# Patient Record
Sex: Female | Born: 1945 | Race: Black or African American | Hispanic: No | Marital: Single | Smoking: Former smoker
Health system: Southern US, Community
[De-identification: ages and names within clinical notes are randomized; demographics above are authoritative.]

## PROBLEM LIST (undated history)

## (undated) DIAGNOSIS — M109 Gout, unspecified: Secondary | ICD-10-CM

## (undated) DIAGNOSIS — R569 Unspecified convulsions: Secondary | ICD-10-CM

## (undated) DIAGNOSIS — H409 Unspecified glaucoma: Secondary | ICD-10-CM

## (undated) DIAGNOSIS — E119 Type 2 diabetes mellitus without complications: Secondary | ICD-10-CM

## (undated) DIAGNOSIS — G709 Myoneural disorder, unspecified: Secondary | ICD-10-CM

## (undated) DIAGNOSIS — Z8619 Personal history of other infectious and parasitic diseases: Secondary | ICD-10-CM

## (undated) DIAGNOSIS — C801 Malignant (primary) neoplasm, unspecified: Secondary | ICD-10-CM

## (undated) DIAGNOSIS — G808 Other cerebral palsy: Secondary | ICD-10-CM

## (undated) DIAGNOSIS — I10 Essential (primary) hypertension: Secondary | ICD-10-CM

## (undated) DIAGNOSIS — Z9289 Personal history of other medical treatment: Secondary | ICD-10-CM

## (undated) DIAGNOSIS — M1990 Unspecified osteoarthritis, unspecified site: Secondary | ICD-10-CM

## (undated) HISTORY — PX: TUBAL LIGATION: SHX77

## (undated) HISTORY — PX: EYE SURGERY: SHX253

## (undated) HISTORY — PX: BREAST SURGERY: SHX581

## (undated) HISTORY — PX: CHOLECYSTECTOMY: SHX55

## (undated) HISTORY — PX: OTHER SURGICAL HISTORY: SHX169

## (undated) HISTORY — DX: Unspecified convulsions: R56.9

---

## 2002-09-08 ENCOUNTER — Emergency Department (HOSPITAL_COMMUNITY): Admission: EM | Admit: 2002-09-08 | Discharge: 2002-09-08 | Payer: Self-pay | Admitting: Emergency Medicine

## 2004-09-26 ENCOUNTER — Emergency Department (HOSPITAL_COMMUNITY): Admission: EM | Admit: 2004-09-26 | Discharge: 2004-09-26 | Payer: Self-pay | Admitting: Family Medicine

## 2005-03-20 ENCOUNTER — Ambulatory Visit (HOSPITAL_COMMUNITY): Admission: RE | Admit: 2005-03-20 | Discharge: 2005-03-20 | Payer: Self-pay | Admitting: Family Medicine

## 2005-03-20 ENCOUNTER — Emergency Department (HOSPITAL_COMMUNITY): Admission: EM | Admit: 2005-03-20 | Discharge: 2005-03-20 | Payer: Self-pay | Admitting: Family Medicine

## 2005-04-26 ENCOUNTER — Emergency Department (HOSPITAL_COMMUNITY): Admission: EM | Admit: 2005-04-26 | Discharge: 2005-04-26 | Payer: Self-pay | Admitting: Family Medicine

## 2006-12-01 ENCOUNTER — Emergency Department (HOSPITAL_COMMUNITY): Admission: EM | Admit: 2006-12-01 | Discharge: 2006-12-01 | Payer: Self-pay | Admitting: Emergency Medicine

## 2007-01-03 ENCOUNTER — Other Ambulatory Visit: Payer: Self-pay | Admitting: Emergency Medicine

## 2007-01-03 ENCOUNTER — Encounter (INDEPENDENT_AMBULATORY_CARE_PROVIDER_SITE_OTHER): Payer: Self-pay | Admitting: Otolaryngology

## 2007-01-03 ENCOUNTER — Emergency Department (HOSPITAL_COMMUNITY): Admission: EM | Admit: 2007-01-03 | Discharge: 2007-01-03 | Payer: Self-pay | Admitting: Emergency Medicine

## 2010-06-19 ENCOUNTER — Other Ambulatory Visit: Payer: Self-pay | Admitting: Internal Medicine

## 2010-06-19 DIAGNOSIS — I890 Lymphedema, not elsewhere classified: Secondary | ICD-10-CM

## 2010-06-21 ENCOUNTER — Ambulatory Visit
Admission: RE | Admit: 2010-06-21 | Discharge: 2010-06-21 | Disposition: A | Discharge status: Home / Self Care | Payer: Medicare Other | Source: Ambulatory Visit | Attending: Internal Medicine | Admitting: Internal Medicine

## 2010-06-21 DIAGNOSIS — I890 Lymphedema, not elsewhere classified: Secondary | ICD-10-CM

## 2010-08-07 NOTE — Consult Note (Signed)
NAME:  Donna Becker, Donna Becker NO.:  [id number]   MEDICAL RECORD NO.:  [id number]          PATIENT TYPE:  OUT   LOCATION:  CATS                         FACILITY:  [facility]   PHYSICIAN:  Zola Button T. Lazarus Salines, M.D. DATE OF BIRTH:  09-Jun-1945   DATE OF CONSULTATION:  01/03/2007  DATE OF DISCHARGE:                                 CONSULTATION   CHIEF COMPLAINT:  Palatal mass.   HISTORY:  This 65 year old black female noticed a small lump come up on  the roof of her mouth may be greater than 1 week ago.  It has gotten  gradually larger.  It is quite tender.  It feels soft.  Yesterday or  today, she has some sense that it might have popped to some degree.  It  is quite painful, but no fever.  No nasal drainage.  She has noticed  some pain and looseness in her left maxillary teeth.  Prior to this, she  has had multiple dental reconstructions but nothing currently actively  diseased.  No numbness on her face.  No vision or hearing problems.  No  fever.  She is diabetic but has been under good control in the mid 100  range.  No trauma to the area.   PHYSICAL EXAMINATION:  GENERAL:  This is a pleasant middle-aged black  female who looks somewhat uncomfortable.  She has a strong anaerobic  smell to her breath.  NEUROLOGIC:  Mental status seems appropriate.  She hears well in  conversational speech.  Voice is clear and respirations unlabored  through the nose.  The head is atraumatic and neck supple.  Cranial  nerves grossly intact.  HEENT:  Ear canals are clear with normal aerated drums.  The internal  nose is mildly dry, but no crusting drainage or masses.  The oral cavity  reveals multiple teeth with reconstructions.  There is a slightly  lobulated 2 x 1.5 cm hemispherical fluctuant fullness in the midline  hard palate, more towards the left than the right.  There is no active  drainage, although there is some surface excoriation probably from the  prior attempt at needle aspiration.   Interestingly, the entire left  maxillary alveolus up to the level of the canine teeth is mobile.  Oropharynx is clear.  I did not examine nasopharynx or hypopharynx.  NECK:  Unremarkable.   IMPRESSION:  1. Midline palatal fluctuant mass with anaerobic odor and mobile left      maxillary alveolus suggesting some sort of infection but a concern      for possible maxillary tumor.  2. Diabetes.  3. Hypertension.   PLAN:  We will check a CBC with differential, BMET 7.  We will check and  axial plus coronal CT of the paranasal sinuses now to better assess the  limits of this process.  It will probably require incision and drainage,  possibly more than that.      Gloris Manchester. Lazarus Salines, M.D.  Electronically Signed     KTW/MEDQ  D:  01/03/2007  T:  01/04/2007  Job:  [id number]

## 2010-08-07 NOTE — Op Note (Signed)
NAME:  Donna Becker, Donna Becker NO.:  [id number]   MEDICAL RECORD NO.:  [id number]          PATIENT TYPE:  OUT   LOCATION:  CATS                         FACILITY:  [facility]   PHYSICIAN:  Zola Button T. Lazarus Salines, M.D. DATE OF BIRTH:  Nov 24, 1945   DATE OF PROCEDURE:  01/03/2007  DATE OF DISCHARGE:  01/03/2007                               OPERATIVE REPORT   PREOPERATIVE DIAGNOSIS:  1. Scarred  palate.  2. Dental abscess.   POSTOPERATIVE DIAGNOSES:  1. Scarred  palate.  2. Dental abscess.   PROCEDURE PERFORMED:  Incision and drainage/incisional biopsy, hard  palate cystic structure   SURGEON:  Karol T. Lazarus Salines, M.D.   ANESTHESIA:  Local.   BLOOD LOSS:  Minimal.   COMPLICATIONS:  None.   FINDINGS:  A roughly 1.5 x 2 cm fluctuant, soft swelling in the anterior  hard palate submucosally, predominately to the left of midline.  On  incision, a sheet cavity with some flecks of pus expressed in the region  of the alveolus but no frank drainage of pus.  Cultures taken for  routine culture.  The wedge incisional biopsy for histopathology.   DESCRIPTION OF PROCEDURE:  With the patient in the comfortable semi-  sitting position, topical anesthesia was achieved with Cetacaine spray  x2.  Following this, 1% Xylocaine with 1:100,000 epinephrine, 4 mL total  was infiltrated submucosally.  Several minutes were allowed for this to  take effect.  The cavity was incised with the 15 blade. A 1 cm incision  and some blood but no free pus identified.  With expression near the  alveolus, small flecks of pus were evacuated.  Culture was placed in the  cavity.  Small wedge biopsy of the edge of the in incision was taken for  routine histopathology.  Hemostasis was spontaneous.  The patient  tolerated the procedure nicely.   She will receive 900 mg of IV clindamycin in the ER and I will send her  home with a prescription for clindamycin 300 mg t.i.d. and Vicodin for  pain relief.  She needs to  see a dentist in the immediate future.  Donna Becker, D.D.S.  is on call for the dental service per the emergency room.  I will see  her back if needed in my office.  She understands and agrees.   Cc:  Dr. Alesia Banda DDS      Gloris Manchester. Lazarus Salines, M.D.  Electronically Signed     KTW/MEDQ  D:  01/03/2007  T:  01/05/2007  Job:  [id number]   cc:   Donna Hacking. Becker, D.D.S.

## 2010-12-27 ENCOUNTER — Emergency Department (HOSPITAL_COMMUNITY): Payer: Medicare Other

## 2010-12-27 ENCOUNTER — Inpatient Hospital Stay (HOSPITAL_COMMUNITY)
Admission: EM | Admit: 2010-12-27 | Discharge: 2011-01-04 | DRG: 558 | Disposition: A | Discharge status: Rehabilitation Facility | Payer: Medicare Other | Source: Ambulatory Visit | Attending: Internal Medicine | Admitting: Internal Medicine

## 2010-12-27 DIAGNOSIS — Z9010 Acquired absence of unspecified breast and nipple: Secondary | ICD-10-CM

## 2010-12-27 DIAGNOSIS — R4789 Other speech disturbances: Secondary | ICD-10-CM | POA: Diagnosis present

## 2010-12-27 DIAGNOSIS — Z886 Allergy status to analgesic agent status: Secondary | ICD-10-CM

## 2010-12-27 DIAGNOSIS — Z7902 Long term (current) use of antithrombotics/antiplatelets: Secondary | ICD-10-CM

## 2010-12-27 DIAGNOSIS — Z9181 History of falling: Secondary | ICD-10-CM

## 2010-12-27 DIAGNOSIS — Z833 Family history of diabetes mellitus: Secondary | ICD-10-CM

## 2010-12-27 DIAGNOSIS — I959 Hypotension, unspecified: Secondary | ICD-10-CM | POA: Diagnosis not present

## 2010-12-27 DIAGNOSIS — M109 Gout, unspecified: Secondary | ICD-10-CM | POA: Diagnosis present

## 2010-12-27 DIAGNOSIS — E785 Hyperlipidemia, unspecified: Secondary | ICD-10-CM | POA: Diagnosis present

## 2010-12-27 DIAGNOSIS — G40909 Epilepsy, unspecified, not intractable, without status epilepticus: Secondary | ICD-10-CM | POA: Diagnosis present

## 2010-12-27 DIAGNOSIS — R1310 Dysphagia, unspecified: Secondary | ICD-10-CM | POA: Diagnosis present

## 2010-12-27 DIAGNOSIS — G809 Cerebral palsy, unspecified: Secondary | ICD-10-CM | POA: Diagnosis present

## 2010-12-27 DIAGNOSIS — I10 Essential (primary) hypertension: Secondary | ICD-10-CM | POA: Diagnosis present

## 2010-12-27 DIAGNOSIS — Z947 Corneal transplant status: Secondary | ICD-10-CM

## 2010-12-27 DIAGNOSIS — Z8249 Family history of ischemic heart disease and other diseases of the circulatory system: Secondary | ICD-10-CM

## 2010-12-27 DIAGNOSIS — M75120 Complete rotator cuff tear or rupture of unspecified shoulder, not specified as traumatic: Principal | ICD-10-CM | POA: Diagnosis present

## 2010-12-27 DIAGNOSIS — M25519 Pain in unspecified shoulder: Secondary | ICD-10-CM | POA: Diagnosis present

## 2010-12-27 DIAGNOSIS — Z79899 Other long term (current) drug therapy: Secondary | ICD-10-CM

## 2010-12-27 DIAGNOSIS — E119 Type 2 diabetes mellitus without complications: Secondary | ICD-10-CM | POA: Diagnosis present

## 2010-12-27 DIAGNOSIS — Z48298 Encounter for aftercare following other organ transplant: Secondary | ICD-10-CM

## 2010-12-27 DIAGNOSIS — Z7982 Long term (current) use of aspirin: Secondary | ICD-10-CM

## 2010-12-27 DIAGNOSIS — Z853 Personal history of malignant neoplasm of breast: Secondary | ICD-10-CM

## 2010-12-27 DIAGNOSIS — G8190 Hemiplegia, unspecified affecting unspecified side: Secondary | ICD-10-CM | POA: Diagnosis present

## 2010-12-27 LAB — CBC
HCT: 33.9 % — ABNORMAL LOW (ref 36.0–46.0)
Hemoglobin: 11.4 g/dL — ABNORMAL LOW (ref 12.0–15.0)
MCH: 31.1 pg (ref 26.0–34.0)
MCHC: 33.6 g/dL (ref 30.0–36.0)
MCV: 92.6 fL (ref 78.0–100.0)
Platelets: 180 10*3/uL (ref 150–400)
RBC: 3.66 MIL/uL — ABNORMAL LOW (ref 3.87–5.11)
RDW: 13.5 % (ref 11.5–15.5)
WBC: 6 10*3/uL (ref 4.0–10.5)

## 2010-12-27 LAB — COMPREHENSIVE METABOLIC PANEL
ALT: 26 U/L (ref 0–35)
AST: 24 U/L (ref 0–37)
Albumin: 3.8 g/dL (ref 3.5–5.2)
Alkaline Phosphatase: 113 U/L (ref 39–117)
BUN: 32 mg/dL — ABNORMAL HIGH (ref 6–23)
CO2: 23 mEq/L (ref 19–32)
Calcium: 9.2 mg/dL (ref 8.4–10.5)
Chloride: 105 mEq/L (ref 96–112)
Creatinine, Ser: 0.81 mg/dL (ref 0.50–1.10)
GFR calc Af Amer: 86 mL/min — ABNORMAL LOW (ref >90)
GFR calc non Af Amer: 74 mL/min — ABNORMAL LOW (ref >90)
Glucose, Bld: 93 mg/dL (ref 70–99)
Potassium: 4.3 mEq/L (ref 3.5–5.1)
Sodium: 139 mEq/L (ref 135–145)
Total Bilirubin: 0.2 mg/dL — ABNORMAL LOW (ref 0.3–1.2)
Total Protein: 7.9 g/dL (ref 6.0–8.3)

## 2010-12-27 LAB — CK TOTAL AND CKMB (NOT AT ARMC)
CK, MB: 3 ng/mL (ref 0.3–4.0)
Relative Index: 2.6 — ABNORMAL HIGH (ref 0.0–2.5)
Total CK: 116 U/L (ref 7–177)

## 2010-12-27 LAB — PROTIME-INR
INR: 1.06 (ref 0.00–1.49)
Prothrombin Time: 14 seconds (ref 11.6–15.2)

## 2010-12-27 LAB — TROPONIN I: Troponin I: <0.3 ng/mL (ref <0.30)

## 2010-12-27 LAB — DIFFERENTIAL
Basophils Absolute: 0 10*3/uL (ref 0.0–0.1)
Basophils Relative: 0 % (ref 0–1)
Eosinophils Absolute: 0.2 10*3/uL (ref 0.0–0.7)
Eosinophils Relative: 3 % (ref 0–5)
Lymphocytes Relative: 33 % (ref 12–46)
Lymphs Abs: 2 10*3/uL (ref 0.7–4.0)
Monocytes Absolute: 0.5 10*3/uL (ref 0.1–1.0)
Monocytes Relative: 8 % (ref 3–12)
Neutro Abs: 3.3 10*3/uL (ref 1.7–7.7)
Neutrophils Relative %: 56 % (ref 43–77)

## 2010-12-27 LAB — POCT I-STAT, CHEM 8
BUN: 35 mg/dL — ABNORMAL HIGH (ref 6–23)
Calcium, Ion: 1.14 mmol/L (ref 1.12–1.32)
Chloride: 104 mEq/L (ref 96–112)
Creatinine, Ser: 0.9 mg/dL (ref 0.50–1.10)
Glucose, Bld: 96 mg/dL (ref 70–99)
HCT: 38 % (ref 36.0–46.0)
Hemoglobin: 12.9 g/dL (ref 12.0–15.0)
Potassium: 4 mEq/L (ref 3.5–5.1)
Sodium: 143 mEq/L (ref 135–145)
TCO2: 26 mmol/L (ref 0–100)

## 2010-12-27 LAB — CARBAMAZEPINE LEVEL, TOTAL: Carbamazepine Lvl: 6 ug/mL (ref 4.0–12.0)

## 2010-12-27 LAB — APTT: aPTT: 27 seconds (ref 24–37)

## 2010-12-27 LAB — GLUCOSE, CAPILLARY: Glucose-Capillary: 92 mg/dL (ref 70–99)

## 2010-12-28 ENCOUNTER — Inpatient Hospital Stay (HOSPITAL_COMMUNITY): Payer: Medicare Other

## 2010-12-28 DIAGNOSIS — I369 Nonrheumatic tricuspid valve disorder, unspecified: Secondary | ICD-10-CM

## 2010-12-28 LAB — CARDIAC PANEL(CRET KIN+CKTOT+MB+TROPI)
CK, MB: 2.5 ng/mL (ref 0.3–4.0)
Relative Index: INVALID (ref 0.0–2.5)
Total CK: 90 U/L (ref 7–177)
Troponin I: <0.3 ng/mL (ref <0.30)

## 2010-12-28 LAB — GLUCOSE, CAPILLARY
Glucose-Capillary: 131 mg/dL — ABNORMAL HIGH (ref 70–99)
Glucose-Capillary: 116 mg/dL — ABNORMAL HIGH (ref 70–99)
Glucose-Capillary: 127 mg/dL — ABNORMAL HIGH (ref 70–99)
Glucose-Capillary: 123 mg/dL — ABNORMAL HIGH (ref 70–99)

## 2010-12-28 LAB — CBC
HCT: 30.5 % — ABNORMAL LOW (ref 36.0–46.0)
Hemoglobin: 10.1 g/dL — ABNORMAL LOW (ref 12.0–15.0)
MCH: 30.8 pg (ref 26.0–34.0)
MCHC: 33.1 g/dL (ref 30.0–36.0)
MCV: 93 fL (ref 78.0–100.0)
Platelets: 193 10*3/uL (ref 150–400)
RBC: 3.28 MIL/uL — ABNORMAL LOW (ref 3.87–5.11)
RDW: 13.5 % (ref 11.5–15.5)
WBC: 4.2 10*3/uL (ref 4.0–10.5)

## 2010-12-28 LAB — HEMOGLOBIN A1C
Hgb A1c MFr Bld: 6.5 % — ABNORMAL HIGH (ref <5.7)
Mean Plasma Glucose: 140 mg/dL — ABNORMAL HIGH (ref <117)

## 2010-12-28 LAB — COMPREHENSIVE METABOLIC PANEL
ALT: 24 U/L (ref 0–35)
AST: 19 U/L (ref 0–37)
Albumin: 3.5 g/dL (ref 3.5–5.2)
Alkaline Phosphatase: 114 U/L (ref 39–117)
BUN: 27 mg/dL — ABNORMAL HIGH (ref 6–23)
CO2: 28 mEq/L (ref 19–32)
Calcium: 9.6 mg/dL (ref 8.4–10.5)
Chloride: 103 mEq/L (ref 96–112)
Creatinine, Ser: 0.74 mg/dL (ref 0.50–1.10)
GFR calc Af Amer: >90 mL/min (ref >90)
GFR calc non Af Amer: 87 mL/min — ABNORMAL LOW (ref >90)
Glucose, Bld: 127 mg/dL — ABNORMAL HIGH (ref 70–99)
Potassium: 3.5 mEq/L (ref 3.5–5.1)
Sodium: 142 mEq/L (ref 135–145)
Total Bilirubin: 0.3 mg/dL (ref 0.3–1.2)
Total Protein: 7.7 g/dL (ref 6.0–8.3)

## 2010-12-28 LAB — LIPID PANEL
Cholesterol: 150 mg/dL (ref 0–200)
HDL: 47 mg/dL (ref >39)
LDL Cholesterol: 84 mg/dL (ref 0–99)
Total CHOL/HDL Ratio: 3.2 RATIO
Triglycerides: 97 mg/dL (ref <150)
VLDL: 19 mg/dL (ref 0–40)

## 2010-12-28 LAB — PROTIME-INR
INR: 1.09 (ref 0.00–1.49)
Prothrombin Time: 14.3 seconds (ref 11.6–15.2)

## 2010-12-28 LAB — APTT: aPTT: 26 seconds (ref 24–37)

## 2010-12-28 MED ORDER — GADOBENATE DIMEGLUMINE 529 MG/ML IV SOLN
20.0000 mL | Freq: Once | INTRAVENOUS | Status: AC
  Administered 2010-12-28: 20 mL via INTRAVENOUS

## 2010-12-29 ENCOUNTER — Inpatient Hospital Stay (HOSPITAL_COMMUNITY): Payer: Medicare Other

## 2010-12-29 LAB — GLUCOSE, CAPILLARY
Glucose-Capillary: 84 mg/dL (ref 70–99)
Glucose-Capillary: 104 mg/dL — ABNORMAL HIGH (ref 70–99)
Glucose-Capillary: 83 mg/dL (ref 70–99)

## 2010-12-30 ENCOUNTER — Inpatient Hospital Stay (HOSPITAL_COMMUNITY): Payer: Medicare Other

## 2010-12-30 DIAGNOSIS — M7989 Other specified soft tissue disorders: Secondary | ICD-10-CM

## 2010-12-30 LAB — GLUCOSE, CAPILLARY
Glucose-Capillary: 127 mg/dL — ABNORMAL HIGH (ref 70–99)
Glucose-Capillary: 89 mg/dL (ref 70–99)
Glucose-Capillary: 107 mg/dL — ABNORMAL HIGH (ref 70–99)
Glucose-Capillary: 127 mg/dL — ABNORMAL HIGH (ref 70–99)

## 2010-12-30 MED ORDER — IOHEXOL 300 MG/ML  SOLN
75.0000 mL | Freq: Once | INTRAMUSCULAR | Status: AC | PRN
  Administered 2010-12-30: 75 mL via INTRAVENOUS

## 2010-12-31 ENCOUNTER — Inpatient Hospital Stay (HOSPITAL_COMMUNITY): Payer: Medicare Other

## 2010-12-31 LAB — GLUCOSE, CAPILLARY
Glucose-Capillary: 76 mg/dL (ref 70–99)
Glucose-Capillary: 153 mg/dL — ABNORMAL HIGH (ref 70–99)
Glucose-Capillary: 155 mg/dL — ABNORMAL HIGH (ref 70–99)

## 2011-01-01 LAB — FOLATE: Folate: 6.5 ng/mL

## 2011-01-01 LAB — VITAMIN B12: Vitamin B-12: 557 pg/mL (ref 211–911)

## 2011-01-01 LAB — RPR: RPR Ser Ql: NONREACTIVE

## 2011-01-01 LAB — GLUCOSE, CAPILLARY
Glucose-Capillary: 106 mg/dL — ABNORMAL HIGH (ref 70–99)
Glucose-Capillary: 135 mg/dL — ABNORMAL HIGH (ref 70–99)
Glucose-Capillary: 94 mg/dL (ref 70–99)
Glucose-Capillary: 157 mg/dL — ABNORMAL HIGH (ref 70–99)

## 2011-01-01 LAB — TSH: TSH: 3.566 u[IU]/mL (ref 0.350–4.500)

## 2011-01-01 NOTE — Consult Note (Signed)
NAME:  LANEY, LOUDERBACK NO.:  [id number]  MEDICAL RECORD NO.:  [id number]  LOCATION:  3018                         FACILITY:  [facility]  PHYSICIAN:  Marlan Palau, M.D.  DATE OF BIRTH:  [birth year]  DATE OF CONSULTATION:  12/28/2010 DATE OF DISCHARGE:                                CONSULTATION   HISTORY OF PRESENT ILLNESS:  Donna Becker is a 65 year old left- handed black female born 09-04-1945, with a history of cerebral palsy with right hemiparesis.  This patient has diabetes and hypertension as well.  The patient has noted onset of left neck pain and left-sided headache that began 4 days prior to admission.  The patient has had persistent problems with the pain and has noted some difficulty with some slight left-sided weakness.  The patient awakened on the morning of admission noting that the left arm was particularly weak and she had some difficulty walking, but was able to walk to the door to let her sister in.  The patient notes that when she turns her head to the left, she has pain down the left arm to the hand.  The patient has significant pain into the left shoulder and neck area.  The patient believes that the left leg has gotten weaker.  The patient denies any visual field disturbances, although she had some blurring of vision earlier in the week.  The patient was brought in for suspected stroke, but am MRI study of the brain is unremarkable showing only the encephalomalacia in the left posterior parietal and temporal regions associated with her cerebral palsy deficit and MRA evaluation of the head and neck are relatively unremarkable.  Carotid Doppler study is normal.  Neurology was asked to see the patient for further evaluation.  PAST MEDICAL HISTORY:  Significant for: 1. History of cerebral palsy. 2. History of new onset left-sided neck and shoulder pain, some     weakness or pain involving the left arm, possible weakness in the     left  leg.  MRI evaluation negative. 3. Diabetes. 4. Obesity. 5. Hypertension. 6. Multiple procedures of the right arm and right leg for tendon     transfers secondary to cerebral palsy, rule ankle surgery. 7. Breast cancer, status post left mastectomy. 8. Bilateral cataract surgery. 9. Gallbladder resection. 10.Glaucoma. 11.Obesity. 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 13.The patient also has a history of seizures.  The patient has allergy to CODEINE which causes rashes and the patient does not smoke or drink.  CURRENT MEDICATIONS: 1. Allopurinol for gout 100 mg daily. 2. Aspirin 325 mg daily. 3. Tegretol 200 mg 3 times daily. 4. Plavix 75 mg daily. 5. Colchicine 0.6 mg daily. 6. Cyclobenzaprine 10 mg 3 times daily. 7. Gabapentin 300 mg 3 times daily. 8. Glucotrol 10 mg at night. 9. Keppra 500 mg 3 daily. 10.Lisinopril 40 mg daily. 11.Mobic 15 mg daily. 12.Glucophage 1000 mg at night. 13.Metoprolol 100 mg twice daily. 14.Morphine if needed. 15.Simvastatin 20 mg daily.  SOCIAL HISTORY:  This patient is living at home with her mother.  She is not married.  She takes care of her mother who is 58 years old.  The patient lives in  the [place] area.  She is not otherwise employed.  FAMILY MEDICAL HISTORY:  Notable that mother is still living, has a history of degenerative arthritis, mild memory problems.  Father died with dementia.  The patient has one brother and two sisters. K One sister has hypotension and degenerative arthritis and other sister has hypertension.  REVIEW OF SYSTEMS:  Notable for no recent fevers, does have some chills. The patient notes some left-sided headache, neck pain, decreased appetite dizziness, some shortness of breath.  Denies chest pains.  Has had some urinary incontinence problems.  The patient denies any falls or blackout episodes.  PHYSICAL EXAMINATION:  VITAL SIGNS:  Blood pressure is 111/69, heart rate 80, respiratory rate  19, and temperature afebrile. GENERAL:  This patient is a moderately black female who is alert and cooperative at the time of examination. HEENT:  Head is atraumatic.  Eyes, pupils are equal, round, and reactive to light. NECK:  Supple.  No carotid bruits noted. RESPIRATORY:  Clear. CARDIOVASCULAR:  Regular rate and rhythm.  No obvious murmurs or rubs noted. EXTREMITIES:  Without significant edema at the ankles.  The patient has edema of the left arm. NEUROLOGIC:  Cranial nerves as above.  Facial symmetry is present.  The patient has decreased pinprick sensation on the left face as compared to the right.  Does not split midline with vibratory sensation.  Again, extraocular movements are full, visual fields are full.  Speech is normal.  No aphasia or dysarthria noted.  Motor test reveals poor effort with the left arm in part secondary to severe pain with passive and active movement and elevation of the arm.  The patient does have some grip strength, ability to extend, flex at the elbow, has significant pain with elevation of the arm.  The patient has inability to keep the left leg up off the bed, drifts down to the bed, has good abductor strength and abductor strength bilaterally.  Decreased dorsiflexion on left foot as per the right, mild drift noted with the right leg.  The patient was not ambulated.  Deep tendon reflexes are depressed, but symmetric.  Toes are neutral bilaterally.  The patient has fair heel-to- shin bilaterally.  Has difficulty performing finger-nose-finger secondary to pain with the left arm, is able to form on the right.  Some ataxia was noted with the right arm.  Pinprick sensation is decreased on the left arm and legs as compared to the right.  Vibratory sensation is symmetric in the arms, decreased on the right leg as compared to the left in the legs.  LABORATORY VALUES:  Notable for a white count of 6.0, hemoglobin of 11.4, hematocrit 33.9, MCV of 92.6, and  platelets of 180.  INR of 1.06. Sodium of 139, potassium 4.3, chloride of 105, CO2 of 23, glucose of 93, BUN of 32, creatinine 0.81, total bili 0.2, alk phosphatase of 113, SGOT of 24, SGPT of 26, total protein 7.9, albumin of 3.8, calcium 9.2.  CK of 116, MB fraction 3, and troponin I less than 0.3.  Carbamazepine level 6.  MRI of the brain is as above.  IMPRESSION: 1. Onset of left-sided shoulder pain, neck pain, headache, left arm     discomfort, and weakness. 2. History of cerebral palsy with right-sided weakness. 3. History of seizures.  This patient has presentation with headache and pain, notes pain down the left arm with turning her head to the left.  MRI evaluation of the head with MRA of the head is  unremarkable.  No evidence of stroke is noted.  Given the pain syndrome, the patient will be scheduled for a MRI of the cervical spine looking for cervical disk.  The possibility of a left rotator cuff tear also needs to be considered, but it is less likely.  We will follow the patient's clinical course while in-house. Physical and Occupational Therapy will follow the patient.     Marlan Palau, M.D.     CKW/MEDQ  D:  12/28/2010  T:  12/28/2010  Job:  [id number]  cc:   Deirdre Peer. Polite, M.D. [facility] Electronically Signed by Thana Farr M.D. on 01/01/2011 04:54:09 PM

## 2011-01-02 ENCOUNTER — Inpatient Hospital Stay (HOSPITAL_COMMUNITY): Payer: Medicare Other

## 2011-01-02 DIAGNOSIS — G809 Cerebral palsy, unspecified: Secondary | ICD-10-CM

## 2011-01-02 DIAGNOSIS — M7530 Calcific tendinitis of unspecified shoulder: Secondary | ICD-10-CM

## 2011-01-02 DIAGNOSIS — M7989 Other specified soft tissue disorders: Secondary | ICD-10-CM

## 2011-01-02 LAB — GLUCOSE, CAPILLARY
Glucose-Capillary: 81 mg/dL (ref 70–99)
Glucose-Capillary: 92 mg/dL (ref 70–99)
Glucose-Capillary: 138 mg/dL — ABNORMAL HIGH (ref 70–99)
Glucose-Capillary: 258 mg/dL — ABNORMAL HIGH (ref 70–99)

## 2011-01-03 ENCOUNTER — Inpatient Hospital Stay (HOSPITAL_COMMUNITY): Payer: Medicare Other

## 2011-01-03 DIAGNOSIS — R5381 Other malaise: Secondary | ICD-10-CM

## 2011-01-03 DIAGNOSIS — M48061 Spinal stenosis, lumbar region without neurogenic claudication: Secondary | ICD-10-CM

## 2011-01-03 DIAGNOSIS — Z5189 Encounter for other specified aftercare: Secondary | ICD-10-CM

## 2011-01-03 LAB — CBC
HCT: 34.4 — ABNORMAL LOW
Hemoglobin: 11.7 — ABNORMAL LOW
MCHC: 33.9
MCV: 90.4
Platelets: 229
RBC: 3.81 — ABNORMAL LOW
RDW: 13.6
WBC: 10.8 — ABNORMAL HIGH

## 2011-01-03 LAB — CULTURE, ROUTINE-ABSCESS: Culture: NORMAL

## 2011-01-03 LAB — BASIC METABOLIC PANEL
BUN: 13
CO2: 25
Calcium: 8.6
Chloride: 102
Creatinine, Ser: 0.66
GFR calc Af Amer: >60
GFR calc non Af Amer: >60
Glucose, Bld: 96
Potassium: 3.8
Sodium: 138

## 2011-01-03 LAB — GLUCOSE, CAPILLARY
Glucose-Capillary: 54 mg/dL — ABNORMAL LOW (ref 70–99)
Glucose-Capillary: 65 mg/dL — ABNORMAL LOW (ref 70–99)
Glucose-Capillary: 90 mg/dL (ref 70–99)
Glucose-Capillary: 119 mg/dL — ABNORMAL HIGH (ref 70–99)
Glucose-Capillary: 268 mg/dL — ABNORMAL HIGH (ref 70–99)
Glucose-Capillary: 208 mg/dL — ABNORMAL HIGH (ref 70–99)

## 2011-01-03 LAB — DIFFERENTIAL
Basophils Absolute: 0
Basophils Relative: 0
Eosinophils Absolute: 0
Eosinophils Relative: 0
Lymphocytes Relative: 11 — ABNORMAL LOW
Lymphs Abs: 1.2
Monocytes Absolute: 0.7
Monocytes Relative: 6
Neutro Abs: 8.8 — ABNORMAL HIGH
Neutrophils Relative %: 82 — ABNORMAL HIGH

## 2011-01-04 ENCOUNTER — Inpatient Hospital Stay (HOSPITAL_COMMUNITY)
Admission: RE | Admit: 2011-01-04 | Discharge: 2011-01-15 | DRG: 945 | Disposition: A | Discharge status: Home Health | Payer: Medicare Other | Source: Other Acute Inpatient Hospital | Attending: Physical Medicine & Rehabilitation | Admitting: Physical Medicine & Rehabilitation

## 2011-01-04 DIAGNOSIS — I10 Essential (primary) hypertension: Secondary | ICD-10-CM

## 2011-01-04 DIAGNOSIS — E1142 Type 2 diabetes mellitus with diabetic polyneuropathy: Secondary | ICD-10-CM

## 2011-01-04 DIAGNOSIS — Z853 Personal history of malignant neoplasm of breast: Secondary | ICD-10-CM

## 2011-01-04 DIAGNOSIS — I890 Lymphedema, not elsewhere classified: Secondary | ICD-10-CM

## 2011-01-04 DIAGNOSIS — M109 Gout, unspecified: Secondary | ICD-10-CM

## 2011-01-04 DIAGNOSIS — D1779 Benign lipomatous neoplasm of other sites: Secondary | ICD-10-CM

## 2011-01-04 DIAGNOSIS — M67919 Unspecified disorder of synovium and tendon, unspecified shoulder: Secondary | ICD-10-CM

## 2011-01-04 DIAGNOSIS — Z5189 Encounter for other specified aftercare: Principal | ICD-10-CM

## 2011-01-04 DIAGNOSIS — M48061 Spinal stenosis, lumbar region without neurogenic claudication: Secondary | ICD-10-CM

## 2011-01-04 DIAGNOSIS — G40909 Epilepsy, unspecified, not intractable, without status epilepticus: Secondary | ICD-10-CM

## 2011-01-04 DIAGNOSIS — R5381 Other malaise: Secondary | ICD-10-CM

## 2011-01-04 DIAGNOSIS — N390 Urinary tract infection, site not specified: Secondary | ICD-10-CM

## 2011-01-04 DIAGNOSIS — G808 Other cerebral palsy: Secondary | ICD-10-CM

## 2011-01-04 DIAGNOSIS — S8000XA Contusion of unspecified knee, initial encounter: Secondary | ICD-10-CM

## 2011-01-04 DIAGNOSIS — M719 Bursopathy, unspecified: Secondary | ICD-10-CM

## 2011-01-04 DIAGNOSIS — E669 Obesity, unspecified: Secondary | ICD-10-CM

## 2011-01-04 DIAGNOSIS — Z79899 Other long term (current) drug therapy: Secondary | ICD-10-CM

## 2011-01-04 DIAGNOSIS — Z8673 Personal history of transient ischemic attack (TIA), and cerebral infarction without residual deficits: Secondary | ICD-10-CM

## 2011-01-04 DIAGNOSIS — M47812 Spondylosis without myelopathy or radiculopathy, cervical region: Secondary | ICD-10-CM

## 2011-01-04 DIAGNOSIS — Z8261 Family history of arthritis: Secondary | ICD-10-CM

## 2011-01-04 DIAGNOSIS — Z8249 Family history of ischemic heart disease and other diseases of the circulatory system: Secondary | ICD-10-CM

## 2011-01-04 DIAGNOSIS — E1149 Type 2 diabetes mellitus with other diabetic neurological complication: Secondary | ICD-10-CM

## 2011-01-04 DIAGNOSIS — W19XXXA Unspecified fall, initial encounter: Secondary | ICD-10-CM

## 2011-01-04 DIAGNOSIS — Y92009 Unspecified place in unspecified non-institutional (private) residence as the place of occurrence of the external cause: Secondary | ICD-10-CM

## 2011-01-04 DIAGNOSIS — A498 Other bacterial infections of unspecified site: Secondary | ICD-10-CM

## 2011-01-04 DIAGNOSIS — S43429A Sprain of unspecified rotator cuff capsule, initial encounter: Secondary | ICD-10-CM

## 2011-01-04 DIAGNOSIS — Z7982 Long term (current) use of aspirin: Secondary | ICD-10-CM

## 2011-01-04 DIAGNOSIS — Z9010 Acquired absence of unspecified breast and nipple: Secondary | ICD-10-CM

## 2011-01-04 DIAGNOSIS — E1169 Type 2 diabetes mellitus with other specified complication: Secondary | ICD-10-CM

## 2011-01-04 DIAGNOSIS — E785 Hyperlipidemia, unspecified: Secondary | ICD-10-CM

## 2011-01-04 LAB — URINALYSIS, MICROSCOPIC ONLY
Bilirubin Urine: NEGATIVE
Glucose, UA: NEGATIVE mg/dL
Hgb urine dipstick: NEGATIVE
Ketones, ur: NEGATIVE mg/dL
Nitrite: NEGATIVE
Protein, ur: NEGATIVE mg/dL
Specific Gravity, Urine: 1.026 (ref 1.005–1.030)
Urobilinogen, UA: 0.2 mg/dL (ref 0.0–1.0)
pH: 5 (ref 5.0–8.0)

## 2011-01-04 LAB — GLUCOSE, CAPILLARY
Glucose-Capillary: 82 mg/dL (ref 70–99)
Glucose-Capillary: 124 mg/dL — ABNORMAL HIGH (ref 70–99)
Glucose-Capillary: 183 mg/dL — ABNORMAL HIGH (ref 70–99)
Glucose-Capillary: 135 mg/dL — ABNORMAL HIGH (ref 70–99)

## 2011-01-04 NOTE — Consult Note (Signed)
NAME:  Donna, Becker NO.:  [id number]  MEDICAL RECORD NO.:  [id number]  LOCATION:  3018                         FACILITY:  [facility]  PHYSICIAN:  Ollen Gross, M.D.    DATE OF BIRTH:  December 26, 1945  DATE OF CONSULTATION: DATE OF DISCHARGE:                                CONSULTATION   REASON FOR CONSULTATION:  Questionable left rotator cuff tear.  PHYSICIAN REQUESTING CONSULTATION:  Deirdre Peer. Polite, MD  HISTORY OF PRESENT ILLNESS:  Donna Becker is a 65 year old female with a history of cerebral palsy who presented to [facility] on December 27, 2010, with onset of left-sided weakness.  Initially, it was felt that she may have had a cerebrovascular incident.  Her workup including Neurology consultation showed that she did not have a stroke.  She had a fall prior to this, landing on her left side and had shoulder pain since then.  She is complaining of pain in the entire arm, worse in the shoulder, but going down to her hand.  She feels as though the pain is still persistent.  She has had a harder time using her arm.  She says that is "her good arm."  She had cerebral palsy and so the right side is weak.  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  She states that the swelling has not gotten any worse.  She does have some numbness in her hand since this episode began.  MEDICAL HISTORY:  Significant for cerebral palsy, type 2 diabetes, hypertension, seizure disorder, history of cellulitis, history of breast cancer, history of lymphedema left upper extremity, and multiple surgeries in the past for her cerebral palsy.  She also has gout and hyperlipidemia.  ALLERGIES:  She is allergic to CODEINE.  CURRENT MEDICATIONS:  Zyrtec, baby aspirin, multivitamins, colchicine, Flexiril, Travatan ophthalmic drops, fish oil, vitamin D, Neurontin, Mobic, lisinopril, glipizide, Keppra, Pravachol, Tegretol,  Lopressor, metformin, and allopurinol.  PHYSICAL EXAMINATION:  GENERAL:  Very pleasant, well-developed female, alert and oriented, in no apparent distress. EXTREMITIES:  Evaluation of the left upper extremity shows that passively, she is near normal range of motion in the shoulder. Actively, she will elevate about 100, abduct about 100, external rotation about 40.  She has edema throughout her left upper extremity. She has normal motion of her elbow and her wrist.  Pulses are intact. Strength is about 4/5 with a rotator cuff testing.  The right upper extremity is afflicted with the cerebral palsy and the exam reflects that.  PLAIN RADIOGRAPHS:  AP and 3 views of the left shoulder showed no fracture.  She had a type 2 acromion.  She had an MRI scan performed, but is listed left shoulder but all the views suggested this MRI scan was performed on her right shoulder.  The images done on the left inverted, all appeared to be the right shoulder.  There is not much soft tissue swelling noted on that MRI scan and no evidence of any vascular clips which is more evidence that it appears that was taken on the right side and not the left.  That side does show a  small supraspinatus tear with minimal retraction.  There is absolutely no edema around and this was an acute injury, such as what we feel possibly had on the left side, we would definitely see edema which is not present on this MRI.  ASSESSMENT:  Left shoulder pain.  We really need to see if that MRI is of the right shoulder and if so, she will need to have one done of the left shoulder to see if this is an acute injury.  She would not be a good candidate at all for surgery given the chronic lymphedema.  She did have a difficult time healing and be high risk for infection.  If there is an acute tear, however, she may end up getting the surgical treatment.  If indeed the MRI was on the correct side, then this tear that I saw is a very  small one, and she might be able to rehab it instead of having a surgery.  I really do not think that MRI is the left shoulder, so would need to get that clarified with Radiology in the morning.  Once again if there if this MRI was accurate, then the plan will be for physical therapy, possibly doing an injection to help with the acute pain.  If it is not accurate, then she will need to have an MRI done of her left shoulder.     Ollen Gross, M.D.     FA/MEDQ  D:  01/02/2011  T:  01/03/2011  Job:  [id number]  cc:   Deirdre Peer. Polite, M.D.  Electronically Signed by Ollen Gross M.D. on 01/04/2011 04:26:57 PM

## 2011-01-05 DIAGNOSIS — R5381 Other malaise: Secondary | ICD-10-CM

## 2011-01-05 DIAGNOSIS — M48061 Spinal stenosis, lumbar region without neurogenic claudication: Secondary | ICD-10-CM

## 2011-01-05 LAB — GLUCOSE, CAPILLARY
Glucose-Capillary: 98 mg/dL (ref 70–99)
Glucose-Capillary: 135 mg/dL — ABNORMAL HIGH (ref 70–99)
Glucose-Capillary: 132 mg/dL — ABNORMAL HIGH (ref 70–99)

## 2011-01-05 LAB — URINE CULTURE
Colony Count: 25000
Culture  Setup Time: 201210121959

## 2011-01-05 NOTE — H&P (Signed)
NAME:  Donna Becker, MACKOWIAK NO.:  [id number]  MEDICAL RECORD NO.:  [id number]  LOCATION:  3018                         FACILITY:  [facility]  PHYSICIAN:  Lonia Blood, M.D.      DATE OF BIRTH:  February 20, 1946  DATE OF ADMISSION:  12/27/2010 DATE OF DISCHARGE:                             HISTORY & PHYSICAL   PRIMARY CARE PHYSICIAN:  Deirdre Peer. Polite, MD  PRESENTING COMPLAINT:  Headache and left-sided weakness.  HISTORY OF PRESENT ILLNESS:  The patient is a 65 year old female with known history of cerebral palsy among other things that presented with sudden onset of left-sided weakness.  She woke up earlier this morning around 10 a.m. with trouble speaking.  She had slurred speech and headache, which was on the left side that was followed by left-sided weakness.  She also felt worsening pain in her left arm and left leg. She was unable to get out of bed and unable to walk.  Her sister came to the door and knocked for a while.  The patient was able to dress herself after prolonged period of time to open the door but during the day, things got worse, so she came to the emergency room.  She was outside the window for t-PA when seen in the emergency room.  The patient is able to give history, but she is slow.  She also reported profound paresthesia involving her left lower extremity.  She denied prior strokes.  Denied any falls or any trauma.  PAST MEDICAL HISTORY:  Significant for: 1. Cerebral palsy. 2. Diabetes type 2. 3. Hypertension. 4. Seizure disorder. 5. History of cellulitis. 6. History of breast cancer, status post chemotherapy. 7. History of mass on her palate. 8. History of multiple surgeries and ligamentous surgeries in relation     to her cerebral palsy. 9. Gout. 10.Hyperlipidemia.  ALLERGIES:  CODEINE.  CURRENT MEDICATIONS: 1. Zyrtec over the counter 1 tablet daily. 2. Aspirin 81 mg daily. 3. Multivitamins over-the-counter 1 tablet daily. 4. Colchicine  0.6 mg 1 tablet daily. 5. Cyclobenzaprine 10 mg t.i.d. 6. Travatan ophthalmic drops 0.004% each eye every evening. 7. Fish oil daily. 8. Vitamin B complex over-the-counter 1 tablet daily. 9. Neurontin 300 mg t.i.d. 10.Meloxicam 15 mg every morning. 11.Lisinopril 40 mg daily. 12.Glipizide 10 mg daily. 13.Keppra 500 mg twice a day. 14.Pravachol 40 mg daily at bedtime. 15.Tegretol 200 mg t.i.d. 16.Metoprolol 100 mg b.i.d. 17.Metformin 500 mg 2 tablets daily at bedtime. 18.Allopurinol 100 mg daily.  SOCIAL HISTORY:  The patient lives at home here in [place] with family.  She denied tobacco, alcohol, or IV drug use.  She normally is able to walk, but has not been able to walk today.  FAMILY HISTORY:  Significant for diabetes and hypertension.  REVIEW OF SYSTEMS:  Mainly, paresthesia in the left lower extremity. Otherwise, all systems reviewed are negative except per HPI.  PHYSICAL EXAMINATION:  VITAL SIGNS:  Temperature is 97.6, blood pressure 139/69 with a pulse of 70, respiratory rate 17, sats 100% on room air. GENERAL:  She is awake, alert, and oriented.  She is able to give history.  She is in no acute distress. HEENT:  PERRL.  EOMI.  No significant pallor.  No jaundice.  No rhinorrhea. NECK:  Supple.  No JVD.  No lymphadenopathy. RESPIRATORY:  Good air entry bilaterally.  No wheezes.  No rales.  No crackles. CARDIOVASCULAR SYSTEM:  S1 and S2.  No murmur. ABDOMEN:  Soft, full, and nontender with positive bowel sounds. EXTREMITIES:  No edema, cyanosis, or clubbing.  She has extreme paresthesias involving the whole lower left lower extremity, pain to both light and deep touch. NEURO:  Cranial nerves II through XII seem to be intact.  There is mild right-sided facial droop.  She has complete left-sided weakness.  Power in the left upper extremity is 3/5, in the left lower extremity is 2/5. She has some hyperreflexia on the right lower extremity with paresthesias.  Gait was  not checked.  Her coordination also seemed to be slightly off.  LABORATORY DATA:  White count 6.0, hemoglobin 11.4 with platelet count of 180, normal differential.  Sodium is 139, potassium 4.3, chloride 105, CO2 of 23, glucose 93, BUN 32, creatinine 0.8.  Total protein 7.9, albumin 3.8 with a calcium of 9.2.  Initial cardiac enzymes are negative with CK 116, MB of 3.0, and negative troponin.  Her Tegretol level is 6.0, which is within normal range.  Head CT without contrast showed microvascular ischemic disease without definite superimposed acute intracranial process.  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  Her chest x-ray is also negative with no evidence of active pulmonary disease.  ASSESSMENT:  This is a 65 year old female presenting with symptoms of acute left-sided stroke.  She has history of cerebral palsy and also diabetic neuropathy.  It is not clear if this is truly a stroke or she has risk factors including diabetes, hypertension, and hyperlipidemia.  PLAN: 1. Acute CVA.  Admit the patient to neuro floor, frequent neuro     checks, check MRI/MRA of the brain, 2-D echo, carotid Dopplers, and     full-stroke workup.  We will do a swallow evaluation.  If she     passes, we will continue with aspirin among other things.  Look for     risk and risk stratification for the patient.  We will get PT, OT,     and ST involved. 2. Diabetes.  Continue with her home medications including sliding     scale insulin. 3. Hypertension.  Again, continue with home medications. 4. Seizure disorder.  I will continue with the Tegretol.  She seems to     have therapeutic levels. 5. History of cerebral palsy.  This is from birth and the patient     seems stable as no evidence of mental retention. 6. History of breast cancer.  She has been treated apparently     including chemo.  She is currently on prednisone. 7. Further treatment will depend on findings from the MRI  and other     tests.     Lonia Blood, M.D.     Verlin Grills  D:  12/28/2010  T:  12/28/2010  Job:  [id number]  Electronically Signed by Lonia Blood M.D. on 01/05/2011 03:28:34 PM

## 2011-01-06 LAB — GLUCOSE, CAPILLARY
Glucose-Capillary: 100 mg/dL — ABNORMAL HIGH (ref 70–99)
Glucose-Capillary: 114 mg/dL — ABNORMAL HIGH (ref 70–99)
Glucose-Capillary: 119 mg/dL — ABNORMAL HIGH (ref 70–99)
Glucose-Capillary: 150 mg/dL — ABNORMAL HIGH (ref 70–99)

## 2011-01-07 DIAGNOSIS — M48061 Spinal stenosis, lumbar region without neurogenic claudication: Secondary | ICD-10-CM

## 2011-01-07 DIAGNOSIS — R5381 Other malaise: Secondary | ICD-10-CM

## 2011-01-07 LAB — CBC
HCT: 31.9 % — ABNORMAL LOW (ref 36.0–46.0)
Hemoglobin: 10.4 g/dL — ABNORMAL LOW (ref 12.0–15.0)
MCH: 30.8 pg (ref 26.0–34.0)
MCHC: 32.6 g/dL (ref 30.0–36.0)
MCV: 94.4 fL (ref 78.0–100.0)
Platelets: 213 10*3/uL (ref 150–400)
RBC: 3.38 MIL/uL — ABNORMAL LOW (ref 3.87–5.11)
RDW: 14 % (ref 11.5–15.5)
WBC: 3.5 10*3/uL — ABNORMAL LOW (ref 4.0–10.5)

## 2011-01-07 LAB — GLUCOSE, CAPILLARY
Glucose-Capillary: 100 mg/dL — ABNORMAL HIGH (ref 70–99)
Glucose-Capillary: 103 mg/dL — ABNORMAL HIGH (ref 70–99)
Glucose-Capillary: 136 mg/dL — ABNORMAL HIGH (ref 70–99)
Glucose-Capillary: 137 mg/dL — ABNORMAL HIGH (ref 70–99)

## 2011-01-07 LAB — COMPREHENSIVE METABOLIC PANEL
ALT: 395 U/L — ABNORMAL HIGH (ref 0–35)
AST: 235 U/L — ABNORMAL HIGH (ref 0–37)
Albumin: 3 g/dL — ABNORMAL LOW (ref 3.5–5.2)
Alkaline Phosphatase: 127 U/L — ABNORMAL HIGH (ref 39–117)
BUN: 41 mg/dL — ABNORMAL HIGH (ref 6–23)
CO2: 30 mEq/L (ref 19–32)
Calcium: 9.3 mg/dL (ref 8.4–10.5)
Chloride: 101 mEq/L (ref 96–112)
Creatinine, Ser: 1 mg/dL (ref 0.50–1.10)
GFR calc Af Amer: 67 mL/min — ABNORMAL LOW (ref >90)
GFR calc non Af Amer: 57 mL/min — ABNORMAL LOW (ref >90)
Glucose, Bld: 106 mg/dL — ABNORMAL HIGH (ref 70–99)
Potassium: 4.6 mEq/L (ref 3.5–5.1)
Sodium: 138 mEq/L (ref 135–145)
Total Bilirubin: 0.1 mg/dL — ABNORMAL LOW (ref 0.3–1.2)
Total Protein: 6.9 g/dL (ref 6.0–8.3)

## 2011-01-07 LAB — DIFFERENTIAL
Basophils Absolute: 0 10*3/uL (ref 0.0–0.1)
Basophils Relative: 1 % (ref 0–1)
Eosinophils Absolute: 0.2 10*3/uL (ref 0.0–0.7)
Eosinophils Relative: 6 % — ABNORMAL HIGH (ref 0–5)
Lymphocytes Relative: 42 % (ref 12–46)
Lymphs Abs: 1.5 10*3/uL (ref 0.7–4.0)
Monocytes Absolute: 0.3 10*3/uL (ref 0.1–1.0)
Monocytes Relative: 7 % (ref 3–12)
Neutro Abs: 1.6 10*3/uL — ABNORMAL LOW (ref 1.7–7.7)
Neutrophils Relative %: 45 % (ref 43–77)

## 2011-01-08 LAB — GLUCOSE, CAPILLARY
Glucose-Capillary: 83 mg/dL (ref 70–99)
Glucose-Capillary: 121 mg/dL — ABNORMAL HIGH (ref 70–99)
Glucose-Capillary: 104 mg/dL — ABNORMAL HIGH (ref 70–99)
Glucose-Capillary: 157 mg/dL — ABNORMAL HIGH (ref 70–99)
Glucose-Capillary: 149 mg/dL — ABNORMAL HIGH (ref 70–99)

## 2011-01-08 LAB — URINALYSIS, ROUTINE W REFLEX MICROSCOPIC
Bilirubin Urine: NEGATIVE
Glucose, UA: NEGATIVE mg/dL
Hgb urine dipstick: NEGATIVE
Ketones, ur: NEGATIVE mg/dL
Leukocytes, UA: NEGATIVE
Nitrite: NEGATIVE
Protein, ur: NEGATIVE mg/dL
Specific Gravity, Urine: 1.013 (ref 1.005–1.030)
Urobilinogen, UA: 0.2 mg/dL (ref 0.0–1.0)
pH: 5.5 (ref 5.0–8.0)

## 2011-01-08 LAB — VITAMIN B1: Vitamin B1 (Thiamine): 30

## 2011-01-09 DIAGNOSIS — M48061 Spinal stenosis, lumbar region without neurogenic claudication: Secondary | ICD-10-CM

## 2011-01-09 DIAGNOSIS — R5381 Other malaise: Secondary | ICD-10-CM

## 2011-01-09 LAB — CBC
HCT: 34.4 % — ABNORMAL LOW (ref 36.0–46.0)
Hemoglobin: 11.5 g/dL — ABNORMAL LOW (ref 12.0–15.0)
MCH: 31.3 pg (ref 26.0–34.0)
MCHC: 33.4 g/dL (ref 30.0–36.0)
MCV: 93.7 fL (ref 78.0–100.0)
Platelets: 284 K/uL (ref 150–400)
RBC: 3.67 MIL/uL — ABNORMAL LOW (ref 3.87–5.11)
RDW: 13.8 % (ref 11.5–15.5)
WBC: 6.8 K/uL (ref 4.0–10.5)

## 2011-01-09 LAB — COMPREHENSIVE METABOLIC PANEL
ALT: 207 U/L — ABNORMAL HIGH (ref 0–35)
AST: 48 U/L — ABNORMAL HIGH (ref 0–37)
Albumin: 3.7 g/dL (ref 3.5–5.2)
Alkaline Phosphatase: 136 U/L — ABNORMAL HIGH (ref 39–117)
BUN: 35 mg/dL — ABNORMAL HIGH (ref 6–23)
CO2: 27 mEq/L (ref 19–32)
Calcium: 10.1 mg/dL (ref 8.4–10.5)
Chloride: 97 mEq/L (ref 96–112)
Creatinine, Ser: 0.95 mg/dL (ref 0.50–1.10)
GFR calc Af Amer: 71 mL/min — ABNORMAL LOW (ref >90)
GFR calc non Af Amer: 61 mL/min — ABNORMAL LOW (ref >90)
Glucose, Bld: 202 mg/dL — ABNORMAL HIGH (ref 70–99)
Potassium: 4.3 mEq/L (ref 3.5–5.1)
Sodium: 137 mEq/L (ref 135–145)
Total Bilirubin: 0.2 mg/dL — ABNORMAL LOW (ref 0.3–1.2)
Total Protein: 8.3 g/dL (ref 6.0–8.3)

## 2011-01-09 LAB — GLUCOSE, CAPILLARY
Glucose-Capillary: 106 mg/dL — ABNORMAL HIGH (ref 70–99)
Glucose-Capillary: 88 mg/dL (ref 70–99)
Glucose-Capillary: 175 mg/dL — ABNORMAL HIGH (ref 70–99)
Glucose-Capillary: 135 mg/dL — ABNORMAL HIGH (ref 70–99)

## 2011-01-09 LAB — URINE CULTURE
Colony Count: NO GROWTH
Culture  Setup Time: 201210161419
Culture: NO GROWTH

## 2011-01-09 NOTE — Discharge Summary (Addendum)
NAMEMarland Kitchen  LEVONIA, WOLFLEY NO.:  [id number]  MEDICAL RECORD NO.:  [id number]  LOCATION:  4029                         FACILITY:  [facility]  PHYSICIAN:  Deirdre Peer. Cledis Sohn, M.D. DATE OF BIRTH:  [birth year]  DATE OF ADMISSION:  01/04/2011 DATE OF DISCHARGE:                              DISCHARGE SUMMARY   DISCHARGE DIAGNOSES: 1. Left-sided weakness, evaluation negative for cerebrovascular     accident.  The patient was found to have a rotator cuff tear on the     left.  PT recommended.  She was seen by Orthopedics.  She also had     left leg weakness.  MRI of her spine without significant disease.     Ultimately, it was felt that most of her weakness was secondary to     a fall which the patient neglected to make the treating MDs away     until 3-4 days into her hospitalization.  She is at high fall risk.     Has several other comorbidities including cerebral palsy with right     hemiplegia, therefore rehab was recommended.  The patient did not     want outpatient rehab and she was felt to be a good candidate for     inpatient rehab. 2. Diabetes, well controlled.  The patient had a spell of     hypoglycemia.  During this hospitalization, her glyburide was held.     She will continue on her metformin. 3. Hypertension.  The patient did have some alterations to her med     because of relative hypotension. 4. Seizure disorder, stable. 5. History of gout. 6. Dyslipidemia. 7. Breast cancer status post left mastectomy. 8. Cerebral palsy with right hemiplegia.  DISCHARGE MEDICATIONS: 1. The patient will continue her colchicine 0.6 mg daily. 2. Carbamazepine 200 mg t.i.d. 3. Gabapentin 300 mg t.i.d. 4. Keppra 500 mg b.i.d. 5. Allopurinol 100 mg daily. 6. Metformin 1 g b.i.d. 7. Flexeril 10 mg t.i.d. p.r.n. 8. Simvastatin 20 mg daily. 9. Aspirin 325 mg daily. 10.Meloxicam 15 mg daily. 11.Travatan daily. 12.Loratadine 10 mg daily. 13.Lopressor 50 mg b.i.d.  DISPOSITION:   The patient was discharged to inpatient rehab.  CONSULTANTS: 1. Inpatient Rehab. 2. Orthopedics. 3. Neurology.  STUDIES:  The patient had an MRI of the left shoulder which revealed small full-thickness tear of the anterior aspect of the distal supraspinatus tendon without retraction.  Also, showed a longitudinal split tear of the proximal long head of the biceps tendon.  MRI of the L- spine showed degenerative anterior listhesis and uncovering of the disk at L4-5 and L5-S1.  Right greater than left lateral recess narrowing at L4-5.  Mild lateral recess narrowing bilaterally at L5-S1.  Prominent epidural fat at L4-5 and L5-S1 compatible with epidural lipomatosis. Mild facet hypertrophy at L3-4 without significant stenosis.  Shoulder x- ray is negative for fracture.  CT of the spine, no visible fascia or neck inflammation or mass.  Moderate cervical spondylosis, worse at C5-6 and C6-7.  MRI of the C-spine, multilevel cervical spondylosis.  Chronic T3 and T4 compression fracture.  MRI of the brain, no acute intracranialabnormality, chronic left MCA posterior division infarct.  MRA, mild intracranial atherosclerosis  and dolichoectasia, otherwise negative intracranial MRA.  Chest x-ray, shallow inspiration, no evidence of pulmonary disease.  HISTORY OF PRESENT ILLNESS:  Pleasant 65 year old female presented to the hospital with left-sided weakness.  In the ED, the patient was evaluated, admission was deemed necessary for further evaluation and treatment to rule out CVA.  Past medical history, medications, social history, past surgical history, allergies per admission history and physical.  HOSPITAL COURSE: 1. The patient was admitted to the telemetry floor bed for evaluation     and treatment of left-sided weakness.  The first 3 days of the     patient's hospitalization was spent on evaluation of her left-sided     weakness.  Ultimately, MRI and MRA was negative for stroke.  The      patient ultimately admitted to falling and had several other     studies as dictated above.  Additional information was gotten from     her family and it appears that the patient does have some issues     with memory and occasionally when she fell, she does not want to     tell anyone.  Imaging of her shoulder revealed rotator cuff tear as     discussed above.  Imaging of the L-spine did not show any pathology     to account for her weakness.  Ultimately, it was determined that     her weakness was secondary to a fall and probable rotator cuff     damage.  She was seen in consultation by Orthopedics, Neurology,     and Rehab.  Ultimately, it was determined that inpatient rehab was     needed for this patient. 2. Cerebral palsy with right hemiplegia. 3. Diabetes.  Medications as discussed above.  She did transiently     have a hypoglycemic spell.  It was recommended to hold her     Glucotrol. 4. Hypertension.  She has some relative hypotensive spells during this     hospitalization and her meds were adjusted.  The patient has     several other chronic medical problems.  They remained stable     during this hospitalization.  She will continue her meds as     discussed above.  Please note greater than half an hour was spent on discharge process of this patient.  I discussed the results with her and her family.     Deirdre Peer. Breiana Stratmann, M.D.     RDP/MEDQ  D:  01/04/2011  T:  01/04/2011  Job:  [id number]  Electronically Signed by Windy Fast Zissy Hamlett M.D. on 01/30/2011 05:06:58 PM

## 2011-01-10 LAB — GLUCOSE, CAPILLARY
Glucose-Capillary: 99 mg/dL (ref 70–99)
Glucose-Capillary: 136 mg/dL — ABNORMAL HIGH (ref 70–99)
Glucose-Capillary: 138 mg/dL — ABNORMAL HIGH (ref 70–99)
Glucose-Capillary: 158 mg/dL — ABNORMAL HIGH (ref 70–99)

## 2011-01-11 LAB — GLUCOSE, CAPILLARY
Glucose-Capillary: 106 mg/dL — ABNORMAL HIGH (ref 70–99)
Glucose-Capillary: 117 mg/dL — ABNORMAL HIGH (ref 70–99)
Glucose-Capillary: 128 mg/dL — ABNORMAL HIGH (ref 70–99)
Glucose-Capillary: 148 mg/dL — ABNORMAL HIGH (ref 70–99)
Glucose-Capillary: 183 mg/dL — ABNORMAL HIGH (ref 70–99)

## 2011-01-12 LAB — GLUCOSE, CAPILLARY
Glucose-Capillary: 127 mg/dL — ABNORMAL HIGH (ref 70–99)
Glucose-Capillary: 119 mg/dL — ABNORMAL HIGH (ref 70–99)
Glucose-Capillary: 119 mg/dL — ABNORMAL HIGH (ref 70–99)
Glucose-Capillary: 124 mg/dL — ABNORMAL HIGH (ref 70–99)

## 2011-01-13 LAB — GLUCOSE, CAPILLARY
Glucose-Capillary: 96 mg/dL (ref 70–99)
Glucose-Capillary: 125 mg/dL — ABNORMAL HIGH (ref 70–99)
Glucose-Capillary: 131 mg/dL — ABNORMAL HIGH (ref 70–99)
Glucose-Capillary: 144 mg/dL — ABNORMAL HIGH (ref 70–99)

## 2011-01-13 LAB — URINALYSIS, ROUTINE W REFLEX MICROSCOPIC
Bilirubin Urine: NEGATIVE
Glucose, UA: NEGATIVE mg/dL
Ketones, ur: NEGATIVE mg/dL
Nitrite: POSITIVE — AB
Protein, ur: NEGATIVE mg/dL
Specific Gravity, Urine: 1.014 (ref 1.005–1.030)
Urobilinogen, UA: 0.2 mg/dL (ref 0.0–1.0)
pH: 6.5 (ref 5.0–8.0)

## 2011-01-13 LAB — URINE MICROSCOPIC-ADD ON

## 2011-01-14 LAB — GLUCOSE, CAPILLARY
Glucose-Capillary: 118 mg/dL — ABNORMAL HIGH (ref 70–99)
Glucose-Capillary: 129 mg/dL — ABNORMAL HIGH (ref 70–99)
Glucose-Capillary: 114 mg/dL — ABNORMAL HIGH (ref 70–99)
Glucose-Capillary: 142 mg/dL — ABNORMAL HIGH (ref 70–99)

## 2011-01-15 LAB — GLUCOSE, CAPILLARY: Glucose-Capillary: 110 mg/dL — ABNORMAL HIGH (ref 70–99)

## 2011-01-16 LAB — URINE CULTURE
Colony Count: >=100000
Culture  Setup Time: 201210212121

## 2011-01-22 NOTE — H&P (Signed)
NAME:  Donna Becker, LEDER NO.:  [id number]  MEDICAL RECORD NO.:  [id number]  LOCATION:  4000                         FACILITY:  [facility]  PHYSICIAN:  Erick Colace, M.D.DATE OF BIRTH:  07-25-1945  DATE OF ADMISSION:  01/04/2011 DATE OF DISCHARGE:                             HISTORY & PHYSICAL   REASON FOR ADMISSION:  Deconditioning.  HISTORY:  A 65 year old female with a chronic right hemiparesis due to cerebral palsy as well as seizure disorder, who was admitted on December 27, 2010 with a 4-day history of left-sided neck pain, left-sided headache, mild left-sided weakness.  MRI/MRA of the brain showed a chronic left MCA infarct with white matter changes.  No intracranial stenosis.  Neurology was consulted.  MRI of the cervical spine showed multilevel spondylosis with mild to moderate central stenosis, C4 through C7.  MRI of the left shoulder showed a small full-thickness tear of the distal supraspinatus and a longitudinal tear of the proximal long head of the biceps tendon.  Right upper extremity Doppler was negative for DVT.  Neuro recommended EMG NCV at discharge.  The patient had left lower extremity pain.  Dopplers were negative for DVT.  She underwent physical therapy evaluations and because of poor functional status and slow progress with acute care, PT/OT and PMNR was consulted on January 01, 2011.  MRI of the lumbar spine showing epidural lipomatosus at L4-5, L5-S1, right greater left lateral recess narrowing at L4-5.  Degenerative anterolisthesis L4-5 and L5-S1.  PAST MEDICAL HISTORY: 1. Type 2 diabetes. 2. Hypertension. 3. Seizure disorder. 4. History of cellulitis. 5. Gout. 6. Hyperlipidemia. 7. Lymphedema. 8. Breast cancer, status post left mastectomy. 9. Bilateral cataracts. 10.Diabetic neuropathy.  HOME MEDICATIONS:  Include: 1. Colchicine 0.6 daily. 2. Flexeril 10 mg t.i.d. 3. Lovaza 1 p.o. daily. 4. Celebrex 1 p.o. daily. 5.  Neurontin 300 mg t.i.d. 6. Mobic 15 mg daily. 7. Glucotrol 10 mg p.o. daily. 8. Tegretol 200 mg t.i.d. 9. Metformin 1000 mg p.o. daily. 10.Keppra 500 mg b.i.d. 11.Zocor 20 mg p.o. daily. 12.Lopressor 50 mg p.o. b.i.d. 13.Aspirin 325 mg per day.  REVIEW OF SYSTEMS:  Left shoulder pain overall improved.  Left calf pain overall improved.  No bowel or bladder dysfunction except for constipation.  No respiratory problems.  She has mild swallowing problems, which she thinks may have occurred since her stroke.  SOCIAL HISTORY:  Lives in 1-level home, ramp entry.  Lives with her elderly mother, who she actually cares for.  Quit tobacco 20 years ago. No ETOH.  ALLERGIES:  CODEINE.  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  PHYSICAL EXAMINATION:  GENERAL:  No acute distress.  Mood and affect are appropriate.  Body habitus is obese. HEENT:  Eyes:  Anicteric, noninjected.  Extraocular muscles intact. External ENT normal. NECK:  Supple without adenopathy. RESPIRATORY:  Effort is good.  Lungs are clear. HEART:  Regular rate and rhythm.  No rubs, murmurs, or extra sounds. ABDOMEN:  Obese.  Decreased bowel sounds. SKIN:  No evidence of breakdown.  She does have  left upper extremity lymphedema.  No open lesions. NEUROLOGIC:  Her motor strength is 2+ at the right deltoid, 3 at the biceps, 3 at the triceps, 4 at the finger flexors, but this is also with contracture, cannot fully extend her fingers.  Left deltoid is 3, biceps and triceps are 3, finger flexors are 4.  In the lower extremities, she has 2 in the hip flexor, 2 in the quad, 2 in the TA and gastroc on the left side, 3 in the hip flexor, 3 at the quad, 2 at the TA and gastroc. Sensation is diminished bilateral feet and up to the right knee.   Left knee has normal sensation to light touch. PSYCHIATRIC:  Mood, memory, affect, and orientation are intact.  POST ADMISSION PHYSICIAN EVALUATION: 1. Functional deficits secondary to deconditioning superimposed on     chronic right hemiparesis from cerebral palsy.  She also has a left     rotator cuff tear bicipital tendonitis.  Her left lower extremity     weakness may be related to lumbar stenosis.  In addition, she does     have diabetic neuropathy affecting bilateral feet. 2. The patient admitted to receive collaborative interdisciplinary     care between the physiatrist, rehab nursing staff, and therapy     team. 3. The patient's level of medical complexity and substantial therapy     needs in context of the medical necessity cannot be provided at a     lesser intensity of care. 4. The patient has experienced a functional loss from her baseline.     Upon functional assessment at the time of preadmission screening,     the patient was at +2 total 60% for transfers and 60% gait 12-18     feet, dressing mod assist upper body and total assist lower body.     Judging by the patient's diagnosis, physical exam, and functional     history, the patient has the potential for functional progress,     which will result in measurable gains while in inpatient rehab.     These gains will be of substantial and practical use upon discharge     to home in facilitating mobility, self-care, and independence.     Interim change in medical status since preadmission screening are     detailed in the history of present illness. 5. Physiatrist will provide 24-hour management in medical needs as     well as oversight of therapy plan/treatment, and provide guidance     appropriate regarding interaction of the two. 6. [facility] rehab nursing will assist in the management of skin,     bowel, bladder, and help to integrate therapy concepts, techniques,     education. 7. The PT will assess and treat for  pre gait training, gait training,     endurance, safety.  Equipment goals are for a modified independent     level to supervision level with all mobility. 8. OT will assess and treat for ADLs, cognitive perceptual skills,     neuromuscular re-education, external rotator strengthening in     context of the left rotator cuff tear.  Goals are for modified     independent level with upper body and supervision lower body ADLs. 9. Case management and social worker will assess and treat for     psychosocial issues and discharge planning. 10.Team conference will be held weekly to assess the patient     progress/goals to determine barriers to discharge. 11.The patient has demonstrated  sufficient medical stability and     exercise capacity to tolerate at least 3 hours of therapy per day     at least 5 days per week. 12.Estimated length of stay is 2 weeks.  Prognosis for further     functional improvement is good.  MEDICAL PROBLEM LIST AND PLAN: 1. Deep venous thrombosis prophylaxis.  Lovenox 40 subcu daily. 2. Gout.  Continue colchicine 0.62 daily.  Avoid prednisone secondary     to history of epidural lipomatosis, which may worsen lumbar     stenosis. 3. Diabetic neuropathy.  Continue using Neurontin for pain management. 4. Seizure disorder.  Continue Tegretol and Keppra. 5. Diabetes.  Continue metformin and Glucotrol. 6. History of hypertension.  Continue Lopressor.  Motivation and mood appeared to be adequate for full participation in inpatient rehabilitation program.     Erick Colace, M.D.     AEK/MEDQ  D:  01/04/2011  T:  01/04/2011  Job:  [id number]  cc:   Deirdre Peer. Polite, M.D. Marlan Palau, M.D.  Electronically Signed by Claudette Laws M.D. on 01/22/2011 12:31:43 PM

## 2011-01-28 NOTE — Discharge Summary (Signed)
NAME:  Donna Becker, Donna Becker NO.:  [id number]  MEDICAL RECORD NO.:  [id number]  LOCATION:  4029                         FACILITY:  [facility]  PHYSICIAN:  Erick Colace, M.D.DATE OF BIRTH:  [birth year]  DATE OF ADMISSION:  01/04/2011 DATE OF DISCHARGE:  01/15/2011                              DISCHARGE SUMMARY   DISCHARGE DIAGNOSES: 1. Left rotator cuff tear and left knee contusion. 2. Cerebral palsy with right hemiparesis. 3. 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 4. Escherichia coli urinary tract infection. 5. Diabetes mellitus type 2. 6. Seizure disorder. 7. Gout. 8. Obesity. 9. Breast cancer with chronic lymphedema, left upper extremity.  HISTORY OF PRESENT ILLNESS:  Donna Becker is a 65 year old female with history of CP and right hemiparesis, seizure disorder, diabetes mellitus, admitted on December 27, 2010 with 4-day history of left neck pain, left headache, and mild left-sided weakness.  Donna Becker reported fall onto Donna left side few days prior to admission.  MRI/MRA of brain showed chronic left MCA infarct, moderate white matter changes.  No stenosis.  Neuro was consulted for input.  An MRI C-spine done showed multilevel spondylosis with mild-to-moderate canal stenosis, C4 to C7. CTA neck showed no adenopathy.  Right upper extremity Dopplers as well as left lower extremity Dopplers were negative for DVT.  Donna Becker continued to have issues with left shoulder pain with decreased range of motion.  MRI of left shoulder showed small full-thickness tear, distal supraspinatus, and longitudinal tear, proximal head of biceps tendon. Dr. Lequita Halt was consulted for input and recommended physical therapy with injection for pain if Donna Becker unable to tolerate therapy.  Donna Becker can follow up with Neurology for EMG nerve conduction study for full workup past discharge.  MRI of L-spine showed degenerative changes with anterolisthesis,  L4-L5 and L5-S1.  Therapies are ongoing and Becker is limited in mobility by pain in left shoulder.  She was started on prednisone taper 10 for pain management.  Donna Becker was evaluated by rehab, and we felt that she would benefit from a CIR program.  PAST MEDICAL HISTORY:  Significant for: 1. DM type 2. 2. Hypertension. 3. Seizure disorder. 4. Gout. 5. Dyslipidemia. 6. History of breast cancer with left mastectomy and chronic left     upper extremity lymphedema. 7. Bilateral cataracts. 8. Diabetic neuropathy. 9. CP with right hemiparesis. 10.Multiple surgeries to right lower extremity. 11.Fusion of right foot. 12.Glaucoma. 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 14.Obesity. 15.Excision mass on palate.  ALLERGIES:  CODEINE.  REVIEW OF SYSTEMS:  Positive left shoulder and left knee pain, weakness and numbness in left greater than right lower extremity as well as left calf pain and spasms for Donna past month.  FAMILY HISTORY:  Positive for OA and hypertension.  SOCIAL HISTORY:  Donna Becker lives with her mother who is 101 and bed bound as well as brother in 1-level home with a ramp at entry.  She has a caregiver for her mother.  Retired and disabled since 1994.  Does not use any tobacco or alcohol.  Brother works nights.  FUNCTIONAL HISTORY:  Donna Becker was independent prior to admission. She has a cane that she uses occasionally.  She has not driven in a year.  FUNCTIONAL STATUS:  Donna Becker is +2 total assist, 40% to stand with initial posterior lean.  Min assist to ambulate 12 feet x2 with a rolling walker.  She is mod-to-max assist for upper body care, total assist 50% for lower body care.  PHYSICAL EXAMINATION:  VITAL SIGNS:  118/76, pulse 71, respiratory rate 20, temperature 98.9. GENERAL:  Donna Becker is obese female, alert and oriented x3.  No acute distress. HEENT:  Eyes anicteric, noninjected.  Extraocular muscles intact.  Oral mucosa is pink and moist.   Fair dentition. NECK:  Supple without JVD or lymphadenopathy. LUNGS:  Clear to auscultation bilaterally with good respiratory effort. HEART:  Regular rate and rhythm without murmurs, gallops, or rubs. ABDOMEN:  Obese, positive bowel sounds noted. SKIN:  Without evidence of breakdown. EXTREMITIES:  Donna Becker with 2+ edema, left upper extremity.  Noted to have pain in left knee with flexion/extension as well as tightness of left hamstring.  Also noted to have pain with range of motion of left shoulder. NEUROLOGIC:  Donna Becker is alert and oriented x3.  Speech is mildly dysarthric.  Motor strength is 2+ right deltoid, 3 at biceps, 3 at triceps, 4 at finger flexures, but Donna Becker with contracture and cannot fully extend her fingers.  Left deltoid is 3/5.  Biceps, triceps are 3/5.  Finger flexures are 4/5.  In right lower extremity, she is 2/5 hip flexures, 2/5 quad, 2 in TA and gastroc on left side.  She is 3/5 hip flexures, 3/5 quad, 2/5 TA and gastroc.  Sensation is diminished in bilateral feet up to Donna right knee.  On left knee, normal sensation to light touch.  Memory, mood, affect and orientation are intact.  HOSPITAL COURSE:  Donna Becker was admitted to rehab on January 04, 2011 for inpatient therapies to consist of PT/OT at least 3 hours, 5 days a week.  Past admission, physiatrist, rehab, RN, and therapy team have worked together to provide customized collaborative interdisciplinary care.  Donna Becker was taken off prednisone and was started on Voltaren gel to left shoulder and left knee to help with her symptomatology.  She was also treated with Mobic x1 week.  Her blood pressures were monitored on b.i.d. basis and these have been reasonable ranging from 120s-130s systolic, 70-73 diastolic.  Check of lytes done past admission showed some renal insufficiency with BUN at 41 and creatinine 1.00.  She was also noted to have abnormal LFTs with APHOS at 127, SGOT 235,  SGPT 395, total protein 6.9.  She has had no GI symptoms. This was rechecked on January 09, 2011 and shows much improvement with APHOS at 136, SGOT 48, SGPT 207, total bili 0.2.  Donna Becker would need a recheck hepatic function test past discharge for followup of her abnormal LFTs.  Question medication related.  Her renal status is also much improved with lytes revealing sodium 137, potassium 4.3, chloride 97, CO2 of 27, BUN 35, creatinine 0.95, glucose 202.  UA/UC was done past admission and origin urine culture showed no growth.  Repeat UA was done on January 13, 2011 secondary to complaints of dysuria.  This showed greater than 100,000 colonies of E. coli.  Donna Becker was started on Cipro for treatment.  Sensitivities are currently pending. Donna Becker's pain control has improved overall.  She is currently using oxycodone on p.r.n. basis.  Donna Becker's diabetes has been monitored with [facility] and CBG checks.  Blood sugars  are well controlled ranging from 110 to occasionally high in 140s.  She was advised to continue on carb- modified diet for now and follow up with Dr. Nehemiah Settle regarding a further adjustment in her hypoglycemic protocol.  Donna Becker's spasticity of left lower extremity was treated with scheduled baclofen and Flexeril was discontinued.  Her Neurontin has also been titrated to 600 mg p.o. t.i.d. to help with her overall symptoms.  She is set up for EMG nerve conduction study with Dr. Wynn Banker past discharge.  During Donna Becker's stay in rehab, weekly team conferences were held to monitor Donna Becker's progress, set goals, as well as discuss barriers to discharge.  At admission, Donna Becker was noted to have acute left shoulder pain with chronic left upper extremity edema, generalized muscle weakness, as well as decrease in eccentric control with delayed balance reaction and decreased activity tolerance impacting her overall function.  Physical therapy has worked with Donna  Becker on overall mobility and strengthening.  Donna Becker has progressed to being at modified independent level for transfers, modified independent for ambulating 150 feet in a controlled environment with rolling walker. She is able to navigate 4 stairs with 1 rail at min assist.  She is able to perform car transfers with rolling walker at modified independent level.  Further followup home health PT to continue by [facility].  OT has worked with Donna Becker on self-care tasks.  At admission, Donna Becker with left upper extremity pain and swelling as well as a decreased strength overall affecting her activity tolerance and ability to carry out ADL tasks.  Donna Becker has made steady progress during her stay and is currently at modified independent with rolling walker to complete all Donna ADL and self-care tasks.  Education was done with Donna Becker's neighbor and friend regarding transfers, rolling walker use, as well as donning lymphedema sleeve.  Donna Becker to continue with further followup home health OT past discharge.  On January 15, 2011, Donna Becker is discharged to home.  DISCHARGE MEDICATIONS: 1. Baclofen 10 mg half p.o. b.i.d. 2. Tylenol 325-650 mg q.4 h. 3. Cipro 250 mg p.o. b.i.d. 4. Voltaren Gel to left shoulder q.i.d. 5. Metoprolol 25 mg p.o. b.i.d. 6. OxyIR 5 mg 1-2 p.o. q.i.d. p.r.n. moderate-to-severe pain, #30 Rx. 7. MiraLax 17 g in 8 ounces a day. 8. Coated aspirin 325 mg a day. 9. Metformin 500 mg 2 tablets p.o. q.a.m. 10.Neurontin 600 mg p.o. t.i.d. 11.Allopurinol 100 mg p.o. per day. 12.Colchicine 0.6 mg p.o. per day. 13.Fish oil 1 per day. 14.Keppra 500 mg p.o. b.i.d. 15.Multivitamin 1 per day. 16.Pravachol 40 mg p.o. at bedtime. 17.Tegretol 200 mg p.o. t.i.d. 18.Travatan 0.004% 1 gtt both eyes at bedtime. 19.Vitamin B complex 1 per day. 20.Zyrtec 10 mg 1 p.o. per day p.r.n.  DIET:  Carb modified, medium.  ACTIVITY LEVEL:  Intermittent  supervision.  No strenuous activity.  No driving.  Walk with a walker.  SPECIAL INSTRUCTIONS:  [facility] to provide PT, OT, and RN.  Do not use home dose of metoprolol 100 mg, glipizide, lisinopril, meloxicam, cyclobenzaprine.  FOLLOWUP:  Donna Becker to follow up with Dr. Wynn Banker on February 05, 2011, 2:45 for 3:15 appointment.  Follow up with Dr. Nehemiah Settle in 2 weeks. Follow up with Dr. Lequita Halt in 2 weeks.     Delle Reining, P.A.   ______________________________ Erick Colace, M.D.    PL/MEDQ  D:  01/15/2011  T:  01/16/2011  Job:  [id number]  cc:   Deirdre Peer. Polite, M.D. Ollen Gross, M.D.  Electronically Signed by Osvaldo Shipper. on 01/25/2011 03:00:03 PM Electronically Signed by Claudette Laws M.D. on 01/28/2011 11:26:24 AM

## 2011-02-05 ENCOUNTER — Encounter: Payer: Medicare Other | Attending: Physical Medicine & Rehabilitation

## 2011-02-05 ENCOUNTER — Inpatient Hospital Stay: Payer: Medicare Other | Admitting: Physical Medicine & Rehabilitation

## 2011-02-05 DIAGNOSIS — G40909 Epilepsy, unspecified, not intractable, without status epilepticus: Secondary | ICD-10-CM | POA: Insufficient documentation

## 2011-02-05 DIAGNOSIS — A498 Other bacterial infections of unspecified site: Secondary | ICD-10-CM | POA: Insufficient documentation

## 2011-02-05 DIAGNOSIS — IMO0002 Reserved for concepts with insufficient information to code with codable children: Secondary | ICD-10-CM | POA: Insufficient documentation

## 2011-02-05 DIAGNOSIS — M48061 Spinal stenosis, lumbar region without neurogenic claudication: Secondary | ICD-10-CM | POA: Insufficient documentation

## 2011-02-05 DIAGNOSIS — S43429A Sprain of unspecified rotator cuff capsule, initial encounter: Secondary | ICD-10-CM | POA: Insufficient documentation

## 2011-02-05 DIAGNOSIS — M542 Cervicalgia: Secondary | ICD-10-CM | POA: Insufficient documentation

## 2011-02-05 DIAGNOSIS — X58XXXA Exposure to other specified factors, initial encounter: Secondary | ICD-10-CM | POA: Insufficient documentation

## 2011-02-05 DIAGNOSIS — R209 Unspecified disturbances of skin sensation: Secondary | ICD-10-CM

## 2011-02-05 DIAGNOSIS — I890 Lymphedema, not elsewhere classified: Secondary | ICD-10-CM | POA: Insufficient documentation

## 2011-02-05 DIAGNOSIS — I69959 Hemiplegia and hemiparesis following unspecified cerebrovascular disease affecting unspecified side: Secondary | ICD-10-CM | POA: Insufficient documentation

## 2011-02-05 DIAGNOSIS — C50919 Malignant neoplasm of unspecified site of unspecified female breast: Secondary | ICD-10-CM | POA: Insufficient documentation

## 2011-02-05 DIAGNOSIS — R51 Headache: Secondary | ICD-10-CM | POA: Insufficient documentation

## 2011-02-05 DIAGNOSIS — E119 Type 2 diabetes mellitus without complications: Secondary | ICD-10-CM | POA: Insufficient documentation

## 2011-02-05 DIAGNOSIS — E669 Obesity, unspecified: Secondary | ICD-10-CM | POA: Insufficient documentation

## 2011-02-05 DIAGNOSIS — S8000XA Contusion of unspecified knee, initial encounter: Secondary | ICD-10-CM | POA: Insufficient documentation

## 2011-02-05 DIAGNOSIS — G808 Other cerebral palsy: Secondary | ICD-10-CM | POA: Insufficient documentation

## 2011-02-05 DIAGNOSIS — M47812 Spondylosis without myelopathy or radiculopathy, cervical region: Secondary | ICD-10-CM | POA: Insufficient documentation

## 2011-02-05 DIAGNOSIS — N390 Urinary tract infection, site not specified: Secondary | ICD-10-CM | POA: Insufficient documentation

## 2011-02-05 DIAGNOSIS — M109 Gout, unspecified: Secondary | ICD-10-CM | POA: Insufficient documentation

## 2011-03-05 ENCOUNTER — Ambulatory Visit: Payer: Medicare Other | Admitting: Physical Medicine & Rehabilitation

## 2011-03-05 ENCOUNTER — Encounter: Payer: Medicare Other | Attending: Physical Medicine & Rehabilitation

## 2011-03-05 DIAGNOSIS — S8000XA Contusion of unspecified knee, initial encounter: Secondary | ICD-10-CM | POA: Insufficient documentation

## 2011-03-05 DIAGNOSIS — E669 Obesity, unspecified: Secondary | ICD-10-CM | POA: Insufficient documentation

## 2011-03-05 DIAGNOSIS — M47812 Spondylosis without myelopathy or radiculopathy, cervical region: Secondary | ICD-10-CM | POA: Insufficient documentation

## 2011-03-05 DIAGNOSIS — S43429A Sprain of unspecified rotator cuff capsule, initial encounter: Secondary | ICD-10-CM | POA: Insufficient documentation

## 2011-03-05 DIAGNOSIS — M542 Cervicalgia: Secondary | ICD-10-CM | POA: Insufficient documentation

## 2011-03-05 DIAGNOSIS — I69959 Hemiplegia and hemiparesis following unspecified cerebrovascular disease affecting unspecified side: Secondary | ICD-10-CM | POA: Insufficient documentation

## 2011-03-05 DIAGNOSIS — R51 Headache: Secondary | ICD-10-CM | POA: Insufficient documentation

## 2011-03-05 DIAGNOSIS — N390 Urinary tract infection, site not specified: Secondary | ICD-10-CM | POA: Insufficient documentation

## 2011-03-05 DIAGNOSIS — G8110 Spastic hemiplegia affecting unspecified side: Secondary | ICD-10-CM

## 2011-03-05 DIAGNOSIS — C50919 Malignant neoplasm of unspecified site of unspecified female breast: Secondary | ICD-10-CM | POA: Insufficient documentation

## 2011-03-05 DIAGNOSIS — M109 Gout, unspecified: Secondary | ICD-10-CM | POA: Insufficient documentation

## 2011-03-05 DIAGNOSIS — IMO0002 Reserved for concepts with insufficient information to code with codable children: Secondary | ICD-10-CM | POA: Insufficient documentation

## 2011-03-05 DIAGNOSIS — M719 Bursopathy, unspecified: Secondary | ICD-10-CM

## 2011-03-05 DIAGNOSIS — X58XXXA Exposure to other specified factors, initial encounter: Secondary | ICD-10-CM | POA: Insufficient documentation

## 2011-03-05 DIAGNOSIS — M67919 Unspecified disorder of synovium and tendon, unspecified shoulder: Secondary | ICD-10-CM

## 2011-03-05 DIAGNOSIS — I890 Lymphedema, not elsewhere classified: Secondary | ICD-10-CM | POA: Insufficient documentation

## 2011-03-05 DIAGNOSIS — A498 Other bacterial infections of unspecified site: Secondary | ICD-10-CM | POA: Insufficient documentation

## 2011-03-05 DIAGNOSIS — G808 Other cerebral palsy: Secondary | ICD-10-CM | POA: Insufficient documentation

## 2011-03-05 DIAGNOSIS — E119 Type 2 diabetes mellitus without complications: Secondary | ICD-10-CM | POA: Insufficient documentation

## 2011-03-05 DIAGNOSIS — G40909 Epilepsy, unspecified, not intractable, without status epilepticus: Secondary | ICD-10-CM | POA: Insufficient documentation

## 2011-03-05 DIAGNOSIS — M48061 Spinal stenosis, lumbar region without neurogenic claudication: Secondary | ICD-10-CM | POA: Insufficient documentation

## 2011-03-05 NOTE — Assessment & Plan Note (Signed)
REASON FOR VISIT:  Problems walking, left shoulder pain.  Hospital discharge from inpatient rehab hospitalization.  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  Neurology was consulted.  MRI of the cervical spine showed multilevel stenosis, mild-to-moderate central stenosis. MRI of the shoulder showed a small full-thickness tear of the distal supraspinatus tendon and the longitudinal tear of the proximal long head of the biceps tendon.  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  Degenerative anterolisthesis L4-5, L5-S1. She continues to have some pain in the left lower extremity.  Because of the EMG and CV, really did not explain the predominance of left-sided symptoms in her lower extremity.  EMG and CV was performed.  This showed bilateral sensory motor conduction abnormalities, felt to be more related to her diabetes and in fact had no electrodiagnostic evidence of a lumbar radiculopathy.  She went through inpatient rehabilitation, admitted March 06, 2011, and discharged at March 17, 2011.  She was discharged to her home. She is followed up with Dr. Trula Slade, who is her primary care physician.  She saw Dr. Lequita Halt for ortho followup of the shoulder.  I explained to the patient that given she has lymphedema due to breast carcinoma on the left upper extremity, she is not a good surgical candidate for her rotator cuff repair.  She is now using a cane rather than a walker to ambulate.  She has finished up with OT, PT.  Medications include acetaminophen p.r.n. pain, baclofen 10 mg 1.5  tablet b.i.d.  She is on diclofenac gel for the left shoulder, metoprolol 25 b.i.d., metformin 500 mg b.i.d., Neurontin 600 mg t.i.d., allopurinol 100 mg daily, colchicine 0.6 daily, Keppra 500 b.i.d., Pravachol 40 daily, Tegretol 200 t.i.d.  On examination; obese female, in no acute distress.  Her vitals blood pressure 155/75, pulse 68, respirations 18 and O2 sat 94% on room air. General, no acute distress.  Her right upper extremity  strength is 3- at the deltoid biceps, triceps and grip.  Right lower extremity 3- in hip flexion, knee extension and ankle dorsiflexion.  Left upper extremity 3- at the deltoid, but 4 at the biceps, triceps and grip.  She has moderately severe lymphedema on left upper extremity from the shoulder down to the wrist or down to the hand.  In the left lower extremity, she has normal strength in hip flexors, knee extensors and ankle dorsiflexor.  Her back has no tenderness to palpation.  Her gait shows valgus deformity bilateral knees.  She favors the right lower extremity, bears weight more on the left side.  No evidence of toe drag or knee instability on the left side.  On the right side, she does not quite clear the foot, does some hip hiking to do so.  IMPRESSION: 1. 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 2. Left rotator cuff tear, subacute 3. Left lower extremity weakness.  I think this is more of a diabetic     neuropathy issue.  I have encouraged her to keep up with her     diabetic diet as well as her medications.  I  do not think she needs     a repeat EMG nor do I     think she needs a repeat MRI of the lumbar spine.  I will see her     back on a p.r.n. basis.  She will follow up medically with Dr. Trula Slade.     Erick Colace, M.D. Electronically Signed    AEK/MedQ D:  03/05/2011 16:11:42  T:  03/05/2011 23:26:35  Job #:  [id number]  cc:   Deirdre Peer. Polite, M.D. Fax: [phone]

## 2012-10-11 IMAGING — US US EXTREM  UP VENOUS*L*
1 series · 14 of 24 positions shown · non-contrast
Comparison: None.

CLINICAL DATA: History of breast cancer and left upper extremity
lymphedema with worsening generalized edema.

LEFT UPPER EXTREMITY VENOUS DUPLEX ULTRASOUND
TECHNIQUE: Gray-scale sonography with graded compression, as well
as color Doppler and duplex ultrasound were performed to evaluate
the upper extremity deep venous system from the level of the
subclavian vein and including the jugular, axillary, basilic and
upper cephalic vein.  Spectral Doppler was utilized to evaluate
flow at rest and with distal augmentation maneuvers.

[Series 1: us extrem up venous*left* · 14 of 26 slices shown]
[im 1/26]
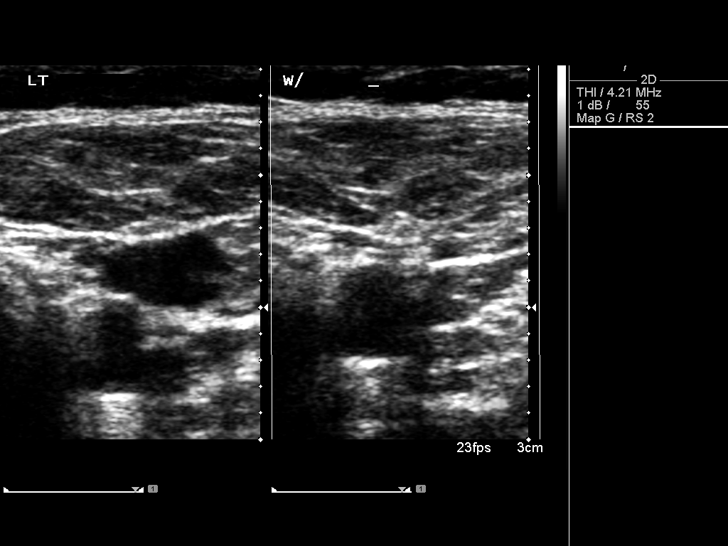
[im 3/26]
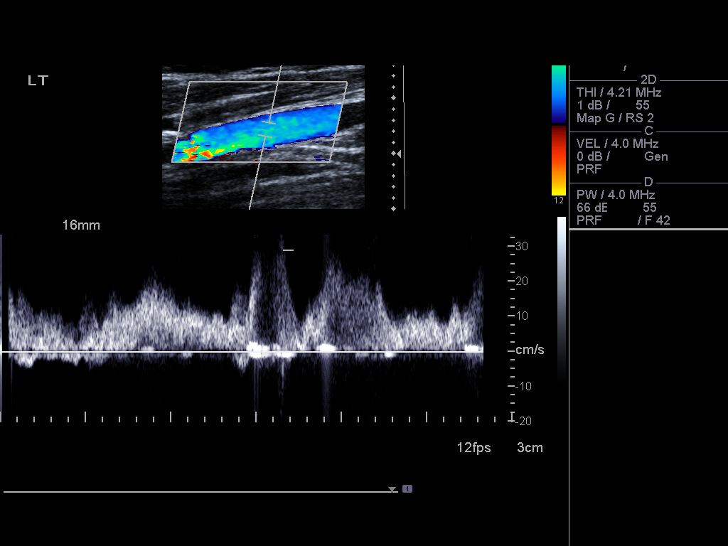
[im 5/26]
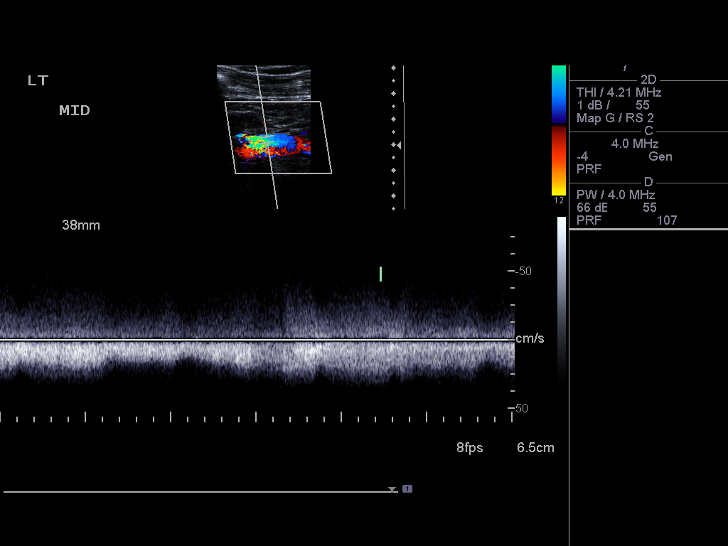
[im 7/26]
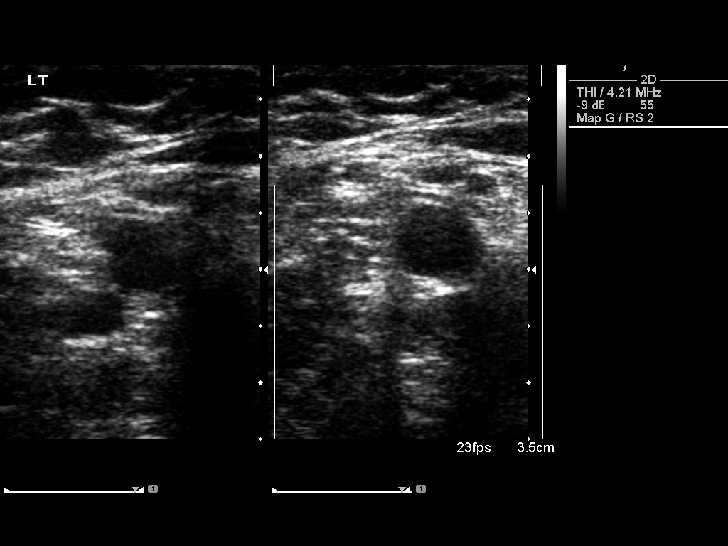
[im 8/26]
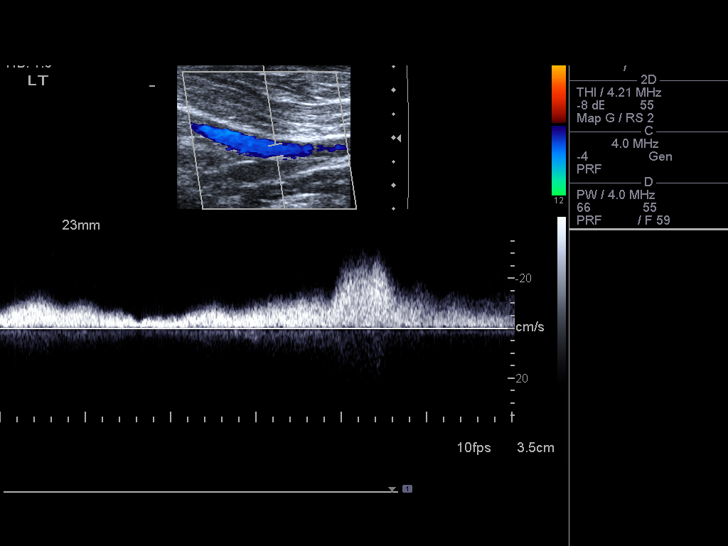
[im 10/26]
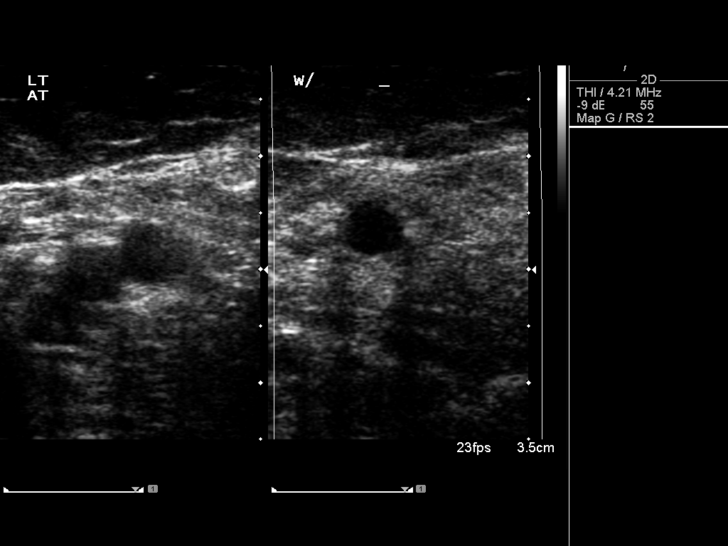
[im 12/26]
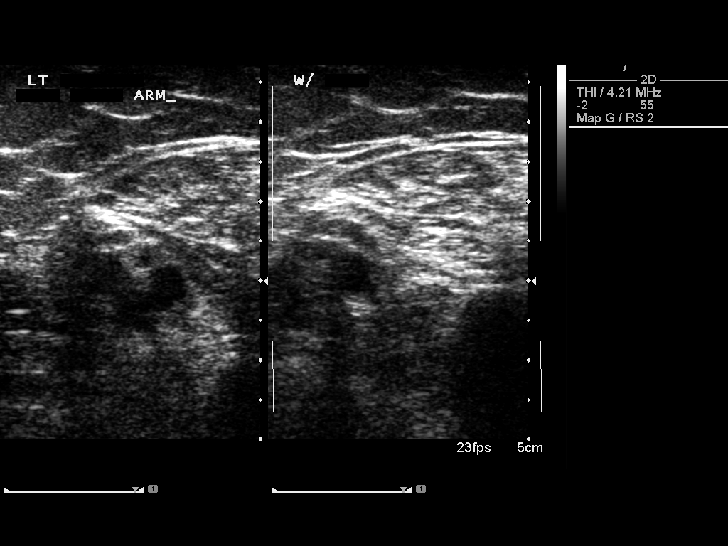
[im 14/26]
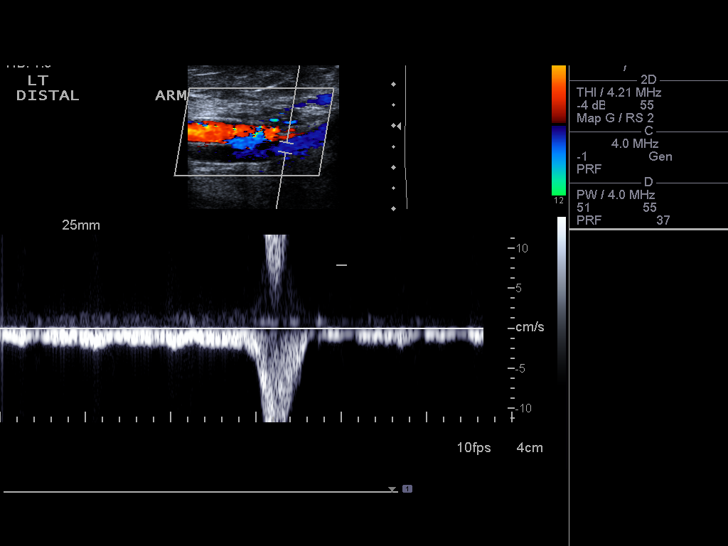
[im 16/26]
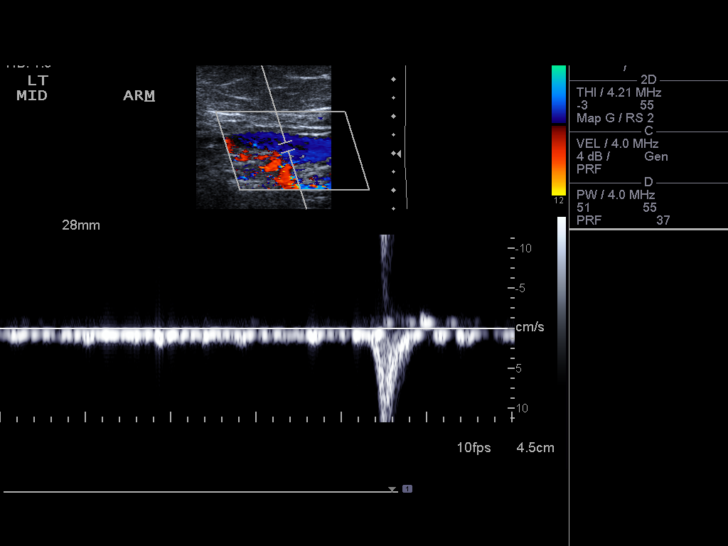
[im 18/26]
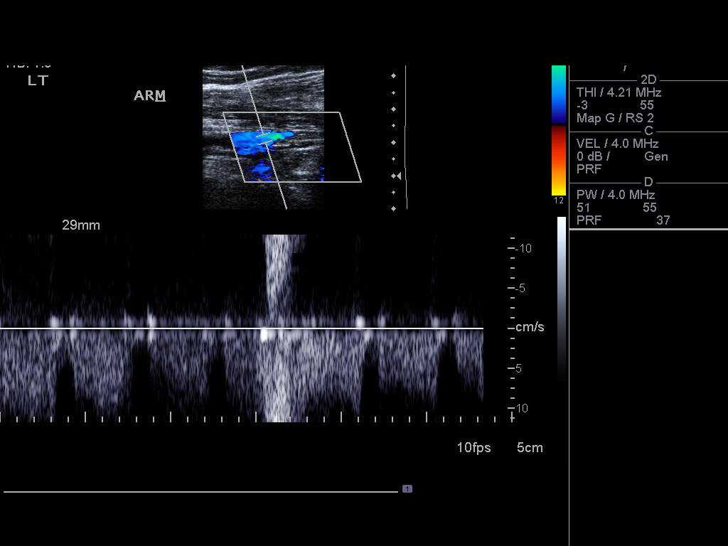
[im 20/26]
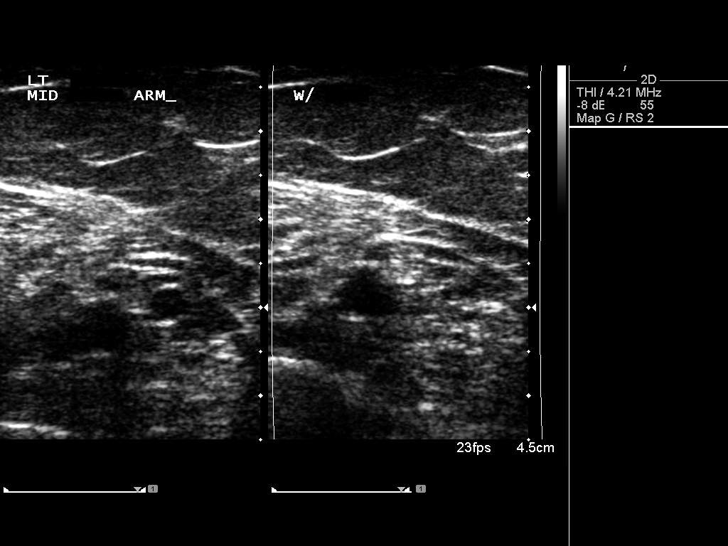
[im 21/26]
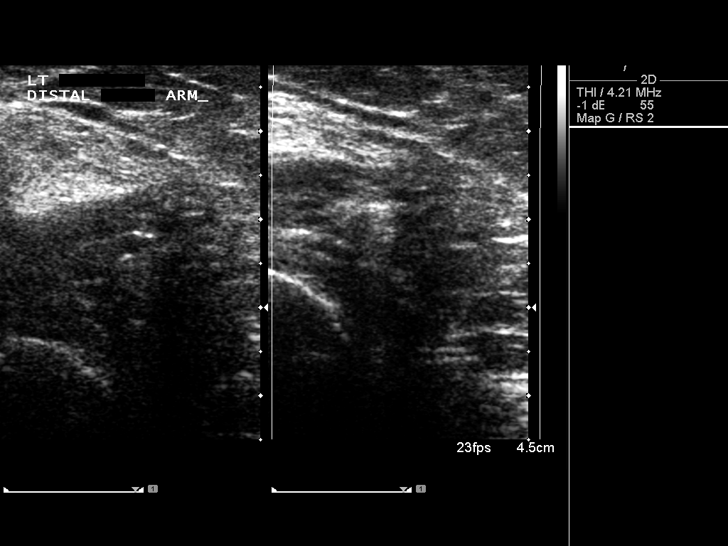
[im 23/26]
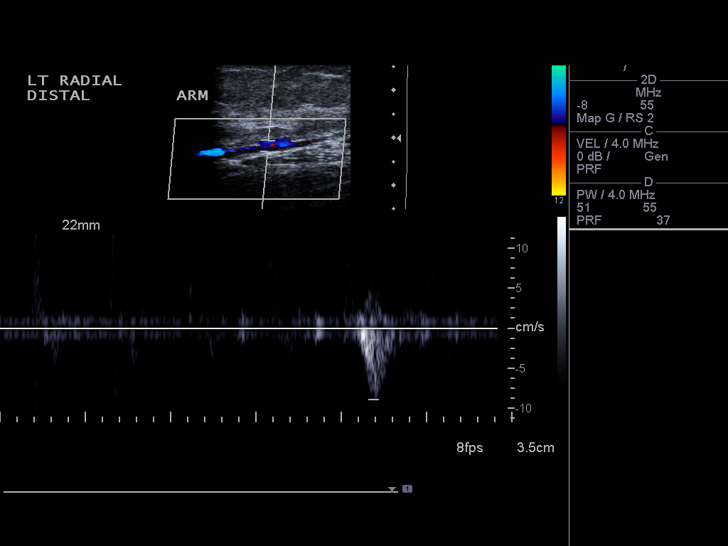
[im 26/26]
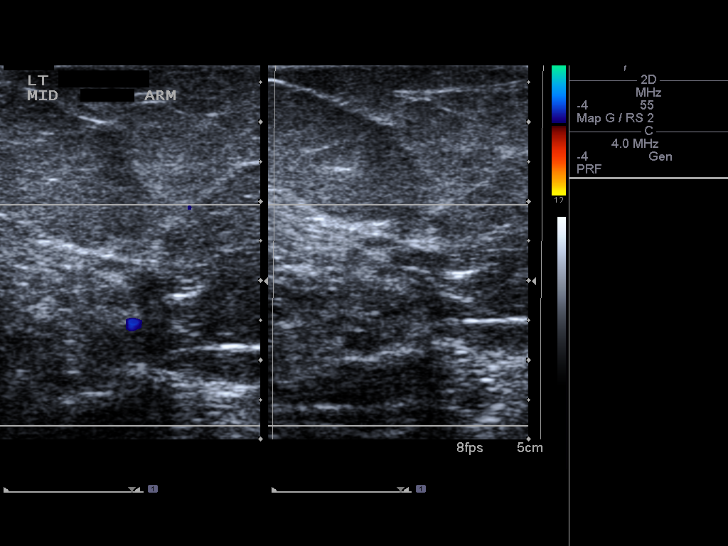

[14 of 24 positions shown; findings below may reference images not displayed]

FINDINGS: Normal compressibility of the upper extremity deep veins
is demonstrated.  No venous filling defects visualized on grayscale
or color Doppler US.  Normal direction of flow is seen throughout
the deep veins.  Spectral Doppler waveforms show normal morphology
at rest and with distal augmentation.
IMPRESSION: No evidence of upper extremity deep venous thrombosis.

## 2012-12-17 ENCOUNTER — Other Ambulatory Visit: Payer: Self-pay | Admitting: Internal Medicine

## 2012-12-17 DIAGNOSIS — I739 Peripheral vascular disease, unspecified: Secondary | ICD-10-CM

## 2012-12-23 ENCOUNTER — Other Ambulatory Visit (HOSPITAL_COMMUNITY): Payer: Self-pay | Admitting: Internal Medicine

## 2012-12-23 DIAGNOSIS — I739 Peripheral vascular disease, unspecified: Secondary | ICD-10-CM

## 2012-12-24 ENCOUNTER — Ambulatory Visit (HOSPITAL_COMMUNITY)
Admission: RE | Admit: 2012-12-24 | Discharge: 2012-12-24 | Disposition: A | Discharge status: Home / Self Care | Payer: Medicare PPO | Source: Ambulatory Visit | Attending: Vascular Surgery | Admitting: Vascular Surgery

## 2012-12-24 DIAGNOSIS — I739 Peripheral vascular disease, unspecified: Secondary | ICD-10-CM | POA: Insufficient documentation

## 2012-12-31 ENCOUNTER — Other Ambulatory Visit: Payer: Self-pay | Admitting: Gastroenterology

## 2012-12-31 ENCOUNTER — Other Ambulatory Visit (HOSPITAL_COMMUNITY): Payer: Self-pay | Admitting: Internal Medicine

## 2013-01-05 ENCOUNTER — Encounter (HOSPITAL_COMMUNITY): Payer: Self-pay | Admitting: Pharmacy Technician

## 2013-01-06 ENCOUNTER — Encounter (HOSPITAL_COMMUNITY): Payer: Self-pay | Admitting: *Deleted

## 2013-01-26 ENCOUNTER — Ambulatory Visit (HOSPITAL_COMMUNITY): Payer: Medicare PPO | Admitting: *Deleted

## 2013-01-26 ENCOUNTER — Encounter (HOSPITAL_COMMUNITY): Payer: Self-pay | Admitting: *Deleted

## 2013-01-26 ENCOUNTER — Encounter (HOSPITAL_COMMUNITY)
Admission: RE | Disposition: A | Discharge status: Home / Self Care | Payer: Self-pay | Source: Ambulatory Visit | Attending: Gastroenterology

## 2013-01-26 ENCOUNTER — Encounter (HOSPITAL_COMMUNITY): Payer: Medicare PPO | Admitting: *Deleted

## 2013-01-26 ENCOUNTER — Ambulatory Visit (HOSPITAL_COMMUNITY)
Admission: RE | Admit: 2013-01-26 | Discharge: 2013-01-26 | Disposition: A | Discharge status: Home / Self Care | Payer: Medicare PPO | Source: Ambulatory Visit | Attending: Gastroenterology | Admitting: Gastroenterology

## 2013-01-26 DIAGNOSIS — Z8673 Personal history of transient ischemic attack (TIA), and cerebral infarction without residual deficits: Secondary | ICD-10-CM | POA: Insufficient documentation

## 2013-01-26 DIAGNOSIS — E119 Type 2 diabetes mellitus without complications: Secondary | ICD-10-CM | POA: Insufficient documentation

## 2013-01-26 DIAGNOSIS — M109 Gout, unspecified: Secondary | ICD-10-CM | POA: Insufficient documentation

## 2013-01-26 DIAGNOSIS — G809 Cerebral palsy, unspecified: Secondary | ICD-10-CM | POA: Insufficient documentation

## 2013-01-26 DIAGNOSIS — Z9010 Acquired absence of unspecified breast and nipple: Secondary | ICD-10-CM | POA: Insufficient documentation

## 2013-01-26 DIAGNOSIS — I10 Essential (primary) hypertension: Secondary | ICD-10-CM | POA: Insufficient documentation

## 2013-01-26 DIAGNOSIS — E7800 Pure hypercholesterolemia, unspecified: Secondary | ICD-10-CM | POA: Insufficient documentation

## 2013-01-26 DIAGNOSIS — Z853 Personal history of malignant neoplasm of breast: Secondary | ICD-10-CM | POA: Insufficient documentation

## 2013-01-26 DIAGNOSIS — Z1211 Encounter for screening for malignant neoplasm of colon: Secondary | ICD-10-CM | POA: Insufficient documentation

## 2013-01-26 HISTORY — DX: Essential (primary) hypertension: I10

## 2013-01-26 HISTORY — DX: Personal history of other infectious and parasitic diseases: Z86.19

## 2013-01-26 HISTORY — DX: Malignant (primary) neoplasm, unspecified: C80.1

## 2013-01-26 HISTORY — PX: FLEXIBLE SIGMOIDOSCOPY: SHX5431

## 2013-01-26 HISTORY — DX: Other cerebral palsy: G80.8

## 2013-01-26 HISTORY — DX: Personal history of other medical treatment: Z92.89

## 2013-01-26 HISTORY — DX: Gout, unspecified: M10.9

## 2013-01-26 HISTORY — DX: Unspecified glaucoma: H40.9

## 2013-01-26 HISTORY — DX: Unspecified convulsions: R56.9

## 2013-01-26 HISTORY — DX: Type 2 diabetes mellitus without complications: E11.9

## 2013-01-26 HISTORY — DX: Myoneural disorder, unspecified: G70.9

## 2013-01-26 HISTORY — DX: Unspecified osteoarthritis, unspecified site: M19.90

## 2013-01-26 SURGERY — SIGMOIDOSCOPY, FLEXIBLE
Anesthesia: Monitor Anesthesia Care

## 2013-01-26 MED ORDER — LACTATED RINGERS IV SOLN: INTRAVENOUS | Status: DC

## 2013-01-26 MED ORDER — FENTANYL CITRATE 0.05 MG/ML IJ SOLN: 25.0000 ug | INTRAMUSCULAR | Status: DC | PRN

## 2013-01-26 SURGICAL SUPPLY — 21 items

## 2013-01-26 NOTE — Preoperative (Signed)
Beta Blockers   Reason not to administer Beta Blockers:Not Applicable, took BB this am 

## 2013-01-26 NOTE — H&P (Signed)
  Procedure: Screening colonoscopy  History: Patient is a 67 year old female born 02-Sep-1945. The patient underwent a colonoscopy performed at [facility]  years ago. I do not have a colonoscopy report.  The patient is scheduled to undergo a screening colonoscopy today.  Past medical history: Hypertension. Hypercholesterolemia. Cerebral palsy. Seizure disorder. Left mastectomy for breast cancer treatment. Type 2 diabetes mellitus. Cataracts. Stroke. Gout. Rotator cuff cuff tear on the left. Spinal stenosis. Cholecystectomy.  Medication allergies: Codeine causes rash.  Exam: The patient is alert and lying comfortably on the endoscopy stretcher. Lungs are clear to auscultation. Cardiac exam reveals a regular rhythm. Abdomen is soft and nontender to palpation.  Plan: Proceed with screening colonoscopy using propofol sedation.

## 2013-01-26 NOTE — Anesthesia Postprocedure Evaluation (Signed)
  Anesthesia Post-op Note  Patient: Donna Becker  Procedure(s) Performed: Procedure(s) with comments: FLEXIBLE SIGMOIDOSCOPY (N/A) - no sedation  Patient Location: PACU  Anesthesia Type:Converted to Conscious Sedation.   Level of Consciousness: awake  Airway and Oxygen Therapy: Patient Spontanous Breathing  Post-op Pain: none  Post-op Assessment: Post-op Vital signs reviewed  Post-op Vital Signs: Reviewed  Complications: No apparent anesthesia complications

## 2013-01-26 NOTE — Op Note (Signed)
Procedure: Screening proctocolonoscopy to the splenic flexure  Endoscopist: Danise Edge  Premedication: None. IV access could not be established  Procedure: The patient was placed in the left lateral decubitus position. Anal inspection and digital rectal exam were normal. The Pentax pediatric colonoscope was introduced into the rectum and advanced to the splenic flexure. Colonic preparation for the exam today was good. Intravenous access could not be established and the patient did not receive conscious sedation.  Rectum. Normal. Retroflex view of the distal rectum normal.  Sigmoid colon and descending colon. Normal  Splenic flexure. Normal  Assessment: Normal screening proctocolonoscopy to the splenic flexure.

## 2013-01-26 NOTE — Transfer of Care (Signed)
Immediate Anesthesia Transfer of Care Note  Patient: Donna Becker  Procedure(s) Performed: Procedure(s) with comments: FLEXIBLE SIGMOIDOSCOPY (N/A) - no sedation  Patient Location: PACU  Anesthesia Type:Converted to Conscious Sedation.  Level of Consciousness: awake  Airway & Oxygen Therapy: Patient Spontanous Breathing  Post-op Assessment: Report given to PACU RN  Post vital signs: Reviewed and stable  Complications: No apparent anesthesia complications

## 2013-01-26 NOTE — Anesthesia Preprocedure Evaluation (Addendum)
Anesthesia Evaluation  Patient identified by MRN, date of birth, ID band Patient awake    Reviewed: Allergy & Precautions, H&P , NPO status , Patient's Chart, lab work & pertinent test results, reviewed documented beta blocker date and time   Airway Mallampati: II TM Distance: >3 FB Neck ROM: full    Dental  (+) Edentulous Upper and Dental Advisory Given   Pulmonary neg pulmonary ROS,  breath sounds clear to auscultation  Pulmonary exam normal       Cardiovascular hypertension, Pt. on home beta blockers and Pt. on medications Rhythm:regular Rate:Normal     Neuro/Psych Seizures -, Well Controlled,  Cerebral palsy - hemiplegic - right side weakness. Left arm numbness  Neuromuscular disease negative psych ROS   GI/Hepatic negative GI ROS, Neg liver ROS,   Endo/Other  diabetes  Renal/GU negative Renal ROS  negative genitourinary   Musculoskeletal   Abdominal   Peds  Hematology negative hematology ROS (+)   Anesthesia Other Findings   Reproductive/Obstetrics negative OB ROS                         Anesthesia Physical Anesthesia Plan  ASA: III  Anesthesia Plan: MAC   Post-op Pain Management:    Induction:   Airway Management Planned: Simple Face Mask  Additional Equipment:   Intra-op Plan:   Post-operative Plan:   Informed Consent: I have reviewed the patients History and Physical, chart, labs and discussed the procedure including the risks, benefits and alternatives for the proposed anesthesia with the patient or authorized representative who has indicated his/her understanding and acceptance.   Dental Advisory Given  Plan Discussed with: CRNA and Surgeon  Anesthesia Plan Comments:         Anesthesia Quick Evaluation

## 2013-01-27 ENCOUNTER — Encounter (HOSPITAL_COMMUNITY): Payer: Self-pay | Admitting: Gastroenterology

## 2014-01-21 ENCOUNTER — Ambulatory Visit (INDEPENDENT_AMBULATORY_CARE_PROVIDER_SITE_OTHER): Payer: Medicare PPO | Admitting: Podiatry

## 2014-01-21 ENCOUNTER — Encounter: Payer: Self-pay | Admitting: Podiatry

## 2014-01-21 VITALS — BP 187/89 | HR 66 | Resp 16

## 2014-01-21 DIAGNOSIS — M79673 Pain in unspecified foot: Secondary | ICD-10-CM

## 2014-01-21 DIAGNOSIS — B351 Tinea unguium: Secondary | ICD-10-CM

## 2014-01-21 DIAGNOSIS — E1140 Type 2 diabetes mellitus with diabetic neuropathy, unspecified: Secondary | ICD-10-CM

## 2014-01-21 DIAGNOSIS — E1149 Type 2 diabetes mellitus with other diabetic neurological complication: Secondary | ICD-10-CM

## 2014-01-21 NOTE — Patient Instructions (Signed)
Diabetes and Foot Care Diabetes may cause you to have problems because of poor blood supply (circulation) to your feet and legs. This may cause the skin on your feet to become thinner, break easier, and heal more slowly. Your skin may become dry, and the skin may peel and crack. You may also have nerve damage in your legs and feet causing decreased feeling in them. You may not notice minor injuries to your feet that could lead to infections or more serious problems. Taking care of your feet is one of the most important things you can do for yourself.  HOME CARE INSTRUCTIONS  Wear shoes at all times, even in the house. Do not go barefoot. Bare feet are easily injured.  Check your feet daily for blisters, cuts, and redness. If you cannot see the bottom of your feet, use a mirror or ask someone for help.  Wash your feet with warm water (do not use hot water) and mild soap. Then pat your feet and the areas between your toes until they are completely dry. Do not soak your feet as this can dry your skin.  Apply a moisturizing lotion or petroleum jelly (that does not contain alcohol and is unscented) to the skin on your feet and to dry, brittle toenails. Do not apply lotion between your toes.  Trim your toenails straight across. Do not dig under them or around the cuticle. File the edges of your nails with an emery board or nail file.  Do not cut corns or calluses or try to remove them with medicine.  Wear clean socks or stockings every day. Make sure they are not too tight. Do not wear knee-high stockings since they may decrease blood flow to your legs.  Wear shoes that fit properly and have enough cushioning. To break in new shoes, wear them for just a few hours a day. This prevents you from injuring your feet. Always look in your shoes before you put them on to be sure there are no objects inside.  Do not cross your legs. This may decrease the blood flow to your feet.  If you find a minor scrape,  cut, or break in the skin on your feet, keep it and the skin around it clean and dry. These areas may be cleansed with mild soap and water. Do not cleanse the area with peroxide, alcohol, or iodine.  When you remove an adhesive bandage, be sure not to damage the skin around it.  If you have a wound, look at it several times a day to make sure it is healing.  Do not use heating pads or hot water bottles. They may burn your skin. If you have lost feeling in your feet or legs, you may not know it is happening until it is too late.  Make sure your health care provider performs a complete foot exam at least annually or more often if you have foot problems. Report any cuts, sores, or bruises to your health care provider immediately. SEEK MEDICAL CARE IF:   You have an injury that is not healing.  You have cuts or breaks in the skin.  You have an ingrown nail.  You notice redness on your legs or feet.  You feel burning or tingling in your legs or feet.  You have pain or cramps in your legs and feet.  Your legs or feet are numb.  Your feet always feel cold. SEEK IMMEDIATE MEDICAL CARE IF:   There is increasing redness,   swelling, or pain in or around a wound.  There is a red line that goes up your leg.  Pus is coming from a wound.  You develop a fever or as directed by your health care provider.  You notice a bad smell coming from an ulcer or wound. Document Released: 03/08/2000 Document Revised: 11/11/2012 Document Reviewed: 08/18/2012 ExitCare Patient Information 2015 [employer]. This information is not intended to replace advice given to you by your health care provider. Make sure you discuss any questions you have with your health care provider.  

## 2014-01-21 NOTE — Progress Notes (Signed)
   Subjective:    Patient ID: Donna Becker, female    DOB: 10-Nov-1945, 68 y.o.   MRN: [id number]  HPI Comments: "I need my toenails cut"  Donna Becker, 68 year old female, presents the office today for diabetic risk assessment and painful elongated nails. She states that the nails are painful particularly with shoe gear. She is diabetic however she denies any history of ulceration. She is unsure of her last HbA1c. She also has a history of cerebral palsy. No other complaints at this time.     Review of Systems  Musculoskeletal: Positive for arthralgias and gait problem.  All other systems reviewed and are negative.      Objective:   Physical Exam AAO 3, NAD DP/PT pulses palpable bilaterally, CRT less than 3 seconds Decreased protective sensation with Semmes-Weinstein monofilament, decreased vibratory sensation, decreased Achilles tendon reflex Nails hypertrophic, dystrophic, elongated, discolored 10 without any surrounding erythema or drainage. No open lesions or pre-ulcerative lesions. MMT decreased bilaterally. No calf pain, swelling, warmth, erythema       Assessment & Plan:  69 year old female with symptomatic onychomycosis, diabetic neuropathy -Treatment options discussed including alternatives, risks, complications -Nails sharply debrided 10 without complications. -Discussed the importance of daily foot inspection.  -Follow-up in 3 months, or sooner if any problems arise. Call with any questions/concerns.

## 2014-04-22 ENCOUNTER — Ambulatory Visit: Payer: Medicare PPO | Admitting: Podiatry

## 2014-07-05 DIAGNOSIS — H35033 Hypertensive retinopathy, bilateral: Secondary | ICD-10-CM | POA: Diagnosis not present

## 2014-07-05 DIAGNOSIS — E1139 Type 2 diabetes mellitus with other diabetic ophthalmic complication: Secondary | ICD-10-CM | POA: Diagnosis not present

## 2014-07-05 DIAGNOSIS — H2512 Age-related nuclear cataract, left eye: Secondary | ICD-10-CM | POA: Diagnosis not present

## 2014-07-05 DIAGNOSIS — H4011X3 Primary open-angle glaucoma, severe stage: Secondary | ICD-10-CM | POA: Diagnosis not present

## 2014-09-01 DIAGNOSIS — I10 Essential (primary) hypertension: Secondary | ICD-10-CM | POA: Diagnosis not present

## 2014-09-01 DIAGNOSIS — R809 Proteinuria, unspecified: Secondary | ICD-10-CM | POA: Diagnosis not present

## 2014-09-01 DIAGNOSIS — R569 Unspecified convulsions: Secondary | ICD-10-CM | POA: Diagnosis not present

## 2014-09-01 DIAGNOSIS — M545 Low back pain: Secondary | ICD-10-CM | POA: Diagnosis not present

## 2014-09-01 DIAGNOSIS — E1122 Type 2 diabetes mellitus with diabetic chronic kidney disease: Secondary | ICD-10-CM | POA: Diagnosis not present

## 2014-09-01 DIAGNOSIS — E139 Other specified diabetes mellitus without complications: Secondary | ICD-10-CM | POA: Diagnosis not present

## 2014-09-01 DIAGNOSIS — G809 Cerebral palsy, unspecified: Secondary | ICD-10-CM | POA: Diagnosis not present

## 2014-09-01 DIAGNOSIS — C50919 Malignant neoplasm of unspecified site of unspecified female breast: Secondary | ICD-10-CM | POA: Diagnosis not present

## 2014-09-01 DIAGNOSIS — E782 Mixed hyperlipidemia: Secondary | ICD-10-CM | POA: Diagnosis not present

## 2014-10-14 DIAGNOSIS — E119 Type 2 diabetes mellitus without complications: Secondary | ICD-10-CM | POA: Diagnosis not present

## 2015-01-03 DIAGNOSIS — H16223 Keratoconjunctivitis sicca, not specified as Sjogren's, bilateral: Secondary | ICD-10-CM | POA: Diagnosis not present

## 2015-01-03 DIAGNOSIS — H2512 Age-related nuclear cataract, left eye: Secondary | ICD-10-CM | POA: Diagnosis not present

## 2015-01-03 DIAGNOSIS — E119 Type 2 diabetes mellitus without complications: Secondary | ICD-10-CM | POA: Diagnosis not present

## 2015-01-03 DIAGNOSIS — H401133 Primary open-angle glaucoma, bilateral, severe stage: Secondary | ICD-10-CM | POA: Diagnosis not present

## 2015-01-05 DIAGNOSIS — M4800 Spinal stenosis, site unspecified: Secondary | ICD-10-CM | POA: Diagnosis not present

## 2015-01-05 DIAGNOSIS — I10 Essential (primary) hypertension: Secondary | ICD-10-CM | POA: Diagnosis not present

## 2015-01-05 DIAGNOSIS — E139 Other specified diabetes mellitus without complications: Secondary | ICD-10-CM | POA: Diagnosis not present

## 2015-01-05 DIAGNOSIS — Z0000 Encounter for general adult medical examination without abnormal findings: Secondary | ICD-10-CM | POA: Diagnosis not present

## 2015-01-05 DIAGNOSIS — G809 Cerebral palsy, unspecified: Secondary | ICD-10-CM | POA: Diagnosis not present

## 2015-01-05 DIAGNOSIS — R569 Unspecified convulsions: Secondary | ICD-10-CM | POA: Diagnosis not present

## 2015-01-05 DIAGNOSIS — Z1389 Encounter for screening for other disorder: Secondary | ICD-10-CM | POA: Diagnosis not present

## 2015-01-05 DIAGNOSIS — Z23 Encounter for immunization: Secondary | ICD-10-CM | POA: Diagnosis not present

## 2015-01-05 DIAGNOSIS — E782 Mixed hyperlipidemia: Secondary | ICD-10-CM | POA: Diagnosis not present

## 2015-01-09 ENCOUNTER — Other Ambulatory Visit: Payer: Self-pay | Admitting: Internal Medicine

## 2015-01-09 DIAGNOSIS — R590 Localized enlarged lymph nodes: Secondary | ICD-10-CM

## 2015-01-13 ENCOUNTER — Other Ambulatory Visit: Payer: Self-pay | Admitting: Internal Medicine

## 2015-01-13 DIAGNOSIS — R590 Localized enlarged lymph nodes: Secondary | ICD-10-CM

## 2015-01-13 DIAGNOSIS — R591 Generalized enlarged lymph nodes: Secondary | ICD-10-CM

## 2015-01-16 ENCOUNTER — Ambulatory Visit
Admission: RE | Admit: 2015-01-16 | Discharge: 2015-01-16 | Disposition: A | Discharge status: Home / Self Care | Payer: Medicare PPO | Source: Ambulatory Visit | Attending: Internal Medicine | Admitting: Internal Medicine

## 2015-01-16 DIAGNOSIS — R911 Solitary pulmonary nodule: Secondary | ICD-10-CM | POA: Diagnosis not present

## 2015-01-16 DIAGNOSIS — R591 Generalized enlarged lymph nodes: Secondary | ICD-10-CM

## 2015-01-16 DIAGNOSIS — R590 Localized enlarged lymph nodes: Secondary | ICD-10-CM

## 2015-01-16 MED ORDER — IOPAMIDOL (ISOVUE-300) INJECTION 61%
75.0000 mL | Freq: Once | INTRAVENOUS | Status: AC | PRN
  Administered 2015-01-16: 75 mL via INTRAVENOUS

## 2015-03-24 DIAGNOSIS — E119 Type 2 diabetes mellitus without complications: Secondary | ICD-10-CM | POA: Diagnosis not present

## 2015-05-15 ENCOUNTER — Encounter: Payer: Self-pay | Admitting: Physical Therapy

## 2015-05-15 ENCOUNTER — Ambulatory Visit: Payer: Medicare Other | Attending: Internal Medicine | Admitting: Physical Therapy

## 2015-05-15 DIAGNOSIS — R269 Unspecified abnormalities of gait and mobility: Secondary | ICD-10-CM | POA: Insufficient documentation

## 2015-05-15 DIAGNOSIS — R2681 Unsteadiness on feet: Secondary | ICD-10-CM | POA: Insufficient documentation

## 2015-05-15 DIAGNOSIS — R29898 Other symptoms and signs involving the musculoskeletal system: Secondary | ICD-10-CM

## 2015-05-17 NOTE — Therapy (Signed)
[facility] [address] [place], [place], [address] Phone: [phone]   Fax:  [phone]  Physical Therapy Evaluation  Patient Details  Name: AMARYLLIS WENG MRN: [id number] Date of Birth: [birth year] Referring Provider: Seward Carol, MD  Encounter Date: 05/15/2015      PT End of Session - 05/17/15 2012    Visit Number 1   Date for PT Re-Evaluation 06/23/15   Authorization Type [employer]   Authorization Time Period 05-16-15 - 07-13-15   PT Start Time 1450   PT Stop Time 1531   PT Time Calculation (min) 41 min      Past Medical History  Diagnosis Date  . Hypertension   . Diabetes mellitus without complication ([place])   . Neuromuscular disorder ([facility])     neuropathy, right sided paresis, left arm numbness  . Cancer [facility])     left breast cancer /with retained lymph edema left arm  . Arthritis   . Transfusion history     '90's when had breast cancer surgery  . Glaucoma   . History of shingles   . Seizures ([place])   . Gout   . Cerebral palsy, hemiplegic ([facility])     right sided weakness- ambulates with walker    Past Surgical History  Procedure Laterality Date  . Breast surgery Left     s/p left mastectomy  . Port-a-cath      implantation and removal  . Cholecystectomy      open   . Tubal ligation    . Eye surgery Right     corneal transplant  . Flexible sigmoidoscopy N/A 01/26/2013    Procedure: FLEXIBLE SIGMOIDOSCOPY;  Surgeon: Garlan Fair, MD;  Location: [facility] ENDOSCOPY;  Service: Endoscopy;  Laterality: N/A;  no sedation    There were no vitals filed for this visit.  Visit Diagnosis:  Leg weakness, bilateral - Plan: PT plan of care cert/re-cert  Abnormality of gait - Plan: PT plan of care cert/re-cert  Unsteadiness on feet - Plan: PT plan of care cert/re-cert      Subjective Assessment - 05/17/15 2004    Subjective Pt reports overuse of L side due to R sided weakness due to CP; has used  single point cane for several years; also has quad cane and a RW: Pt reports she has had numerous falls; reports last fall was last Thursday   Pertinent History CP   Patient Stated Goals pt states she doesn't know - is unsure if PT will help; wants to reduce falls   Currently in Pain? No/denies            [facility] PT Assessment - 05/17/15 0001    Assessment   Medical Diagnosis Gait Abnormality; R hemiparesis due to CP   Referring Provider Seward Carol, MD   Onset Date/Surgical Date --  approx. 1 year ago for recurrent falls   Balance Screen   Has the patient fallen in the past 6 months Yes   How many times? 10+   Has the patient had a decrease in activity level because of a fear of falling?  Yes   Is the patient reluctant to leave their home because of a fear of falling?  Yes   [place] residence   Living Arrangements Other relatives   Type of [place] One level   Prior Function   Level of Independence Independent with  basic ADLs;Independent with household mobility with device;Requires assistive device for independence   Ambulation/Gait   Ambulation/Gait Yes   Ambulation/Gait Assistance 4: Min assist   Ambulation Distance (Feet) 75 Feet   Assistive device Straight cane   Gait Pattern Decreased step length - right;Decreased step length - left;Decreased stance time - right;Decreased weight shift to right;Trunk flexed;Wide base of support   Ambulation Surface Level;Indoor   Timed Up and Go Test   Normal TUG (seconds) 1.09  with cane                                PT Long Term Goals - 05/17/15 2020    PT LONG TERM GOAL #1   Title Pt will amb. with RW 100' to decr. fall risk and to incr. safety with household mobility.  (06-13-15)   Baseline Pt using a cane - is very unsafe and at high risk for fall   Time 4   Period Weeks   Status New   PT LONG TERM GOAL #2   Title  Independent in HEP for LE strengthening.   (06-13-15)   Time 4   Period Weeks   Status New   PT LONG TERM GOAL #3   Title Determine most appropriate and safest device for asst. with ambulation - RW vs. rollator, wheelchair, etc.  (06-13-15)   Time 4   Period Weeks   Status New   PT LONG TERM GOAL #4   Title Improve TUG score to </= 45 secs with SBA   Baseline 1.09 secs with cane   Time 4   Period Weeks   Status New               Plan - 05/17/15 2013    Clinical Impression Statement Pt is a 70 year old lady with recurrent falls due to weaknesss in LLE which she attributes to overuse due to R hemiparesis/RLE weakness due to CP.  PMH includes CP with R sided weakness, h/o seizures, neuromuscular disorder with neuropathy in LLE, DM, and HTN.  Pt is at very high risk for falls due to L knee instability and R hemiparesis.                                                                                                                                                                         Pt will benefit from skilled therapeutic intervention in order to improve on the following deficits Abnormal gait;Decreased balance;Decreased mobility;Decreased strength;Difficulty walking;Impaired UE functional use;Decreased coordination;Decreased knowledge of use of DME   Rehab Potential Fair   PT Frequency 2x / week   PT Duration 4 weeks   PT Treatment/Interventions ADLs/Self  Care Home Management;Therapeutic exercise;Therapeutic activities;Functional mobility training;Gait training;DME Instruction;Balance training;Neuromuscular re-education;Patient/family education   PT Next Visit Plan begin HEP for LE strengthening; gait tr with RW to determine safest, most appropriate assistive device   PT Home Exercise Plan strengthening   Consulted and Agree with Plan of Care Patient         Problem List There are no active problems to display for this patient.   Alda Lea, PT 05/17/2015, 8:35  PM  [facility] [address] [place], [place], [address] Phone: [phone]   Fax:  [phone]  Name: JAMIRAH GRAVELL MRN: [id number] Date of Birth: [birth year]

## 2019-02-10 ENCOUNTER — Other Ambulatory Visit (HOSPITAL_COMMUNITY): Payer: Self-pay | Admitting: Internal Medicine

## 2019-02-10 DIAGNOSIS — I358 Other nonrheumatic aortic valve disorders: Secondary | ICD-10-CM

## 2019-02-24 ENCOUNTER — Ambulatory Visit (HOSPITAL_COMMUNITY): Payer: Medicare Other | Attending: Cardiovascular Disease

## 2019-02-24 ENCOUNTER — Other Ambulatory Visit: Payer: Self-pay

## 2019-02-24 DIAGNOSIS — I358 Other nonrheumatic aortic valve disorders: Secondary | ICD-10-CM | POA: Insufficient documentation

## 2019-02-24 MED ORDER — PERFLUTREN LIPID MICROSPHERE
1.0000 mL | INTRAVENOUS | Status: AC | PRN
  Administered 2019-02-24: 1 mL via INTRAVENOUS

## 2019-05-04 NOTE — Progress Notes (Signed)
Cardiology Office Note:   Date:  05/06/2019  NAME:  Donna Becker    MRN: [id number] DOB:  [birth year]   PCP:  Seward Carol, MD  Cardiologist:  Evalina Field, MD   Referring MD: Seward Carol, MD   Chief Complaint  Patient presents with  . Abnormal ECG   History of Present Illness:   Donna Becker is a 74 y.o. female with a hx of diabetes, hypertension who is being seen today for the evaluation of abnormal echocardiogram at the request of Seward Carol, MD.  She recently underwent an echocardiogram for cardiac murmur.  That echocardiogram demonstrated a low normal ejection fraction of 50 to 55% with concern for distal septal hypokinesis.  There is also concern for possible LV noncompaction.  On my review of this echocardiogram, she likely just has conduction disease such as a bundle branch block.  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  She reports no prior history of heart disease.  I do not have a prior EKG tracing.  Her EKG today demonstrates normal sinus rhythm with left bundle branch block.  She reports she was diagnosed with breast cancer in the 90s at [facility].  She underwent extensive surgery as well as radiation therapy.  She is never had any clinical evidence of heart failure or a formal diagnosis of this.  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  She also reports that since her breast cancer diagnosis she has had limited use of her left side.  She requires a walker for assistance.  There is no family history of cardiomyopathy.  She reports no symptoms lower extreme edema, PND, orthopnea.  Her examination is without evidence of volume overload today.  She does report exertional chest pain and shortness of breath the past 3 years.  She reports she can get exertional pressure in her left chest.  The pain goes into her left arm.  She also reports getting  short of breath with activity such as walking around her house.  She does use a walker to get around.  She does not exercise routinely.  She does have significant left upper extremity lymphedema related to her breast cancer therapy.  She is never had a heart attack or stroke.  She reports no lower extremity edema.  Symptoms are worsening over the past 1 month.  She gets them 2-3 times per week.  Triggers for her chest pain shortness of breath and daily activity.  She does not get them at rest.  They are alleviated by rest.  Recent cholesterol profile from primary care physician total cholesterol 180, HDL 58, LDL 95 Contra glycerides 134, A1c 5.8  Past Medical History: Past Medical History:  Diagnosis Date  . Arthritis   . Cancer [facility])    left breast cancer /with retained lymph edema left arm  . Cerebral palsy, hemiplegic (HCC)    right sided weakness- ambulates with walker  . Diabetes mellitus without complication ([place])   . Glaucoma   . Gout   . History of shingles   . Hypertension   . Neuromuscular disorder (HCC)    neuropathy, right sided paresis, left arm numbness  . Seizure ([place])   . Seizures ([place])   . Transfusion history    '90's when had breast cancer surgery    Past Surgical History: Past Surgical History:  Procedure Laterality Date  .  BREAST SURGERY Left    s/p left mastectomy  . CHOLECYSTECTOMY     open   . EYE SURGERY Right    corneal transplant  . FLEXIBLE SIGMOIDOSCOPY N/A 01/26/2013   Procedure: FLEXIBLE SIGMOIDOSCOPY;  Surgeon: Garlan Fair, MD;  Location: [facility] ENDOSCOPY;  Service: Endoscopy;  Laterality: N/A;  no sedation  . port-a-cath     implantation and removal  . TUBAL LIGATION      Current Medications: Current Meds  Medication Sig  . allopurinol (ZYLOPRIM) 100 MG tablet Take 100 mg by mouth daily.  . B Complex-C (B-COMPLEX WITH VITAMIN C) tablet Take 1 tablet by mouth daily.  . bimatoprost (LUMIGAN) 0.01 % SOLN Place 1 drop into both eyes at  bedtime.  . carbamazepine (TEGRETOL) 200 MG tablet Take 200 mg by mouth 3 (three) times daily.  . colchicine 0.6 MG tablet Take 0.6 mg by mouth daily.  . diphenhydrAMINE (BENADRYL) 25 mg capsule Take 25 mg by mouth every 6 (six) hours as needed for itching or allergies.  . fish oil-omega-3 fatty acids 1000 MG capsule Take 1 g by mouth daily.  . furosemide (LASIX) 20 MG tablet Take 20 mg by mouth every morning.  . gabapentin (NEURONTIN) 600 MG tablet Take 600 mg by mouth 3 (three) times daily.  Marland Kitchen glipiZIDE (GLUCOTROL XL) 10 MG 24 hr tablet Take 10 mg by mouth daily.  . hydrALAZINE (APRESOLINE) 50 MG tablet Take 50 mg by mouth 2 (two) times daily.  Marland Kitchen losartan (COZAAR) 100 MG tablet Take 100 mg by mouth every evening.  . meloxicam (MOBIC) 15 MG tablet Take 15 mg by mouth daily.  . metFORMIN (GLUCOPHAGE) 500 MG tablet Take 500 mg by mouth 2 (two) times daily with a meal.  . metoprolol (LOPRESSOR) 100 MG tablet Take 100 mg by mouth 2 (two) times daily.  . Multiple Vitamin (MULTIVITAMIN WITH MINERALS) TABS tablet Take 1 tablet by mouth daily.  . pravastatin (PRAVACHOL) 40 MG tablet Take 40 mg by mouth every evening.  . traMADol (ULTRAM) 50 MG tablet Take 50 mg by mouth every 12 (twelve) hours as needed for pain.     Allergies:    Codeine   Social History: Social History   Socioeconomic History  . Marital status: Single    Spouse name: Not on file  . Number of children: Not on file  . Years of education: Not on file  . Highest education level: Not on file  Occupational History  . Occupation: retired Licensed conveyancer  Tobacco Use  . Smoking status: Former Smoker    Years: 1.00    Quit date: 01/06/1969    Years since quitting: 50.3  . Smokeless tobacco: Never Used  Substance and Sexual Activity  . Alcohol use: No  . Drug use: No  . Sexual activity: Not Currently  Other Topics Concern  . Not on file  Social History Narrative  . Not on file   Social Determinants of Health   Financial  Resource Strain:   . Difficulty of Paying Living Expenses: Not on file  Food Insecurity:   . Worried About Charity fundraiser in the Last Year: Not on file  . Ran Out of Food in the Last Year: Not on file  Transportation Needs:   . Lack of Transportation (Medical): Not on file  . Lack of Transportation (Non-Medical): Not on file  Physical Activity:   . Days of Exercise per Week: Not on file  . Minutes of Exercise per Session:  Not on file  Stress:   . Feeling of Stress : Not on file  Social Connections:   . Frequency of Communication with Friends and Family: Not on file  . Frequency of Social Gatherings with Friends and Family: Not on file  . Attends Religious Services: Not on file  . Active Member of Clubs or Organizations: Not on file  . Attends Archivist Meetings: Not on file  . Marital Status: Not on file     Family History: The patient's family history includes Arthritis in her sister; Hypertension in her mother.  ROS:   All other ROS reviewed and negative. Pertinent positives noted in the HPI.     EKGs/Labs/Other Studies Reviewed:   The following studies were personally reviewed by me today:  EKG:  EKG is ordered today.  The ekg ordered today demonstrates sinus bradycardia, heart rate 53, left bundle branch block noted, and was personally reviewed by me.   TTE 02/24/2019 1. Left ventricular ejection fraction, by visual estimation, is 50 to  55%. The left ventricle has low normal function. Left ventricular septal  wall thickness was mildly increased. There is mildly increased left  ventricular hypertrophy.  2. Definity contrast agent was given IV to delineate the left ventricular  endocardial borders.  3. Mildly dilated left ventricular internal cavity size.  4. Prominent mitral chords and trabeculaitons at mid and apical level  suggeting possible non compaction Distal septal hypokinesis.  5. Global right ventricle has normal systolic function.The right    ventricular size is normal. No increase in right ventricular wall  thickness.  6. Left atrial size was moderately dilated.  7. Right atrial size was normal.  8. The mitral valve is normal in structure. Mild mitral valve  regurgitation.  9. The tricuspid valve is normal in structure. Tricuspid valve  regurgitation is mild.  10. The aortic valve is tricuspid. Aortic valve regurgitation is not  visualized. Mild aortic valve stenosis.  11. There is Moderate calcification of the aortic valve.  12. There is Moderate thickening of the aortic valve.  13. The pulmonic valve was grossly normal. Pulmonic valve regurgitation is  not visualized.  14. Moderately elevated pulmonary artery systolic pressure.   Recent Labs: No results found for requested labs within last 8760 hours.   Recent Lipid Panel    Component Value Date/Time   CHOL 150 12/28/2010 0620   TRIG 97 12/28/2010 0620   HDL 47 12/28/2010 0620   CHOLHDL 3.2 12/28/2010 0620   VLDL 19 12/28/2010 0620   LDLCALC 84 12/28/2010 0620    Physical Exam:   VS:  BP (!) 150/63   Pulse (!) 53   Ht 5' 5.5" (1.664 m)   Wt 189 lb 12.8 oz (86.1 kg)   BMI 31.10 kg/m    Wt Readings from Last 3 Encounters:  05/06/19 189 lb 12.8 oz (86.1 kg)  01/26/13 195 lb (88.5 kg)    General: Well nourished, well developed, in no acute distress Heart: Atraumatic, normal size  Eyes: PEERLA, EOMI  Neck: Supple, no JVD Endocrine: No thryomegaly Cardiac: 2 out of 6 stock ejection murmur s Lungs: Clear to auscultation bilaterally, no wheezing, rhonchi or rales  Abd: Soft, nontender, no hepatomegaly  Ext: No edema, pulses 2+, lymphedema noted in the left upper extremity Musculoskeletal: No deformities, BUE and BLE strength normal and equal Skin: Warm and dry, no rashes   Neuro: Alert and oriented to person, place, time, and situation, CNII-XII grossly intact, no focal deficits  Psych: Normal mood and affect   ASSESSMENT:   Donna Becker is a  74 y.o. female who presents for the following: 1. Chest pain of uncertain etiology   2. Abnormal echocardiogram   3. Shortness of breath   4. LBBB (left bundle branch block)   5. Essential hypertension   6. Nonrheumatic aortic valve stenosis     PLAN:   1. Chest pain of uncertain etiology 2. Abnormal echocardiogram 3. Shortness of breath 4. LBBB (left bundle branch block) -Most recent echocardiogram with EF 50 to 55%.  The wall motion abnormalities on my review are just consistent with a left bundle branch block.  She has no evidence of distal septal hypokinesis that is concerning for me.  I did also review the echocardiogram which mention possible LV noncompaction.  On my review she just has a very prominent papillary muscle.  She has no family history of cardiomyopathy and no symptoms suggest underlying cardiomyopathy.  Nonetheless, we will obtain a BNP as well as BMP today.  We also will obtain a Lexiscan nuclear medicine stress test.  Her exertional chest pain and shortness of breath are concerning in the setting of a newly diagnosed left bundle branch block.  Her picture is clouded by significant lymphedema in the left upper extremity.  This also could be the result of her arm pain as well as left chest pain.  We will proceed with the above work-up, and then if needed we will possibly consider cardiac MRI to further delineate her LV anatomy.  Overall I am not that concerned for LV noncompaction but we will possibly pursue a cardiac MRI after the above work-up.  5. Essential hypertension -A bit elevated today.  No change in medications.  6. Nonrheumatic aortic valve stenosis -Mild on most recent echocardiogram.  Repeat in 3 to 5 years.   Disposition: Return in about 3 months (around 08/03/2019).  Medication Adjustments/Labs and Tests Ordered: Current medicines are reviewed at length with the patient today.  Concerns regarding medicines are outlined above.  Orders Placed This Encounter   Procedures  . Basic metabolic panel  . Brain natriuretic peptide  . MYOCARDIAL PERFUSION IMAGING  . EKG 12-Lead   No orders of the defined types were placed in this encounter.   Patient Instructions  Medication Instructions:  The current medical regimen is effective;  continue present plan and medications.  *If you need a refill on your cardiac medications before your next appointment, please call your pharmacy*  Lab Work: BMET, BNP today  If you have labs (blood work) drawn today and your tests are completely normal, you will receive your results only by: Marland Kitchen MyChart Message (if you have MyChart) OR . A paper copy in the mail If you have any lab test that is abnormal or we need to change your treatment, we will call you to review the results.  Testing/Procedures: Your physician has requested that you have a lexiscan myoview. A cardiac stress test is a cardiological test that measures the heart's ability to respond to external stress in a controlled clinical environment. The stress response is induced by intravenous pharmacological stimulation.  Follow-Up: At [facility], you and your health needs are our priority.  As part of our continuing mission to provide you with exceptional heart care, we have created designated Provider Care Teams.  These Care Teams include your primary Cardiologist (physician) and Advanced Practice Providers (APPs -  Physician Assistants and Nurse Practitioners) who all work together to provide you  with the care you need, when you need it.  Your next appointment:   3 month(s)  The format for your next appointment:   In Person  Provider:   Eleonore Chiquito, MD        Signed, Addison Naegeli. Audie Box, [place]  [address] [place], [place] [address] [phone]  05/06/2019 11:41 AM

## 2019-05-06 ENCOUNTER — Ambulatory Visit (INDEPENDENT_AMBULATORY_CARE_PROVIDER_SITE_OTHER): Payer: Medicare PPO | Admitting: Cardiovascular Disease

## 2019-05-06 ENCOUNTER — Encounter: Payer: Self-pay | Admitting: Cardiovascular Disease

## 2019-05-06 ENCOUNTER — Other Ambulatory Visit: Payer: Self-pay

## 2019-05-06 VITALS — BP 150/63 | HR 53 | Ht 65.5 in | Wt 189.8 lb

## 2019-05-06 DIAGNOSIS — R931 Abnormal findings on diagnostic imaging of heart and coronary circulation: Secondary | ICD-10-CM

## 2019-05-06 DIAGNOSIS — I10 Essential (primary) hypertension: Secondary | ICD-10-CM

## 2019-05-06 DIAGNOSIS — R0602 Shortness of breath: Secondary | ICD-10-CM

## 2019-05-06 DIAGNOSIS — I350 Nonrheumatic aortic (valve) stenosis: Secondary | ICD-10-CM

## 2019-05-06 DIAGNOSIS — I447 Left bundle-branch block, unspecified: Secondary | ICD-10-CM

## 2019-05-06 DIAGNOSIS — R079 Chest pain, unspecified: Secondary | ICD-10-CM | POA: Diagnosis not present

## 2019-05-06 NOTE — Patient Instructions (Signed)
Medication Instructions:  The current medical regimen is effective;  continue present plan and medications.  *If you need a refill on your cardiac medications before your next appointment, please call your pharmacy*  Lab Work: BMET, BNP today  If you have labs (blood work) drawn today and your tests are completely normal, you will receive your results only by: Marland Kitchen MyChart Message (if you have MyChart) OR . A paper copy in the mail If you have any lab test that is abnormal or we need to change your treatment, we will call you to review the results.  Testing/Procedures: Your physician has requested that you have a lexiscan myoview. A cardiac stress test is a cardiological test that measures the heart's ability to respond to external stress in a controlled clinical environment. The stress response is induced by intravenous pharmacological stimulation.  Follow-Up: At [facility], you and your health needs are our priority.  As part of our continuing mission to provide you with exceptional heart care, we have created designated Provider Care Teams.  These Care Teams include your primary Cardiologist (physician) and Advanced Practice Providers (APPs -  Physician Assistants and Nurse Practitioners) who all work together to provide you with the care you need, when you need it.  Your next appointment:   3 month(s)  The format for your next appointment:   In Person  Provider:   Eleonore Chiquito, MD

## 2019-05-07 LAB — BASIC METABOLIC PANEL
BUN/Creatinine Ratio: 35 — ABNORMAL HIGH (ref 12–28)
BUN: 38 mg/dL — ABNORMAL HIGH (ref 8–27)
CO2: 23 mmol/L (ref 20–29)
Calcium: 9.4 mg/dL (ref 8.7–10.3)
Chloride: 101 mmol/L (ref 96–106)
Creatinine, Ser: 1.08 mg/dL — ABNORMAL HIGH (ref 0.57–1.00)
GFR calc Af Amer: 58 mL/min/{1.73_m2} — ABNORMAL LOW (ref >59)
GFR calc non Af Amer: 50 mL/min/{1.73_m2} — ABNORMAL LOW (ref >59)
Glucose: 111 mg/dL — ABNORMAL HIGH (ref 65–99)
Potassium: 4.2 mmol/L (ref 3.5–5.2)
Sodium: 141 mmol/L (ref 134–144)

## 2019-05-07 LAB — BRAIN NATRIURETIC PEPTIDE: BNP: 352.4 pg/mL — ABNORMAL HIGH (ref 0.0–100.0)

## 2019-05-12 ENCOUNTER — Telehealth (HOSPITAL_COMMUNITY): Payer: Self-pay

## 2019-05-12 NOTE — Telephone Encounter (Signed)
Encounter complete. 

## 2019-05-14 ENCOUNTER — Inpatient Hospital Stay (HOSPITAL_COMMUNITY): Admission: RE | Admit: 2019-05-14 | Payer: Medicare PPO | Source: Ambulatory Visit

## 2019-05-21 ENCOUNTER — Telehealth (HOSPITAL_COMMUNITY): Payer: Self-pay

## 2019-05-21 NOTE — Telephone Encounter (Signed)
Encounter complete. 

## 2019-05-26 ENCOUNTER — Ambulatory Visit (HOSPITAL_COMMUNITY)
Admission: RE | Admit: 2019-05-26 | Discharge: 2019-05-26 | Disposition: A | Discharge status: Home / Self Care | Payer: Medicare PPO | Source: Ambulatory Visit | Attending: Internal Medicine | Admitting: Internal Medicine

## 2019-05-26 ENCOUNTER — Other Ambulatory Visit: Payer: Self-pay

## 2019-05-26 DIAGNOSIS — I447 Left bundle-branch block, unspecified: Secondary | ICD-10-CM | POA: Diagnosis not present

## 2019-05-26 DIAGNOSIS — Z6830 Body mass index (BMI) 30.0-30.9, adult: Secondary | ICD-10-CM | POA: Insufficient documentation

## 2019-05-26 DIAGNOSIS — I10 Essential (primary) hypertension: Secondary | ICD-10-CM | POA: Diagnosis not present

## 2019-05-26 DIAGNOSIS — G809 Cerebral palsy, unspecified: Secondary | ICD-10-CM | POA: Diagnosis not present

## 2019-05-26 DIAGNOSIS — Z87891 Personal history of nicotine dependence: Secondary | ICD-10-CM | POA: Insufficient documentation

## 2019-05-26 DIAGNOSIS — R931 Abnormal findings on diagnostic imaging of heart and coronary circulation: Secondary | ICD-10-CM | POA: Diagnosis present

## 2019-05-26 DIAGNOSIS — E669 Obesity, unspecified: Secondary | ICD-10-CM | POA: Diagnosis not present

## 2019-05-26 DIAGNOSIS — Z853 Personal history of malignant neoplasm of breast: Secondary | ICD-10-CM | POA: Insufficient documentation

## 2019-05-26 DIAGNOSIS — R0602 Shortness of breath: Secondary | ICD-10-CM

## 2019-05-26 DIAGNOSIS — E119 Type 2 diabetes mellitus without complications: Secondary | ICD-10-CM | POA: Insufficient documentation

## 2019-05-26 DIAGNOSIS — R079 Chest pain, unspecified: Secondary | ICD-10-CM | POA: Insufficient documentation

## 2019-05-26 DIAGNOSIS — R0609 Other forms of dyspnea: Secondary | ICD-10-CM | POA: Insufficient documentation

## 2019-05-26 LAB — MYOCARDIAL PERFUSION IMAGING
LV dias vol: 81 mL (ref 46–106)
LV sys vol: 37 mL
Peak HR: 75 {beats}/min
Rest HR: 58 {beats}/min
SDS: 0
SRS: 0
SSS: 0
TID: 0.59

## 2019-05-26 MED ORDER — TECHNETIUM TC 99M TETROFOSMIN IV KIT
10.9000 | PACK | Freq: Once | INTRAVENOUS | Status: AC | PRN
  Administered 2019-05-26: 9.8 via INTRAVENOUS
  Filled 2019-05-26: qty 11

## 2019-05-26 MED ORDER — ADENOSINE (DIAGNOSTIC) 3 MG/ML IV SOLN
0.5600 mg/kg | Freq: Once | INTRAVENOUS | Status: AC
  Administered 2019-05-26: 48 mg via INTRAVENOUS

## 2019-05-26 MED ORDER — TECHNETIUM TC 99M TETROFOSMIN IV KIT
28.4000 | PACK | Freq: Once | INTRAVENOUS | Status: AC | PRN
  Administered 2019-05-26: 29.2 via INTRAVENOUS
  Filled 2019-05-26: qty 29

## 2019-07-06 ENCOUNTER — Other Ambulatory Visit: Payer: Self-pay | Admitting: Internal Medicine

## 2019-07-06 DIAGNOSIS — R42 Dizziness and giddiness: Secondary | ICD-10-CM

## 2019-07-06 DIAGNOSIS — R569 Unspecified convulsions: Secondary | ICD-10-CM | POA: Diagnosis not present

## 2019-07-06 DIAGNOSIS — D649 Anemia, unspecified: Secondary | ICD-10-CM | POA: Diagnosis not present

## 2019-07-08 ENCOUNTER — Other Ambulatory Visit: Payer: Self-pay | Admitting: Internal Medicine

## 2019-07-08 DIAGNOSIS — R42 Dizziness and giddiness: Secondary | ICD-10-CM

## 2019-07-13 DIAGNOSIS — R569 Unspecified convulsions: Secondary | ICD-10-CM | POA: Diagnosis not present

## 2019-07-13 DIAGNOSIS — R42 Dizziness and giddiness: Secondary | ICD-10-CM | POA: Diagnosis not present

## 2019-07-13 DIAGNOSIS — W19XXXD Unspecified fall, subsequent encounter: Secondary | ICD-10-CM | POA: Diagnosis not present

## 2019-07-13 DIAGNOSIS — D649 Anemia, unspecified: Secondary | ICD-10-CM | POA: Diagnosis not present

## 2019-07-13 DIAGNOSIS — G809 Cerebral palsy, unspecified: Secondary | ICD-10-CM | POA: Diagnosis not present

## 2019-07-13 DIAGNOSIS — R8290 Unspecified abnormal findings in urine: Secondary | ICD-10-CM | POA: Diagnosis not present

## 2019-07-15 DIAGNOSIS — D649 Anemia, unspecified: Secondary | ICD-10-CM | POA: Diagnosis not present

## 2019-07-20 ENCOUNTER — Ambulatory Visit
Admission: RE | Admit: 2019-07-20 | Discharge: 2019-07-20 | Disposition: A | Discharge status: Home / Self Care | Payer: Medicare PPO | Source: Ambulatory Visit | Attending: Internal Medicine | Admitting: Internal Medicine

## 2019-07-20 DIAGNOSIS — R42 Dizziness and giddiness: Secondary | ICD-10-CM

## 2019-07-23 ENCOUNTER — Encounter: Payer: Self-pay | Admitting: Neurology

## 2019-08-05 DIAGNOSIS — E1169 Type 2 diabetes mellitus with other specified complication: Secondary | ICD-10-CM | POA: Diagnosis not present

## 2019-08-05 DIAGNOSIS — M67919 Unspecified disorder of synovium and tendon, unspecified shoulder: Secondary | ICD-10-CM | POA: Diagnosis not present

## 2019-08-05 DIAGNOSIS — E7800 Pure hypercholesterolemia, unspecified: Secondary | ICD-10-CM | POA: Diagnosis not present

## 2019-08-05 DIAGNOSIS — R569 Unspecified convulsions: Secondary | ICD-10-CM | POA: Diagnosis not present

## 2019-08-05 DIAGNOSIS — G809 Cerebral palsy, unspecified: Secondary | ICD-10-CM | POA: Diagnosis not present

## 2019-08-05 DIAGNOSIS — D649 Anemia, unspecified: Secondary | ICD-10-CM | POA: Diagnosis not present

## 2019-08-05 DIAGNOSIS — I10 Essential (primary) hypertension: Secondary | ICD-10-CM | POA: Diagnosis not present

## 2019-08-05 DIAGNOSIS — R42 Dizziness and giddiness: Secondary | ICD-10-CM | POA: Diagnosis not present

## 2019-08-05 DIAGNOSIS — W19XXXD Unspecified fall, subsequent encounter: Secondary | ICD-10-CM | POA: Diagnosis not present

## 2019-08-05 NOTE — Progress Notes (Signed)
Cardiology Office Note:   Date:  08/06/2019  NAME:  Donna Becker    MRN: [id number] DOB:  Apr 16, 1945   PCP:  Seward Carol, MD  Cardiologist:  Evalina Field, MD   Referring MD: Seward Carol, MD   Chief Complaint  Patient presents with  . Follow-up   History of Present Illness:   Donna Becker is a 74 y.o. female with a diabetes, HTN, LBBB who presents for follow-up. Evaluated 04/2019 for CP in setting of LBBB. Echo with low normal LV function and concerns for LV non-compaction.  Review of her echo does show some hyper trabeculation but normal LV function.  Her wall motion is related to her left bundle.  She is without any symptoms of congestive heart failure.  Overall I think this is inconsistent with a diagnosis of LV noncompaction.  She is reassured that her stress test was normal.  There is no evidence of any obstructive CAD that I can tell.  She is active with a walker and describes no chest pain or shortness of breath.  She does report intermittent chest pain that can occur while sitting.  Described as sharp central pain the last seconds.  Occurs every few weeks.  Likely noncardiac.  Her echocardiogram shows mild aortic stenosis with a V-max 2.5 m/s and a mean gradient 13 mmHg.  She does have a murmur on examination.  No symptoms from this.  We will monitor this with echo every 3 to 5 years.  Blood pressure 140/86.  Problem List 1. Diabetes -A1c 5.8 -T chol 180, HDL 58, LDL 95, TG 134 2. HTN 3. LBBB -EF 50-55% 4. Breast CA -lymphedema  5. Mild AS -V max 2.5 m/s, MG 13 mmHG  Past Medical History: Past Medical History:  Diagnosis Date  . Arthritis   . Cancer [facility])    left breast cancer /with retained lymph edema left arm  . Cerebral palsy, hemiplegic (HCC)    right sided weakness- ambulates with walker  . Diabetes mellitus without complication ([place])   . Glaucoma   . Gout   . History of shingles   . Hypertension   . Neuromuscular disorder (HCC)     neuropathy, right sided paresis, left arm numbness  . Seizure ([place])   . Seizures ([place])   . Transfusion history    '90's when had breast cancer surgery    Past Surgical History: Past Surgical History:  Procedure Laterality Date  . BREAST SURGERY Left    s/p left mastectomy  . CHOLECYSTECTOMY     open   . EYE SURGERY Right    corneal transplant  . FLEXIBLE SIGMOIDOSCOPY N/A 01/26/2013   Procedure: FLEXIBLE SIGMOIDOSCOPY;  Surgeon: Garlan Fair, MD;  Location: [facility];  Service: Endoscopy;  Laterality: N/A;  no sedation  . port-a-cath     implantation and removal  . TUBAL LIGATION      Current Medications: Current Meds  Medication Sig  . allopurinol (ZYLOPRIM) 100 MG tablet Take 100 mg by mouth daily.  . B Complex-C (B-COMPLEX WITH VITAMIN C) tablet Take 1 tablet by mouth daily.  . bimatoprost (LUMIGAN) 0.01 % SOLN Place 1 drop into both eyes at bedtime.  . carbamazepine (TEGRETOL) 200 MG tablet Take 200 mg by mouth 3 (three) times daily.  . colchicine 0.6 MG tablet Take 0.6 mg by mouth daily.  . diphenhydrAMINE (BENADRYL) 25 mg capsule Take 25 mg by mouth every 6 (six) hours as needed for itching or allergies.  Marland Kitchen  furosemide (LASIX) 20 MG tablet Take 20 mg by mouth every morning.  . gabapentin (NEURONTIN) 600 MG tablet Take 600 mg by mouth 3 (three) times daily.  Marland Kitchen glipiZIDE (GLUCOTROL XL) 10 MG 24 hr tablet Take 10 mg by mouth daily.  . hydrALAZINE (APRESOLINE) 50 MG tablet Take 50 mg by mouth 2 (two) times daily.  Marland Kitchen losartan (COZAAR) 100 MG tablet Take 100 mg by mouth every evening.  . meloxicam (MOBIC) 15 MG tablet Take 15 mg by mouth daily.  . metFORMIN (GLUCOPHAGE) 500 MG tablet Take 500 mg by mouth 2 (two) times daily with a meal.  . metoprolol (LOPRESSOR) 100 MG tablet Take 100 mg by mouth 2 (two) times daily.  . Multiple Vitamin (MULTIVITAMIN WITH MINERALS) TABS tablet Take 1 tablet by mouth daily.  . pravastatin (PRAVACHOL) 40 MG tablet Take 40 mg by mouth every  evening.     Allergies:    Codeine   Social History: Social History   Socioeconomic History  . Marital status: Single    Spouse name: Not on file  . Number of children: Not on file  . Years of education: Not on file  . Highest education level: Not on file  Occupational History  . Occupation: retired Licensed conveyancer  Tobacco Use  . Smoking status: Former Smoker    Years: 1.00    Quit date: 01/06/1969    Years since quitting: 50.6  . Smokeless tobacco: Never Used  Substance and Sexual Activity  . Alcohol use: No  . Drug use: No  . Sexual activity: Not Currently  Other Topics Concern  . Not on file  Social History Narrative  . Not on file   Social Determinants of Health   Financial Resource Strain:   . Difficulty of Paying Living Expenses:   Food Insecurity:   . Worried About Charity fundraiser in the Last Year:   . Arboriculturist in the Last Year:   Transportation Needs:   . Film/video editor (Medical):   Marland Kitchen Lack of Transportation (Non-Medical):   Physical Activity:   . Days of Exercise per Week:   . Minutes of Exercise per Session:   Stress:   . Feeling of Stress :   Social Connections:   . Frequency of Communication with Friends and Family:   . Frequency of Social Gatherings with Friends and Family:   . Attends Religious Services:   . Active Member of Clubs or Organizations:   . Attends Archivist Meetings:   Marland Kitchen Marital Status:      Family History: The patient's family history includes Arthritis in her sister; Hypertension in her mother.  ROS:   All other ROS reviewed and negative. Pertinent positives noted in the HPI.     EKGs/Labs/Other Studies Reviewed:   The following studies were personally reviewed by me today:  TTE 02/24/2019 1. Left ventricular ejection fraction, by visual estimation, is 50 to  55%. The left ventricle has low normal function. Left ventricular septal  wall thickness was mildly increased. There is mildly increased  left  ventricular hypertrophy.  2. Definity contrast agent was given IV to delineate the left ventricular  endocardial borders.  3. Mildly dilated left ventricular internal cavity size.  4. Prominent mitral chords and trabeculaitons at mid and apical level  suggeting possible non compaction Distal septal hypokinesis.  5. Global right ventricle has normal systolic function.The right  ventricular size is normal. No increase in right ventricular wall  thickness.  6. Left atrial size was moderately dilated.  7. Right atrial size was normal.  8. The mitral valve is normal in structure. Mild mitral valve  regurgitation.  9. The tricuspid valve is normal in structure. Tricuspid valve  regurgitation is mild.  10. The aortic valve is tricuspid. Aortic valve regurgitation is not  visualized. Mild aortic valve stenosis.  11. There is Moderate calcification of the aortic valve.  12. There is Moderate thickening of the aortic valve.  13. The pulmonic valve was grossly normal. Pulmonic valve regurgitation is  not visualized.  14. Moderately elevated pulmonary artery systolic pressure.  NM Stress 05/26/2019 Fixed inferior defect with normal wall motion in inferior wall, consistent with artifact.  Fixed septal defect could represent prior infarct or artifact related to LBBB.  Recent Labs: 05/06/2019: BNP 352.4; BUN 38; Creatinine, Ser 1.08; Potassium 4.2; Sodium 141   Recent Lipid Panel    Component Value Date/Time   CHOL 150 12/28/2010 0620   TRIG 97 12/28/2010 0620   HDL 47 12/28/2010 0620   CHOLHDL 3.2 12/28/2010 0620   VLDL 19 12/28/2010 0620   LDLCALC 84 12/28/2010 0620    Physical Exam:   VS:  BP 140/82   Pulse (!) 56   Ht 5\' 5"  (1.651 m)   Wt 180 lb 9.6 oz (81.9 kg)   SpO2 98%   BMI 30.05 kg/m    Wt Readings from Last 3 Encounters:  08/06/19 180 lb 9.6 oz (81.9 kg)  05/26/19 189 lb (85.7 kg)  05/06/19 189 lb 12.8 oz (86.1 kg)    General: Well nourished, well  developed, in no acute distress Heart: Atraumatic, normal size  Eyes: PEERLA, EOMI  Neck: Supple, no JVD Endocrine: No thryomegaly Cardiac: Normal S1, S2; RRR; 2/6 SEM Lungs: Clear to auscultation bilaterally, no wheezing, rhonchi or rales  Abd: Soft, nontender, no hepatomegaly  Ext: No edema, pulses 2+, lymphedema noted in the left upper extremity Musculoskeletal: No deformities, BUE and BLE strength normal and equal Skin: Warm and dry, no rashes   Neuro: Alert and oriented to person, place, time, and situation, CNII-XII grossly intact, no focal deficits  Psych: Normal mood and affect   ASSESSMENT:   Donna Becker is a 74 y.o. female who presents for the following: 1. Chest pain, unspecified type   2. Essential hypertension   3. Mixed hyperlipidemia   4. Nonrheumatic aortic valve stenosis   5. LVH (left ventricular hypertrophy)     PLAN:   1. Chest pain, unspecified type -Atypical chest pain.  Recent nuclear medicine stress test normal.  Her symptoms are sharp and infrequent.  Likely noncardiac.  2. Essential hypertension -Blood pressure well controlled.  We will continue current medications.  3. Mixed hyperlipidemia -Most recent LDL 95.  No change in current medications.  She takes pravastatin.  4. Nonrheumatic aortic valve stenosis -Mild aortic stenosis on most recent echocardiogram.  Recheck echocardiogram in 3 to 5 years.  5. LVH (left ventricular hypertrophy) -She does have hyper trabeculations with low normal left ventricular function.  Her ejection fraction is 50-55%.  The wall motion seen on her echocardiogram is more consistent with a left bundle branch block.  Given her normal stress test I think she does not have obstructive CAD.  Given that her LV function is normal and she has no symptoms of cardiomyopathy, I do not believe she carries a diagnosis of LV noncompaction.  I did discuss this with her and I recommended no further testing.  She  is in  agreement.  Disposition: Return in about 1 year (around 08/05/2020).  Medication Adjustments/Labs and Tests Ordered: Current medicines are reviewed at length with the patient today.  Concerns regarding medicines are outlined above.  No orders of the defined types were placed in this encounter.  No orders of the defined types were placed in this encounter.   Patient Instructions  Medication Instructions:  The current medical regimen is effective;  continue present plan and medications.  *If you need a refill on your cardiac medications before your next appointment, please call your pharmacy*   Follow-Up: At [facility], you and your health needs are our priority.  As part of our continuing mission to provide you with exceptional heart care, we have created designated Provider Care Teams.  These Care Teams include your primary Cardiologist (physician) and Advanced Practice Providers (APPs -  Physician Assistants and Nurse Practitioners) who all work together to provide you with the care you need, when you need it.  We recommend signing up for the patient portal called "MyChart".  Sign up information is provided on this After Visit Summary.  MyChart is used to connect with patients for Virtual Visits (Telemedicine).  Patients are able to view lab/test results, encounter notes, upcoming appointments, etc.  Non-urgent messages can be sent to your provider as well.   To learn more about what you can do with MyChart, go to [email or web address].    Your next appointment:   12 month(s)  The format for your next appointment:   In Person  Provider:   Eleonore Chiquito, MD     Time Spent with Patient: I have spent a total of 25 minutes with patient reviewing hospital notes, telemetry, EKGs, labs and examining the patient as well as establishing an assessment and plan that was discussed with the patient.  > 50% of time was spent in direct patient care.  Signed, Addison Naegeli. Audie Box, [place]  [address], [place], [place] [address] [phone]  08/06/2019 10:58 AM

## 2019-08-06 ENCOUNTER — Encounter: Payer: Self-pay | Admitting: Cardiovascular Disease

## 2019-08-06 ENCOUNTER — Ambulatory Visit: Payer: Medicare PPO | Admitting: Cardiovascular Disease

## 2019-08-06 ENCOUNTER — Other Ambulatory Visit: Payer: Self-pay

## 2019-08-06 VITALS — BP 140/82 | HR 56 | Ht 65.0 in | Wt 180.6 lb

## 2019-08-06 DIAGNOSIS — I517 Cardiomegaly: Secondary | ICD-10-CM

## 2019-08-06 DIAGNOSIS — E782 Mixed hyperlipidemia: Secondary | ICD-10-CM | POA: Diagnosis not present

## 2019-08-06 DIAGNOSIS — I10 Essential (primary) hypertension: Secondary | ICD-10-CM

## 2019-08-06 DIAGNOSIS — R079 Chest pain, unspecified: Secondary | ICD-10-CM | POA: Diagnosis not present

## 2019-08-06 DIAGNOSIS — I350 Nonrheumatic aortic (valve) stenosis: Secondary | ICD-10-CM

## 2019-08-06 NOTE — Patient Instructions (Signed)

## 2019-08-27 ENCOUNTER — Other Ambulatory Visit: Payer: Self-pay | Admitting: Gastroenterology

## 2019-08-27 DIAGNOSIS — R195 Other fecal abnormalities: Secondary | ICD-10-CM | POA: Diagnosis not present

## 2019-08-27 DIAGNOSIS — D509 Iron deficiency anemia, unspecified: Secondary | ICD-10-CM | POA: Diagnosis not present

## 2019-09-14 ENCOUNTER — Encounter: Payer: Self-pay | Admitting: Neurology

## 2019-09-22 ENCOUNTER — Other Ambulatory Visit: Payer: Self-pay | Admitting: Gastroenterology

## 2019-09-25 ENCOUNTER — Other Ambulatory Visit (HOSPITAL_COMMUNITY)
Admission: RE | Admit: 2019-09-25 | Discharge: 2019-09-25 | Disposition: A | Discharge status: Home / Self Care | Payer: Medicare PPO | Source: Ambulatory Visit | Attending: Gastroenterology | Admitting: Gastroenterology

## 2019-09-25 DIAGNOSIS — Z01812 Encounter for preprocedural laboratory examination: Secondary | ICD-10-CM | POA: Insufficient documentation

## 2019-09-25 DIAGNOSIS — Z20822 Contact with and (suspected) exposure to covid-19: Secondary | ICD-10-CM | POA: Diagnosis not present

## 2019-09-25 LAB — SARS CORONAVIRUS 2 (TAT 6-24 HRS): SARS Coronavirus 2: NEGATIVE

## 2019-09-28 NOTE — Anesthesia Preprocedure Evaluation (Addendum)
Anesthesia Evaluation  Patient identified by MRN, date of birth, ID band Patient awake    Reviewed: Allergy & Precautions, NPO status , Patient's Chart, lab work & pertinent test results  Airway Mallampati: II  TM Distance: >3 FB Neck ROM: Full    Dental  (+) Upper Dentures, Dental Advisory Given   Pulmonary former smoker,    breath sounds clear to auscultation       Cardiovascular hypertension, Pt. on medications and Pt. on home beta blockers  Rhythm:Regular Rate:Normal     Neuro/Psych Seizures -,   Neuromuscular disease negative psych ROS   GI/Hepatic negative GI ROS, Neg liver ROS,   Endo/Other  diabetes, Type 2, Oral Hypoglycemic Agents  Renal/GU negative Renal ROS     Musculoskeletal  (+) Arthritis ,   Abdominal Normal abdominal exam  (+)   Peds  Hematology negative hematology ROS (+)   Anesthesia Other Findings   Reproductive/Obstetrics                            Anesthesia Physical Anesthesia Plan  ASA: II  Anesthesia Plan: MAC   Post-op Pain Management:    Induction: Intravenous  PONV Risk Score and Plan: 0 and Propofol infusion  Airway Management Planned: Natural Airway and Nasal Cannula  Additional Equipment: None  Intra-op Plan:   Post-operative Plan:   Informed Consent: I have reviewed the patients History and Physical, chart, labs and discussed the procedure including the risks, benefits and alternatives for the proposed anesthesia with the patient or authorized representative who has indicated his/her understanding and acceptance.     Dental advisory given  Plan Discussed with: CRNA  Anesthesia Plan Comments:        Anesthesia Quick Evaluation

## 2019-09-29 ENCOUNTER — Encounter (HOSPITAL_COMMUNITY): Payer: Self-pay | Admitting: Gastroenterology

## 2019-09-29 ENCOUNTER — Ambulatory Visit (HOSPITAL_COMMUNITY)
Admission: RE | Admit: 2019-09-29 | Discharge: 2019-09-29 | Disposition: A | Discharge status: Home / Self Care | Payer: Medicare PPO | Attending: Gastroenterology | Admitting: Gastroenterology

## 2019-09-29 ENCOUNTER — Ambulatory Visit (HOSPITAL_COMMUNITY): Payer: Medicare PPO | Admitting: Anesthesiology

## 2019-09-29 ENCOUNTER — Other Ambulatory Visit: Payer: Self-pay

## 2019-09-29 ENCOUNTER — Encounter (HOSPITAL_COMMUNITY)
Admission: RE | Disposition: A | Discharge status: Home / Self Care | Payer: Self-pay | Source: Home / Self Care | Attending: Gastroenterology

## 2019-09-29 DIAGNOSIS — Z79899 Other long term (current) drug therapy: Secondary | ICD-10-CM | POA: Insufficient documentation

## 2019-09-29 DIAGNOSIS — K2900 Acute gastritis without bleeding: Secondary | ICD-10-CM | POA: Insufficient documentation

## 2019-09-29 DIAGNOSIS — R569 Unspecified convulsions: Secondary | ICD-10-CM | POA: Diagnosis not present

## 2019-09-29 DIAGNOSIS — I10 Essential (primary) hypertension: Secondary | ICD-10-CM | POA: Insufficient documentation

## 2019-09-29 DIAGNOSIS — Z885 Allergy status to narcotic agent status: Secondary | ICD-10-CM | POA: Insufficient documentation

## 2019-09-29 DIAGNOSIS — Z7984 Long term (current) use of oral hypoglycemic drugs: Secondary | ICD-10-CM | POA: Diagnosis not present

## 2019-09-29 DIAGNOSIS — K2970 Gastritis, unspecified, without bleeding: Secondary | ICD-10-CM | POA: Diagnosis not present

## 2019-09-29 DIAGNOSIS — Z8673 Personal history of transient ischemic attack (TIA), and cerebral infarction without residual deficits: Secondary | ICD-10-CM | POA: Diagnosis not present

## 2019-09-29 DIAGNOSIS — K640 First degree hemorrhoids: Secondary | ICD-10-CM | POA: Insufficient documentation

## 2019-09-29 DIAGNOSIS — D125 Benign neoplasm of sigmoid colon: Secondary | ICD-10-CM | POA: Diagnosis not present

## 2019-09-29 DIAGNOSIS — K921 Melena: Secondary | ICD-10-CM | POA: Diagnosis not present

## 2019-09-29 DIAGNOSIS — G809 Cerebral palsy, unspecified: Secondary | ICD-10-CM | POA: Diagnosis not present

## 2019-09-29 DIAGNOSIS — D124 Benign neoplasm of descending colon: Secondary | ICD-10-CM | POA: Diagnosis not present

## 2019-09-29 DIAGNOSIS — R195 Other fecal abnormalities: Secondary | ICD-10-CM | POA: Diagnosis not present

## 2019-09-29 DIAGNOSIS — D509 Iron deficiency anemia, unspecified: Secondary | ICD-10-CM | POA: Diagnosis present

## 2019-09-29 DIAGNOSIS — Z87891 Personal history of nicotine dependence: Secondary | ICD-10-CM | POA: Diagnosis not present

## 2019-09-29 DIAGNOSIS — D122 Benign neoplasm of ascending colon: Secondary | ICD-10-CM | POA: Diagnosis not present

## 2019-09-29 DIAGNOSIS — M1990 Unspecified osteoarthritis, unspecified site: Secondary | ICD-10-CM | POA: Insufficient documentation

## 2019-09-29 DIAGNOSIS — D123 Benign neoplasm of transverse colon: Secondary | ICD-10-CM | POA: Insufficient documentation

## 2019-09-29 DIAGNOSIS — Z853 Personal history of malignant neoplasm of breast: Secondary | ICD-10-CM | POA: Insufficient documentation

## 2019-09-29 DIAGNOSIS — D126 Benign neoplasm of colon, unspecified: Secondary | ICD-10-CM | POA: Diagnosis not present

## 2019-09-29 DIAGNOSIS — E1136 Type 2 diabetes mellitus with diabetic cataract: Secondary | ICD-10-CM | POA: Diagnosis not present

## 2019-09-29 DIAGNOSIS — E7800 Pure hypercholesterolemia, unspecified: Secondary | ICD-10-CM | POA: Insufficient documentation

## 2019-09-29 DIAGNOSIS — K635 Polyp of colon: Secondary | ICD-10-CM | POA: Diagnosis not present

## 2019-09-29 DIAGNOSIS — M109 Gout, unspecified: Secondary | ICD-10-CM | POA: Diagnosis not present

## 2019-09-29 DIAGNOSIS — K449 Diaphragmatic hernia without obstruction or gangrene: Secondary | ICD-10-CM | POA: Insufficient documentation

## 2019-09-29 HISTORY — PX: COLONOSCOPY WITH PROPOFOL: SHX5780

## 2019-09-29 HISTORY — PX: BIOPSY: SHX5522

## 2019-09-29 HISTORY — PX: ESOPHAGOGASTRODUODENOSCOPY (EGD) WITH PROPOFOL: SHX5813

## 2019-09-29 HISTORY — PX: POLYPECTOMY: SHX5525

## 2019-09-29 LAB — GLUCOSE, CAPILLARY: Glucose-Capillary: 94 mg/dL (ref 70–99)

## 2019-09-29 SURGERY — COLONOSCOPY WITH PROPOFOL
Anesthesia: Monitor Anesthesia Care

## 2019-09-29 MED ORDER — LACTATED RINGERS IV SOLN: INTRAVENOUS | Status: DC

## 2019-09-29 MED ORDER — PROPOFOL 10 MG/ML IV BOLUS
INTRAVENOUS | Status: DC | PRN
  Administered 2019-09-29: 20 mg via INTRAVENOUS

## 2019-09-29 MED ORDER — PROPOFOL 10 MG/ML IV BOLUS
INTRAVENOUS | Status: AC
  Filled 2019-09-29: qty 20

## 2019-09-29 MED ORDER — PROPOFOL 500 MG/50ML IV EMUL
INTRAVENOUS | Status: DC | PRN
  Administered 2019-09-29: 135 ug/kg/min via INTRAVENOUS

## 2019-09-29 MED ORDER — EPHEDRINE SULFATE-NACL 50-0.9 MG/10ML-% IV SOSY
PREFILLED_SYRINGE | INTRAVENOUS | Status: DC | PRN
  Administered 2019-09-29 (×3): 5 mg via INTRAVENOUS

## 2019-09-29 MED ORDER — SODIUM CHLORIDE 0.9 % IV SOLN: INTRAVENOUS | Status: DC

## 2019-09-29 MED ORDER — PROPOFOL 500 MG/50ML IV EMUL
INTRAVENOUS | Status: AC
  Filled 2019-09-29: qty 50

## 2019-09-29 MED ORDER — LIDOCAINE 2% (20 MG/ML) 5 ML SYRINGE
INTRAMUSCULAR | Status: DC | PRN
  Administered 2019-09-29: 60 mg via INTRAVENOUS

## 2019-09-29 SURGICAL SUPPLY — 24 items

## 2019-09-29 NOTE — H&P (Signed)
Date of Initial H&P: 09/22/19  History reviewed, patient examined, no change in status, stable for surgery.

## 2019-09-29 NOTE — Interval H&P Note (Signed)
History and Physical Interval Note:  09/29/2019 8:41 AM  Donna Becker  has presented today for surgery, with the diagnosis of Heme positive stool/iron deficiency anemia.  The various methods of treatment have been discussed with the patient and family. After consideration of risks, benefits and other options for treatment, the patient has consented to  Procedure(s): COLONOSCOPY WITH PROPOFOL (N/A) ESOPHAGOGASTRODUODENOSCOPY (EGD) WITH PROPOFOL (N/A) as a surgical intervention.  The patient's history has been reviewed, patient examined, no change in status, stable for surgery.  I have reviewed the patient's chart and labs.  Questions were answered to the patient's satisfaction.     Lear Ng

## 2019-09-29 NOTE — Op Note (Signed)
[facility] Patient Name: Donna Becker Procedure Date: 09/29/2019 MRN: [id number] Attending MD: Lear Ng , MD Date of Birth: 12/04/1945 CSN: [id number] Age: 74 Admit Type: Outpatient Procedure:                Colonoscopy Indications:              Heme positive stool, Iron deficiency anemia Providers:                Lear Ng, MD, Jeanella Cara, RN,                            Laverda Sorenson, Technician, Courtney Heys. [place], CRNA Referring MD:             Seward Carol Medicines:                Propofol per Anesthesia, Monitored Anesthesia Care Complications:            No immediate complications. Estimated Blood Loss:     Estimated blood loss was minimal. Procedure:                Pre-Anesthesia Assessment:                           - Prior to the procedure, a History and Physical                            was performed, and patient medications and                            allergies were reviewed. The patient's tolerance of                            previous anesthesia was also reviewed. The risks                            and benefits of the procedure and the sedation                            options and risks were discussed with the patient.                            All questions were answered, and informed consent                            was obtained. Prior Anticoagulants: The patient has                            taken no previous anticoagulant or antiplatelet                            agents. ASA Grade Assessment: III - A patient with                            severe systemic disease. After reviewing the risks  and benefits, the patient was deemed in                            satisfactory condition to undergo the procedure.                           After obtaining informed consent, the colonoscope                            was passed under direct vision. Throughout the                             procedure, the patient's blood pressure, pulse, and                            oxygen saturations were monitored continuously. The                            [id number]-H190DL ([id number] Olympus pediatric colonscope                            was introduced through the anus and advanced to the                            the cecum, identified by appendiceal orifice and                            ileocecal valve. The colonoscopy was performed with                            difficulty due to inadequate bowel prep,                            significant looping and a tortuous colon.                            Successful completion of the procedure was aided by                            straightening and shortening the scope to obtain                            bowel loop reduction, using scope torsion, applying                            abdominal pressure and lavage. The patient                            tolerated the procedure well. The quality of the                            bowel preparation was fair. The ileocecal valve,  appendiceal orifice, and rectum were photographed. Scope In: 9:04:49 AM Scope Out: 9:37:16 AM Scope Withdrawal Time: 0 hours 22 minutes 51 seconds  Total Procedure Duration: 0 hours 32 minutes 27 seconds  Findings:      The perianal and digital rectal examinations were normal.      A 12 mm polyp was found in the ascending colon. The polyp was sessile.       The polyp was removed with a hot snare. Resection and retrieval were       complete. Estimated blood loss: none.      A 5 mm polyp was found in the transverse colon. The polyp was sessile.       The polyp was removed with a hot snare. Resection and retrieval were       complete. Estimated blood loss: none.      A 2 mm polyp was found in the descending colon. The polyp was       semi-sessile. The polyp was removed with a cold biopsy forceps.       Resection and retrieval were complete.  Estimated blood loss was minimal.      A 7 mm polyp was found in the sigmoid colon. The polyp was semi-sessile.       The polyp was removed with a hot snare. Resection and retrieval were       complete. Estimated blood loss: none.      Internal hemorrhoids were found during retroflexion. The hemorrhoids       were medium-sized and Grade I (internal hemorrhoids that do not       prolapse).      Extensive amounts of semi-liquid semi-solid stool was found in the       recto-sigmoid colon and in the sigmoid colon, interfering with       visualization. Lavage of the area was performed, resulting in clearance       with fair visualization. Impression:               - Preparation of the colon was fair.                           - One 12 mm polyp in the ascending colon, removed                            with a hot snare. Resected and retrieved.                           - One 5 mm polyp in the transverse colon, removed                            with a hot snare. Resected and retrieved.                           - One 2 mm polyp in the descending colon, removed                            with a cold biopsy forceps. Resected and retrieved.                           - One 7 mm polyp in the  sigmoid colon, removed with                            a hot snare. Resected and retrieved.                           - Internal hemorrhoids.                           - Stool in the recto-sigmoid colon and in the                            sigmoid colon. Moderate Sedation:      Not Applicable - Patient had care per Anesthesia. Recommendation:           - Patient has a contact number available for                            emergencies. The signs and symptoms of potential                            delayed complications were discussed with the                            patient. Return to normal activities tomorrow.                            Written discharge instructions were provided to the                             patient.                           - High fiber diet.                           - Await pathology results.                           - Repeat colonoscopy for surveillance based on                            pathology results.                           - No ibuprofen, naproxen, or other non-steroidal                            anti-inflammatory drugs for 2 weeks. Procedure Code(s):        --- Professional ---                           [phone], Colonoscopy, flexible; with removal of                            tumor(s), polyp(s), or other lesion(s) by snare  technique                           45380, 59, Colonoscopy, flexible; with biopsy,                            single or multiple Diagnosis Code(s):        --- Professional ---                           R19.5, Other fecal abnormalities                           D50.9, Iron deficiency anemia, unspecified                           K63.5, Polyp of colon                           K64.0, First degree hemorrhoids CPT copyright 2019 American Medical Association. All rights reserved. The codes documented in this report are preliminary and upon coder review may  be revised to meet current compliance requirements. Lear Ng, MD 09/29/2019 10:00:58 AM This report has been signed electronically. Number of Addenda: 0

## 2019-09-29 NOTE — Anesthesia Postprocedure Evaluation (Signed)
Anesthesia Post Note  Patient: Donna Becker  Procedure(s) Performed: COLONOSCOPY WITH PROPOFOL (N/A ) ESOPHAGOGASTRODUODENOSCOPY (EGD) WITH PROPOFOL (N/A ) BIOPSY POLYPECTOMY     Patient location during evaluation: PACU Anesthesia Type: MAC Level of consciousness: awake and alert Pain management: pain level controlled Vital Signs Assessment: post-procedure vital signs reviewed and stable Respiratory status: spontaneous breathing, nonlabored ventilation, respiratory function stable and patient connected to nasal cannula oxygen Cardiovascular status: stable and blood pressure returned to baseline Postop Assessment: no apparent nausea or vomiting Anesthetic complications: no   No complications documented.  Last Vitals:  Vitals:   09/29/19 1000 09/29/19 1010  BP: (!) 138/43 (!) 155/46  Pulse: (!) 52 (!) 52  Resp: 18 19  Temp:    SpO2: 97% 99%    Last Pain:  Vitals:   09/29/19 1010  TempSrc:   PainSc: 0-No pain                 Effie Berkshire

## 2019-09-29 NOTE — Discharge Instructions (Signed)

## 2019-09-29 NOTE — Op Note (Signed)
[facility] Patient Name: Nikka Hakimian Procedure Date: 09/29/2019 MRN: [id number] Attending MD: Lear Ng , MD Date of Birth: 1946/01/21 CSN: [id number] Age: 74 Admit Type: Outpatient Procedure:                Upper GI endoscopy Indications:              Iron deficiency anemia, Heme positive stool Providers:                Lear Ng, MD, Jeanella Cara, RN,                            Laverda Sorenson, Technician, Courtney Heys. [place], CRNA Referring MD:             Seward Carol Medicines:                Propofol per Anesthesia, Monitored Anesthesia Care Complications:            No immediate complications. Estimated Blood Loss:     Estimated blood loss was minimal. Procedure:                Pre-Anesthesia Assessment:                           - Prior to the procedure, a History and Physical                            was performed, and patient medications and                            allergies were reviewed. The patient's tolerance of                            previous anesthesia was also reviewed. The risks                            and benefits of the procedure and the sedation                            options and risks were discussed with the patient.                            All questions were answered, and informed consent                            was obtained. Prior Anticoagulants: The patient has                            taken no previous anticoagulant or antiplatelet                            agents. ASA Grade Assessment: III - A patient with                            severe systemic disease. After reviewing the risks  and benefits, the patient was deemed in                            satisfactory condition to undergo the procedure.                           After obtaining informed consent, the endoscope was                            passed under direct vision. Throughout the                             procedure, the patient's blood pressure, pulse, and                            oxygen saturations were monitored continuously. The                            GIF-H190 ([id number]) Olympus gastroscope was                            introduced through the mouth, and advanced to the                            second part of duodenum. The upper GI endoscopy was                            accomplished without difficulty. The patient                            tolerated the procedure well. Scope In: Scope Out: Findings:      The examined esophagus was normal.      The Z-line was regular and was found 40 cm from the incisors.      Segmental mild inflammation characterized by congestion (edema) and       erythema was found in the prepyloric region of the stomach.      A medium-sized hiatal hernia was present.      The examined duodenum was normal. Biopsies were taken with a cold       forceps for histology. Estimated blood loss was minimal. Impression:               - Normal esophagus.                           - Z-line regular, 40 cm from the incisors.                           - Acute gastritis.                           - Medium-sized hiatal hernia.                           - Normal examined duodenum. Biopsied. Moderate Sedation:      Not Applicable - Patient had care per Anesthesia. Recommendation:           -  Await pathology results.                           - Patient has a contact number available for                            emergencies. The signs and symptoms of potential                            delayed complications were discussed with the                            patient. Return to normal activities tomorrow.                            Written discharge instructions were provided to the                            patient.                           - Resume previous diet. Procedure Code(s):        --- Professional ---                           [phone],  Esophagogastroduodenoscopy, flexible,                            transoral; with biopsy, single or multiple Diagnosis Code(s):        --- Professional ---                           D50.9, Iron deficiency anemia, unspecified                           R19.5, Other fecal abnormalities                           K29.00, Acute gastritis without bleeding                           K44.9, Diaphragmatic hernia without obstruction or                            gangrene CPT copyright 2019 American Medical Association. All rights reserved. The codes documented in this report are preliminary and upon coder review may  be revised to meet current compliance requirements. Lear Ng, MD 09/29/2019 9:52:15 AM This report has been signed electronically. Number of Addenda: 0

## 2019-09-29 NOTE — Transfer of Care (Signed)
Immediate Anesthesia Transfer of Care Note  Patient: Donna Becker  Procedure(s) Performed: COLONOSCOPY WITH PROPOFOL (N/A ) ESOPHAGOGASTRODUODENOSCOPY (EGD) WITH PROPOFOL (N/A ) BIOPSY POLYPECTOMY  Patient Location: PACU and Endoscopy Unit  Anesthesia Type:MAC  Level of Consciousness: awake, alert  and oriented  Airway & Oxygen Therapy: Patient Spontanous Breathing and Patient connected to face mask oxygen  Post-op Assessment: Report given to RN, Post -op Vital signs reviewed and stable and Patient moving all extremities  Post vital signs: Reviewed and stable  Last Vitals:  Vitals Value Taken Time  BP 116/34 09/29/19 0944  Temp    Pulse 55 09/29/19 0947  Resp 22 09/29/19 0947  SpO2 100 % 09/29/19 0947  Vitals shown include unvalidated device data.  Last Pain:  Vitals:   09/29/19 0755  TempSrc: Oral  PainSc: 0-No pain         Complications: No complications documented.

## 2019-09-30 LAB — SURGICAL PATHOLOGY

## 2019-10-01 ENCOUNTER — Encounter (HOSPITAL_COMMUNITY): Payer: Self-pay | Admitting: Gastroenterology

## 2019-10-21 DIAGNOSIS — E1329 Other specified diabetes mellitus with other diabetic kidney complication: Secondary | ICD-10-CM | POA: Diagnosis not present

## 2019-10-21 DIAGNOSIS — E1129 Type 2 diabetes mellitus with other diabetic kidney complication: Secondary | ICD-10-CM | POA: Diagnosis not present

## 2019-10-21 DIAGNOSIS — E782 Mixed hyperlipidemia: Secondary | ICD-10-CM | POA: Diagnosis not present

## 2019-10-21 DIAGNOSIS — E7800 Pure hypercholesterolemia, unspecified: Secondary | ICD-10-CM | POA: Diagnosis not present

## 2019-10-21 DIAGNOSIS — E1169 Type 2 diabetes mellitus with other specified complication: Secondary | ICD-10-CM | POA: Diagnosis not present

## 2019-10-21 DIAGNOSIS — C50919 Malignant neoplasm of unspecified site of unspecified female breast: Secondary | ICD-10-CM | POA: Diagnosis not present

## 2019-10-21 DIAGNOSIS — E139 Other specified diabetes mellitus without complications: Secondary | ICD-10-CM | POA: Diagnosis not present

## 2019-10-21 DIAGNOSIS — Z853 Personal history of malignant neoplasm of breast: Secondary | ICD-10-CM | POA: Diagnosis not present

## 2019-10-21 DIAGNOSIS — I10 Essential (primary) hypertension: Secondary | ICD-10-CM | POA: Diagnosis not present

## 2019-10-28 ENCOUNTER — Ambulatory Visit: Payer: Medicare PPO | Admitting: Neurology

## 2019-10-28 ENCOUNTER — Other Ambulatory Visit: Payer: Medicare PPO

## 2019-10-28 ENCOUNTER — Encounter: Payer: Self-pay | Admitting: Neurology

## 2019-10-28 ENCOUNTER — Other Ambulatory Visit: Payer: Self-pay

## 2019-10-28 VITALS — BP 122/68 | HR 66 | Ht 65.0 in | Wt 179.0 lb

## 2019-10-28 DIAGNOSIS — G802 Spastic hemiplegic cerebral palsy: Secondary | ICD-10-CM | POA: Diagnosis not present

## 2019-10-28 DIAGNOSIS — G40009 Localization-related (focal) (partial) idiopathic epilepsy and epileptic syndromes with seizures of localized onset, not intractable, without status epilepticus: Secondary | ICD-10-CM | POA: Diagnosis not present

## 2019-10-28 DIAGNOSIS — G629 Polyneuropathy, unspecified: Secondary | ICD-10-CM | POA: Diagnosis not present

## 2019-10-28 DIAGNOSIS — Z79899 Other long term (current) drug therapy: Secondary | ICD-10-CM | POA: Diagnosis not present

## 2019-10-28 DIAGNOSIS — M79605 Pain in left leg: Secondary | ICD-10-CM | POA: Diagnosis not present

## 2019-10-28 NOTE — Patient Instructions (Signed)
1. Schedule EMG/NCV of the left lower extremity  2. Bloodwork for vitamin D level  3. Schedule bone density scan  4. Please check your medications if you are taking GABAPENTIN, and please call our office to update [place]  5. Continue all your other medications. Use walker/cane at all times, continue home PT exercises. If dizziness, worsens, please call our office and we will send referral for Vestibular therapy to put the tiny crystals back in your inner ear  6. Follow-up in 6 months, call for any changes

## 2019-10-28 NOTE — Progress Notes (Signed)
NEUROLOGY CONSULTATION NOTE  Donna Becker MRN: [id number] DOB: 13-Aug-1945  Referring provider: Dr. Seward Carol Primary care provider: Dr. Seward Carol  Reason for consult:  seizure  Dear Dr Delfina Redwood:  Thank you for your kind referral of Donna Becker for consultation of the above symptoms. Although her history is well known to you, please allow me to reiterate it for the purpose of our medical record. She is alone in the office today. Records and images were personally reviewed where available.  HISTORY OF PRESENT ILLNESS: This is a 74 year old left-handed woman with a history of hypertension, hyperlipidemia, diabetes, breast cancer with left arm lymphedema, cerebral palsy, seizures, presenting for evaluation of seizures, dizziness. Records from [place] were reviewed, her last visit with epileptologist Dr. Opal Sidles was in 2011. She was born with right-sided cerebral palsy and started having seizures as a teenager. 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 She also had episodes of staring/confusion lasting up to 15-20 minutes. She was previously on phenobarbital and nitrofurantoin, then has been on Tegretol for many years. In March 2011, she had an episode of confusion and odd behavior followed by prolonged period of sleepiness. MRI/MRA brain no acute changes. She did not tolerate increasing dose of Tegretol, at that time Levetiracetam 500mg  BID was added. On her visit in 2011, she reported frequent falls, due to left knee pain, she would fall because her right leg cannot support her. She states the doctor at [facility] gave her a white pill and the pain in her left leg was better. She reports the vessels in her left leg pops up, she has compression stockings which are uncomfortable. She is not taking the unrecalled medication since she moved to [place] more than 10 years ago. Gabapentin is on her medication list on EPIC, however she does not think she is taking  it. It is not on the medication list from Dr. Lina Sar last office visit note. She denies any seizures for many, many years. She manages her own medications and states she takes them religiously. She lives with her brother who has not mentioned any staring/unresponsive episodes. She denies any gaps in time, olfactory/gustatory hallucinations, myoclonic jerks. She takes carbamazepine 200mg  1 tab in AM, 2 tabs in PM without side effects.  In April, her sister called Dr. Delfina Redwood to report sudden onset fainting episodes at home. She fainted and fell suddenly while walking at home, then had another event 2 weeks later. She is alone in the office today and denies any episodes of loss of consciousness. Notes from Dr. Delfina Redwood were reviewed, she was reporting feeling weak, dizzy, like the room is dizzy. Symptoms typically occur when changing position. Orthostatic vital signs were negative at her visit. Head CT was ordered but she did not do the test. She states the dizziness is not as much as it was. She has to be very careful with how she moves, she would be in bed and try to move then start spinning. No nausea/vomiting, this does not occur daily. She has had falls but is unsure if they are associated with the dizziness. She reports frequent falls, last fall was last Monday, she had her cane when she fell. She has to be very careful, before she knows it, she is on the floor, no loss of consciousness. She reports her left leg is so painful. Due to her right sided CP, she has always depended on the left side, but now it is "not my side  anymore." She was previously taking aspirin for the pain but stopped since ineffective. She has left-sided back pain and neck pain down her left shoulder. No headaches, diplopia, dysarthria/dysphagia. She reports her last HbA1c was okay. Her last PT session was 2 years ago where she was given exercises for left leg pain, she continues to do them.   MRI lumbar spine in 2012 showed degenerative  anterolisthesis and uncovering of the disc at L4-5 and L5-S1; Right greater than left lateral recess narrowing at L4-5; Mild lateral recess narrowing bilaterally at L5-S1; Prominent epidural fat at L4-5 and L5-S1 compatible with epidural lipomatosis; Mild facet hypertrophy L3-4 without significant stenosis.   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 There is no history of febrile convulsions, CNS infections such as meningitis/encephalitis, significant traumatic brain injury, neurosurgical procedures, or family history of seizures.  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 No acute change.    PAST MEDICAL HISTORY: Past Medical History:  Diagnosis Date  . Arthritis   . Cancer [facility])    left breast cancer /with retained lymph edema left arm  . Cerebral palsy, hemiplegic (HCC)    right sided weakness- ambulates with walker  . Diabetes mellitus without complication ([place])   . Glaucoma   . Gout   . History of shingles   . Hypertension   . Neuromuscular disorder (HCC)    neuropathy, right sided paresis, left arm numbness  . Seizure ([place])   . Seizures ([place])   . Transfusion history    '90's when had breast cancer surgery    PAST SURGICAL HISTORY: Past Surgical History:  Procedure Laterality Date  . BIOPSY  09/29/2019   Procedure: BIOPSY;  Surgeon: Wilford Corner, MD;  Location: [facility];  Service: Endoscopy;;  . BREAST SURGERY Left    s/p left mastectomy  . CHOLECYSTECTOMY     open   . COLONOSCOPY WITH PROPOFOL N/A 09/29/2019   Procedure: COLONOSCOPY WITH PROPOFOL;  Surgeon: Wilford Corner, MD;  Location: [facility];  Service: Endoscopy;  Laterality: N/A;  . ESOPHAGOGASTRODUODENOSCOPY (EGD) WITH PROPOFOL N/A 09/29/2019   Procedure:  ESOPHAGOGASTRODUODENOSCOPY (EGD) WITH PROPOFOL;  Surgeon: Wilford Corner, MD;  Location: [facility];  Service: Endoscopy;  Laterality: N/A;  . EYE SURGERY Right    corneal transplant  . FLEXIBLE SIGMOIDOSCOPY N/A 01/26/2013   Procedure: FLEXIBLE SIGMOIDOSCOPY;  Surgeon: Garlan Fair, MD;  Location: [facility];  Service: Endoscopy;  Laterality: N/A;  no sedation  . POLYPECTOMY  09/29/2019   Procedure: POLYPECTOMY;  Surgeon: Wilford Corner, MD;  Location: [facility];  Service: Endoscopy;;  . port-a-cath     implantation and removal  . TUBAL LIGATION      MEDICATIONS: Current Outpatient Medications on File Prior to Visit  Medication Sig Dispense Refill  . allopurinol (ZYLOPRIM) 100 MG tablet Take 100 mg by mouth daily.    . B Complex-C (B-COMPLEX WITH VITAMIN C) tablet Take 1 tablet by mouth daily.    . bimatoprost (LUMIGAN) 0.01 % SOLN Place 1 drop into both eyes at bedtime.    . carbamazepine (TEGRETOL) 200 MG tablet Take 200 mg by mouth 3 (three) times daily.    . colchicine 0.6 MG tablet Take 0.6 mg by mouth daily.    . cycloSPORINE, PF, (CEQUA) 0.09 % SOLN Place 1 drop into both eyes every  morning.    . diphenhydrAMINE (BENADRYL) 25 mg capsule Take 25 mg by mouth every 6 (six) hours as needed for itching or allergies.    . furosemide (LASIX) 20 MG tablet Take 20 mg by mouth every morning.    . gabapentin (NEURONTIN) 600 MG tablet Take 600 mg by mouth 3 (three) times daily.    Marland Kitchen glipiZIDE (GLUCOTROL XL) 10 MG 24 hr tablet Take 10 mg by mouth daily.    . hydrALAZINE (APRESOLINE) 100 MG tablet Take 100 mg by mouth 2 (two) times daily.    Marland Kitchen losartan (COZAAR) 100 MG tablet Take 100 mg by mouth every evening.    . metFORMIN (GLUCOPHAGE) 500 MG tablet Take 500 mg by mouth 2 (two) times daily with a meal.    . metoprolol (LOPRESSOR) 100 MG tablet Take 100 mg by mouth 2 (two) times daily.    . Multiple Vitamin (MULTIVITAMIN WITH MINERALS) TABS tablet Take 1 tablet by mouth daily.     . pravastatin (PRAVACHOL) 40 MG tablet Take 40 mg by mouth every evening.    . Probiotic Product (PROBIOTIC DAILY) CAPS Take 1 capsule by mouth daily.    Marland Kitchen telmisartan (MICARDIS) 80 MG tablet Take 80 mg by mouth every morning.     No current facility-administered medications on file prior to visit.    ALLERGIES: Allergies  Allergen Reactions  . Codeine Itching and Rash    FAMILY HISTORY: Family History  Problem Relation Age of Onset  . Hypertension Mother   . Arthritis Sister     SOCIAL HISTORY: Social History   Socioeconomic History  . Marital status: Single    Spouse name: Not on file  . Number of children: Not on file  . Years of education: Not on file  . Highest education level: Not on file  Occupational History  . Occupation: retired Licensed conveyancer  Tobacco Use  . Smoking status: Former Smoker    Years: 1.00    Quit date: 01/06/1969    Years since quitting: 50.8  . Smokeless tobacco: Never Used  Substance and Sexual Activity  . Alcohol use: No  . Drug use: No  . Sexual activity: Not Currently  Other Topics Concern  . Not on file  Social History Narrative  . Not on file   Social Determinants of Health   Financial Resource Strain:   . Difficulty of Paying Living Expenses:   Food Insecurity:   . Worried About Charity fundraiser in the Last Year:   . Arboriculturist in the Last Year:   Transportation Needs:   . Film/video editor (Medical):   Marland Kitchen Lack of Transportation (Non-Medical):   Physical Activity:   . Days of Exercise per Week:   . Minutes of Exercise per Session:   Stress:   . Feeling of Stress :   Social Connections:   . Frequency of Communication with Friends and Family:   . Frequency of Social Gatherings with Friends and Family:   . Attends Religious Services:   . Active Member of Clubs or Organizations:   . Attends Archivist Meetings:   Marland Kitchen Marital Status:   Intimate Partner Violence:   . Fear of Current or Ex-Partner:   .  Emotionally Abused:   Marland Kitchen Physically Abused:   . Sexually Abused:     PHYSICAL EXAM: Vitals:   10/28/19 1320  BP: 122/68  Pulse: 66  SpO2: 97%   General: No acute distress Head:  Normocephalic/atraumatic Skin/Extremities:  No rash, no edema Neurological Exam: Mental status: alert and oriented to person, place, and time, no dysarthria or aphasia, Fund of knowledge is appropriate.  Recent and remote memory are intact.  Attention and concentration are normal.    Cranial nerves: CN I: not tested CN II: pupils equal, round and reactive to light, visual fields intact CN III, IV, VI:  full range of motion, no nystagmus, no ptosis CN V: facial sensation intact CN VII: upper and lower face symmetric CN VIII: hearing intact to conversation Bulk & Tone: spasticity on right UE and LE, no fasciculations. Motor: 5/5 on left UE and LE reporting difficulty with left shoulder abduction due to pain, right proximal UE 5/5, distal 3/5 with difficulty extending 4th and 5th digits, right LE 4/5 proximal, 5/5 distally Sensation: intact to light touch, cold, pin on both UE, decreased vibration sense to knees bilaterally. Romberg test negative Deep Tendon Reflexes: brisk +3 on right UE and LE Cerebellar: no incoordination on finger to nose testing Gait: spastic hemiparetic gait on right Tremor: none   IMPRESSION: This is a 74 year old left-handed woman with a history of hypertension, hyperlipidemia, diabetes, breast cancer with left arm lymphedema, cerebral palsy, seizures, presenting for evaluation of seizures, dizziness. She denies any seizures in many years. Her sister had called Dr. Lina Sar office in April reporting syncopal episodes, she is alone in the office today and currently denies any episodes of loss of consciousness. She states she was having dizziness, but this has improved as well. History suggestive of BPPV, if symptoms worsen, she will be referred for Vestibular therapy. Her neurological exam  shows chronic changes from CP on the right side, mild neuropathy, which can affect balance. She declined balance therapy and will continue her home PT exercises. Her main complaint today is left leg pain. She will be scheduled for an EMG/NCV of the left lower extremity. She was previously on gabapentin, unclear if still taking or why it was stopped, may help with pain. We discussed long-term carbamazepine use, check vitamin D level, bone density scan every 3 years. She does not drive. Follow-up in 6 months, she knows to call for any changes.   Thank you for allowing me to participate in the care of this patient. Please do not hesitate to call for any questions or concerns.   Ellouise Newer, M.D.  CC: Dr. Delfina Redwood

## 2019-10-29 ENCOUNTER — Ambulatory Visit: Payer: Medicare PPO | Admitting: Neurology

## 2019-10-29 ENCOUNTER — Telehealth: Payer: Self-pay | Admitting: Neurology

## 2019-10-29 LAB — VITAMIN D 25 HYDROXY (VIT D DEFICIENCY, FRACTURES): VITD: 43.29 ng/mL (ref 30.00–100.00)

## 2019-10-29 NOTE — Telephone Encounter (Signed)
No answer when called 

## 2019-10-29 NOTE — Telephone Encounter (Signed)
No answer

## 2019-10-29 NOTE — Telephone Encounter (Signed)
Access[employer]urse 10/28/19 @ 5:25PM:  "Caller states she was just calling back to notify the doctor that she doesn't take a certain medication, didn't have the name of it."

## 2019-10-29 NOTE — Telephone Encounter (Signed)
Spoke with pt who states she is no longer taking gabapentin and she was supposed to let Dr Delice Lesch know this.

## 2019-11-01 ENCOUNTER — Telehealth: Payer: Self-pay

## 2019-11-01 NOTE — Telephone Encounter (Signed)
Called patient and informed her of lab results. Patient had no questions or concerns.

## 2019-11-01 NOTE — Telephone Encounter (Signed)
-----   Message from Cameron Sprang, MD sent at 11/01/2019  8:36 AM EDT ----- Pls let her know vitamin D level looks good, thanks

## 2019-11-02 DIAGNOSIS — C50919 Malignant neoplasm of unspecified site of unspecified female breast: Secondary | ICD-10-CM | POA: Diagnosis not present

## 2019-11-02 DIAGNOSIS — E119 Type 2 diabetes mellitus without complications: Secondary | ICD-10-CM | POA: Diagnosis not present

## 2019-11-02 DIAGNOSIS — E782 Mixed hyperlipidemia: Secondary | ICD-10-CM | POA: Diagnosis not present

## 2019-11-02 DIAGNOSIS — D509 Iron deficiency anemia, unspecified: Secondary | ICD-10-CM | POA: Diagnosis not present

## 2019-11-02 DIAGNOSIS — E7800 Pure hypercholesterolemia, unspecified: Secondary | ICD-10-CM | POA: Diagnosis not present

## 2019-11-02 DIAGNOSIS — I10 Essential (primary) hypertension: Secondary | ICD-10-CM | POA: Diagnosis not present

## 2019-11-02 DIAGNOSIS — E1129 Type 2 diabetes mellitus with other diabetic kidney complication: Secondary | ICD-10-CM | POA: Diagnosis not present

## 2019-11-02 DIAGNOSIS — E1169 Type 2 diabetes mellitus with other specified complication: Secondary | ICD-10-CM | POA: Diagnosis not present

## 2019-11-02 DIAGNOSIS — G8929 Other chronic pain: Secondary | ICD-10-CM | POA: Diagnosis not present

## 2019-11-30 ENCOUNTER — Ambulatory Visit (INDEPENDENT_AMBULATORY_CARE_PROVIDER_SITE_OTHER): Payer: Medicare PPO | Admitting: Neurology

## 2019-11-30 ENCOUNTER — Other Ambulatory Visit: Payer: Self-pay

## 2019-11-30 DIAGNOSIS — G40009 Localization-related (focal) (partial) idiopathic epilepsy and epileptic syndromes with seizures of localized onset, not intractable, without status epilepticus: Secondary | ICD-10-CM

## 2019-11-30 DIAGNOSIS — G802 Spastic hemiplegic cerebral palsy: Secondary | ICD-10-CM

## 2019-11-30 DIAGNOSIS — M79605 Pain in left leg: Secondary | ICD-10-CM

## 2019-11-30 DIAGNOSIS — Z79899 Other long term (current) drug therapy: Secondary | ICD-10-CM

## 2019-11-30 DIAGNOSIS — G629 Polyneuropathy, unspecified: Secondary | ICD-10-CM

## 2019-11-30 NOTE — Procedures (Signed)
[facility]  [address], [place]  [place], [place] [address] Tel: [phone] Fax:  [phone] Test Date:  11/30/2019  Patient: Donna Becker DOB: Dec 09, 1945 Physician: Narda Amber, DO  Sex: Female Height: 5\' 5"  Ref Phys: Ellouise Newer, M.D.  ID#: [id number] Temp: 33.0C Technician:    Patient Complaints: This is a 74 year old female with cerebral palsy, history of breast cancer referred for evaluation of left leg pain and numbness.  NCV & EMG Findings: Extensive electrodiagnostic testing of the left lower extremity and additional studies of the right shows:  1. Bilateral sural and superficial peroneal sensory responses are absent. 2. Left peroneal (EDB) and tibial motor responses are absent.  Left peroneal motor response at the tibialis anterior is borderline normal. 3. Chronic motor axonal loss changes are seen affecting the L3-L5 myotomes, without accompanying active denervation.  Proximal muscles were unable to be tested due to body physiognomy and difficulty with positioning.  Impression: 1. The electrophysiologic findings are consistent with a chronic sensorimotor axonal polyneuropathy affecting the lower extremities. 2. A superimposed lumbosacral radiculopathy affecting L3-5 nerve root/segments cannot be excluded.  Correlate clinically.   ___________________________ Narda Amber, DO    Nerve Conduction Studies Anti Sensory Summary Table   Stim Site NR Peak (ms) Norm Peak (ms) P-T Amp (V) Norm P-T Amp  Left Sup Peroneal Anti Sensory (Ant Lat Mall)  33C  12 cm NR  <4.6  >3  Right Sup Peroneal Anti Sensory (Ant Lat Mall)  33C  12 cm NR  <4.6  >3  Left Sural Anti Sensory (Lat Mall)  33C  Calf NR  <4.6  >3  Right Sural Anti Sensory (Lat Mall)  33C  Calf NR  <4.6  >3   Motor Summary Table   Stim Site NR Onset (ms) Norm Onset (ms) O-P Amp (mV) Norm O-P Amp Site1 Site2 Delta-0 (ms) Dist (cm) Vel (m/s) Norm Vel (m/s)  Left Peroneal Motor (Ext Dig  Brev)  33C  Ankle NR  <6.0  >2.5 B Fib Ankle  0.0  >40  B Fib NR     Poplt B Fib  0.0  >40  Poplt NR            Left Peroneal TA Motor (Tib Ant)  33C  Fib Head    4.5 <4.5 3.0 >3 Poplit Fib Head 1.0 5.0 50 >40  Poplit    5.5  2.9         Left Tibial Motor (Abd Hall Brev)  33C  Ankle NR  <6.0  >4 Knee Ankle  0.0  >40  Knee NR             EMG   Side Muscle Ins Act Fibs Psw Fasc Number Recrt Dur Dur. Amp Amp. Poly Poly. Comment  Left AntTibialis Nml Nml Nml Nml 2- Rapid Many 1+ Many 1+ Many 1+ N/A  Left Gastroc Nml Nml Nml Nml Nml Nml Nml Nml Nml Nml Nml Nml N/A  Left Flex Dig Long Nml Nml Nml Nml 2- Rapid Many 1+ Many 1+ Many 1+ N/A  Left RectFemoris Nml Nml Nml Nml 1- Rapid Some 1+ Some 1+ Nml Nml N/A  Left AdductorLong Nml Nml Nml Nml 1- Rapid Some 1+ Some 1+ Nml Nml N/A      Waveforms:

## 2019-12-02 DIAGNOSIS — I10 Essential (primary) hypertension: Secondary | ICD-10-CM | POA: Diagnosis not present

## 2019-12-02 DIAGNOSIS — E1169 Type 2 diabetes mellitus with other specified complication: Secondary | ICD-10-CM | POA: Diagnosis not present

## 2019-12-02 DIAGNOSIS — E782 Mixed hyperlipidemia: Secondary | ICD-10-CM | POA: Diagnosis not present

## 2019-12-02 DIAGNOSIS — E7800 Pure hypercholesterolemia, unspecified: Secondary | ICD-10-CM | POA: Diagnosis not present

## 2019-12-02 DIAGNOSIS — C50919 Malignant neoplasm of unspecified site of unspecified female breast: Secondary | ICD-10-CM | POA: Diagnosis not present

## 2019-12-02 DIAGNOSIS — G8929 Other chronic pain: Secondary | ICD-10-CM | POA: Diagnosis not present

## 2019-12-02 DIAGNOSIS — Z853 Personal history of malignant neoplasm of breast: Secondary | ICD-10-CM | POA: Diagnosis not present

## 2019-12-02 DIAGNOSIS — E1129 Type 2 diabetes mellitus with other diabetic kidney complication: Secondary | ICD-10-CM | POA: Diagnosis not present

## 2019-12-02 DIAGNOSIS — E119 Type 2 diabetes mellitus without complications: Secondary | ICD-10-CM | POA: Diagnosis not present

## 2019-12-03 DIAGNOSIS — H401133 Primary open-angle glaucoma, bilateral, severe stage: Secondary | ICD-10-CM | POA: Diagnosis not present

## 2019-12-23 DIAGNOSIS — H43811 Vitreous degeneration, right eye: Secondary | ICD-10-CM | POA: Diagnosis not present

## 2019-12-23 DIAGNOSIS — H16223 Keratoconjunctivitis sicca, not specified as Sjogren's, bilateral: Secondary | ICD-10-CM | POA: Diagnosis not present

## 2019-12-23 DIAGNOSIS — H401133 Primary open-angle glaucoma, bilateral, severe stage: Secondary | ICD-10-CM | POA: Diagnosis not present

## 2020-01-21 DIAGNOSIS — Z853 Personal history of malignant neoplasm of breast: Secondary | ICD-10-CM | POA: Diagnosis not present

## 2020-01-21 DIAGNOSIS — E782 Mixed hyperlipidemia: Secondary | ICD-10-CM | POA: Diagnosis not present

## 2020-01-21 DIAGNOSIS — I10 Essential (primary) hypertension: Secondary | ICD-10-CM | POA: Diagnosis not present

## 2020-01-21 DIAGNOSIS — E1169 Type 2 diabetes mellitus with other specified complication: Secondary | ICD-10-CM | POA: Diagnosis not present

## 2020-01-21 DIAGNOSIS — G8929 Other chronic pain: Secondary | ICD-10-CM | POA: Diagnosis not present

## 2020-01-21 DIAGNOSIS — E1129 Type 2 diabetes mellitus with other diabetic kidney complication: Secondary | ICD-10-CM | POA: Diagnosis not present

## 2020-01-21 DIAGNOSIS — C50919 Malignant neoplasm of unspecified site of unspecified female breast: Secondary | ICD-10-CM | POA: Diagnosis not present

## 2020-01-21 DIAGNOSIS — E139 Other specified diabetes mellitus without complications: Secondary | ICD-10-CM | POA: Diagnosis not present

## 2020-01-21 DIAGNOSIS — E1329 Other specified diabetes mellitus with other diabetic kidney complication: Secondary | ICD-10-CM | POA: Diagnosis not present

## 2020-02-07 DIAGNOSIS — E1129 Type 2 diabetes mellitus with other diabetic kidney complication: Secondary | ICD-10-CM | POA: Diagnosis not present

## 2020-02-07 DIAGNOSIS — E7800 Pure hypercholesterolemia, unspecified: Secondary | ICD-10-CM | POA: Diagnosis not present

## 2020-02-07 DIAGNOSIS — E1329 Other specified diabetes mellitus with other diabetic kidney complication: Secondary | ICD-10-CM | POA: Diagnosis not present

## 2020-02-07 DIAGNOSIS — E139 Other specified diabetes mellitus without complications: Secondary | ICD-10-CM | POA: Diagnosis not present

## 2020-02-07 DIAGNOSIS — E782 Mixed hyperlipidemia: Secondary | ICD-10-CM | POA: Diagnosis not present

## 2020-02-07 DIAGNOSIS — G8929 Other chronic pain: Secondary | ICD-10-CM | POA: Diagnosis not present

## 2020-02-07 DIAGNOSIS — I10 Essential (primary) hypertension: Secondary | ICD-10-CM | POA: Diagnosis not present

## 2020-02-07 DIAGNOSIS — Z853 Personal history of malignant neoplasm of breast: Secondary | ICD-10-CM | POA: Diagnosis not present

## 2020-02-07 DIAGNOSIS — C50919 Malignant neoplasm of unspecified site of unspecified female breast: Secondary | ICD-10-CM | POA: Diagnosis not present

## 2020-02-09 DIAGNOSIS — I10 Essential (primary) hypertension: Secondary | ICD-10-CM | POA: Diagnosis not present

## 2020-02-09 DIAGNOSIS — D509 Iron deficiency anemia, unspecified: Secondary | ICD-10-CM | POA: Diagnosis not present

## 2020-02-09 DIAGNOSIS — E1169 Type 2 diabetes mellitus with other specified complication: Secondary | ICD-10-CM | POA: Diagnosis not present

## 2020-02-09 DIAGNOSIS — I350 Nonrheumatic aortic (valve) stenosis: Secondary | ICD-10-CM | POA: Diagnosis not present

## 2020-02-09 DIAGNOSIS — E7800 Pure hypercholesterolemia, unspecified: Secondary | ICD-10-CM | POA: Diagnosis not present

## 2020-02-09 DIAGNOSIS — Z0000 Encounter for general adult medical examination without abnormal findings: Secondary | ICD-10-CM | POA: Diagnosis not present

## 2020-02-09 DIAGNOSIS — M109 Gout, unspecified: Secondary | ICD-10-CM | POA: Diagnosis not present

## 2020-02-09 DIAGNOSIS — Z1389 Encounter for screening for other disorder: Secondary | ICD-10-CM | POA: Diagnosis not present

## 2020-02-18 DIAGNOSIS — E119 Type 2 diabetes mellitus without complications: Secondary | ICD-10-CM | POA: Diagnosis not present

## 2020-03-22 DIAGNOSIS — E113293 Type 2 diabetes mellitus with mild nonproliferative diabetic retinopathy without macular edema, bilateral: Secondary | ICD-10-CM | POA: Diagnosis not present

## 2020-03-22 DIAGNOSIS — H401133 Primary open-angle glaucoma, bilateral, severe stage: Secondary | ICD-10-CM | POA: Diagnosis not present

## 2020-03-22 DIAGNOSIS — H2512 Age-related nuclear cataract, left eye: Secondary | ICD-10-CM | POA: Diagnosis not present

## 2020-03-22 DIAGNOSIS — H16223 Keratoconjunctivitis sicca, not specified as Sjogren's, bilateral: Secondary | ICD-10-CM | POA: Diagnosis not present

## 2020-03-23 DIAGNOSIS — C50919 Malignant neoplasm of unspecified site of unspecified female breast: Secondary | ICD-10-CM | POA: Diagnosis not present

## 2020-03-23 DIAGNOSIS — E782 Mixed hyperlipidemia: Secondary | ICD-10-CM | POA: Diagnosis not present

## 2020-03-23 DIAGNOSIS — E1129 Type 2 diabetes mellitus with other diabetic kidney complication: Secondary | ICD-10-CM | POA: Diagnosis not present

## 2020-03-23 DIAGNOSIS — I10 Essential (primary) hypertension: Secondary | ICD-10-CM | POA: Diagnosis not present

## 2020-03-23 DIAGNOSIS — E7800 Pure hypercholesterolemia, unspecified: Secondary | ICD-10-CM | POA: Diagnosis not present

## 2020-03-23 DIAGNOSIS — Z853 Personal history of malignant neoplasm of breast: Secondary | ICD-10-CM | POA: Diagnosis not present

## 2020-03-23 DIAGNOSIS — G8929 Other chronic pain: Secondary | ICD-10-CM | POA: Diagnosis not present

## 2020-03-23 DIAGNOSIS — E139 Other specified diabetes mellitus without complications: Secondary | ICD-10-CM | POA: Diagnosis not present

## 2020-03-23 DIAGNOSIS — E1329 Other specified diabetes mellitus with other diabetic kidney complication: Secondary | ICD-10-CM | POA: Diagnosis not present

## 2020-04-17 DIAGNOSIS — H25812 Combined forms of age-related cataract, left eye: Secondary | ICD-10-CM | POA: Diagnosis not present

## 2020-04-17 DIAGNOSIS — H2512 Age-related nuclear cataract, left eye: Secondary | ICD-10-CM | POA: Diagnosis not present

## 2020-04-17 DIAGNOSIS — H16223 Keratoconjunctivitis sicca, not specified as Sjogren's, bilateral: Secondary | ICD-10-CM | POA: Diagnosis not present

## 2020-04-17 DIAGNOSIS — H401133 Primary open-angle glaucoma, bilateral, severe stage: Secondary | ICD-10-CM | POA: Diagnosis not present

## 2020-04-17 DIAGNOSIS — E113293 Type 2 diabetes mellitus with mild nonproliferative diabetic retinopathy without macular edema, bilateral: Secondary | ICD-10-CM | POA: Diagnosis not present

## 2020-04-24 DIAGNOSIS — E1129 Type 2 diabetes mellitus with other diabetic kidney complication: Secondary | ICD-10-CM | POA: Diagnosis not present

## 2020-04-24 DIAGNOSIS — E1329 Other specified diabetes mellitus with other diabetic kidney complication: Secondary | ICD-10-CM | POA: Diagnosis not present

## 2020-04-24 DIAGNOSIS — E782 Mixed hyperlipidemia: Secondary | ICD-10-CM | POA: Diagnosis not present

## 2020-04-24 DIAGNOSIS — C50919 Malignant neoplasm of unspecified site of unspecified female breast: Secondary | ICD-10-CM | POA: Diagnosis not present

## 2020-04-24 DIAGNOSIS — Z853 Personal history of malignant neoplasm of breast: Secondary | ICD-10-CM | POA: Diagnosis not present

## 2020-04-24 DIAGNOSIS — G8929 Other chronic pain: Secondary | ICD-10-CM | POA: Diagnosis not present

## 2020-04-24 DIAGNOSIS — I10 Essential (primary) hypertension: Secondary | ICD-10-CM | POA: Diagnosis not present

## 2020-04-24 DIAGNOSIS — E1169 Type 2 diabetes mellitus with other specified complication: Secondary | ICD-10-CM | POA: Diagnosis not present

## 2020-04-24 DIAGNOSIS — E7800 Pure hypercholesterolemia, unspecified: Secondary | ICD-10-CM | POA: Diagnosis not present

## 2020-06-02 ENCOUNTER — Other Ambulatory Visit: Payer: Self-pay

## 2020-06-02 ENCOUNTER — Ambulatory Visit: Payer: Medicare PPO | Admitting: Neurology

## 2020-06-02 ENCOUNTER — Encounter: Payer: Self-pay | Admitting: Neurology

## 2020-06-02 VITALS — BP 204/68 | HR 58 | Resp 18 | Ht 65.0 in | Wt 150.0 lb

## 2020-06-02 DIAGNOSIS — G629 Polyneuropathy, unspecified: Secondary | ICD-10-CM | POA: Diagnosis not present

## 2020-06-02 DIAGNOSIS — R296 Repeated falls: Secondary | ICD-10-CM | POA: Diagnosis not present

## 2020-06-02 DIAGNOSIS — M5440 Lumbago with sciatica, unspecified side: Secondary | ICD-10-CM

## 2020-06-02 DIAGNOSIS — G40009 Localization-related (focal) (partial) idiopathic epilepsy and epileptic syndromes with seizures of localized onset, not intractable, without status epilepticus: Secondary | ICD-10-CM

## 2020-06-02 DIAGNOSIS — M79605 Pain in left leg: Secondary | ICD-10-CM | POA: Diagnosis not present

## 2020-06-02 MED ORDER — GABAPENTIN 300 MG PO CAPS: 300.0000 mg | ORAL_CAPSULE | Freq: Every day | ORAL | 11 refills | Status: DC

## 2020-06-02 NOTE — Progress Notes (Signed)
NEUROLOGY FOLLOW UP OFFICE NOTE  Donna Becker [id number] [birth year]  HISTORY OF PRESENT ILLNESS: I had the pleasure of seeing Donna Becker in follow-up in the neurology clinic on 06/02/2020.  The patient was last seen 7 months ago for seizures and dizziness. On her initial visit in 10/2019, she reported that dizziness had resolved and she denied any episodes of loss of consciousness, however she was alone with no family to corroborate history. Today she is accompanied by her sister Donna Becker who helps supplement the history.  Records and images were personally reviewed where available.  Her main concern on last visit was left leg pain. 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 Since her last visit, she and her sister deny any seizures or loss of consciousness. She lives with her brother. She continues to have frequent falls. She would be walking with her cane when all of a sudden her legs just give way. Last fall was at the beginning of the month. She has a fall every other month or so. She has back pain and left leg numbness/paresthesias. It feels like the floor is moving under her left foot. There is occasional burning sensation in her feet. She reports that after chemo at [facility], there was damage on her left side and she was started on gabapentin which helped, she has not been taking this since she moved to [place]. She is now off DM medications and reports glucose levels are good. She manages her own medications and denies missing doses. She has positional vertigo, she would be lying down and get dizzy when she turns to the left.    History on Initial Assessment 10/28/2019: This is a 75 year old left-handed woman with a history of hypertension, hyperlipidemia, diabetes, breast cancer with left arm lymphedema, cerebral palsy, seizures, presenting for  evaluation of seizures, dizziness. Records from [place] were reviewed, her last visit with epileptologist Dr. Opal Sidles was in 2011. She was born with right-sided cerebral palsy and started having seizures as a teenager. 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 She also had episodes of staring/confusion lasting up to 15-20 minutes. She was previously on phenobarbital and nitrofurantoin, then has been on Tegretol for many years. In March 2011, she had an episode of confusion and odd behavior followed by prolonged period of sleepiness. MRI/MRA brain no acute changes. She did not tolerate increasing dose of Tegretol, at that time Levetiracetam 500mg  BID was added. On her visit in 2011, she reported frequent falls, due to left knee pain, she would fall because her right leg cannot support her. She states the doctor at [facility] gave her a white pill and the pain in her left leg was better. She reports the vessels in her left leg pops up, she has compression stockings which are uncomfortable. She is not taking the unrecalled medication since she moved to [place] more than 10 years ago. Gabapentin is on her medication list on [facility], however she does not think she is taking it. It is not on the medication list from Dr. Lina Sar last office visit note. She denies any seizures for many, many years. She manages her own medications and states she takes them religiously. She lives with her brother who has not mentioned any staring/unresponsive episodes. She denies any gaps in time, olfactory/gustatory hallucinations, myoclonic jerks. She takes carbamazepine 200mg  1 tab in AM, 2  tabs in PM without side effects.  In April, her sister called Dr. Delfina Redwood to report sudden onset fainting episodes at home. She fainted and fell suddenly while walking at home, then had another event 2 weeks later. She is alone in the office today and denies any episodes of loss of consciousness. Notes from Dr. Delfina Redwood were  reviewed, she was reporting feeling weak, dizzy, like the room is dizzy. Symptoms typically occur when changing position. Orthostatic vital signs were negative at her visit. Head CT was ordered but she did not do the test. She states the dizziness is not as much as it was. She has to be very careful with how she moves, she would be in bed and try to move then start spinning. No nausea/vomiting, this does not occur daily. She has had falls but is unsure if they are associated with the dizziness. She reports frequent falls, last fall was last Monday, she had her cane when she fell. She has to be very careful, before she knows it, she is on the floor, no loss of consciousness. She reports her left leg is so painful. Due to her right sided CP, she has always depended on the left side, but now it is "not my side anymore." She was previously taking aspirin for the pain but stopped since ineffective. She has left-sided back pain and neck pain down her left shoulder. No headaches, diplopia, dysarthria/dysphagia. She reports her last HbA1c was okay. Her last PT session was 2 years ago where she was given exercises for left leg pain, she continues to do them.   MRI lumbar spine in 2012 showed degenerative anterolisthesis and uncovering of the disc at L4-5 and L5-S1; Right greater than left lateral recess narrowing at L4-5; Mild lateral recess narrowing bilaterally at L5-S1; Prominent epidural fat at L4-5 and L5-S1 compatible with epidural lipomatosis; Mild facet hypertrophy L3-4 without significant stenosis.   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 There is no history of febrile convulsions, CNS infections such as meningitis/encephalitis, significant traumatic brain injury, neurosurgical procedures, or family history of seizures.  Diagnostic Data: EEGs: none available for review MRI: MRI Brain without contrast in 2012 showed left posterior MCA chronic infarct  with encephalomalacia, mild ex vacuo enlargement of left lateral ventricle, mild gliosis. No acute change.    PAST MEDICAL HISTORY: Past Medical History:  Diagnosis Date  . Arthritis   . Cancer [facility])    left breast cancer /with retained lymph edema left arm  . Cerebral palsy, hemiplegic (HCC)    right sided weakness- ambulates with walker  . Diabetes mellitus without complication ([place])   . Glaucoma   . Gout   . History of shingles   . Hypertension   . Neuromuscular disorder (HCC)    neuropathy, right sided paresis, left arm numbness  . Seizure ([place])   . Seizures ([place])   . Transfusion history    '90's when had breast cancer surgery    MEDICATIONS: Current Outpatient Medications on File Prior to Visit  Medication Sig Dispense Refill  . allopurinol (ZYLOPRIM) 100 MG tablet Take 100 mg by mouth daily.    . B Complex-C (B-COMPLEX WITH VITAMIN C) tablet Take 1 tablet by mouth daily.    . bimatoprost (LUMIGAN) 0.01 % SOLN Place 1 drop into both eyes at bedtime.    . carbamazepine (TEGRETOL) 200 MG tablet Take 200 mg by mouth 3 (three) times daily.    . colchicine 0.6 MG  tablet Take 0.6 mg by mouth daily.    . cycloSPORINE, PF, (CEQUA) 0.09 % SOLN Place 1 drop into both eyes every morning.    . diphenhydrAMINE (BENADRYL) 25 mg capsule Take 25 mg by mouth every 6 (six) hours as needed for itching or allergies.    . furosemide (LASIX) 20 MG tablet Take 20 mg by mouth every morning.    . gabapentin (NEURONTIN) 600 MG tablet Take 600 mg by mouth 3 (three) times daily.    . hydrALAZINE (APRESOLINE) 100 MG tablet Take 100 mg by mouth 2 (two) times daily.    Marland Kitchen losartan (COZAAR) 100 MG tablet Take 100 mg by mouth every evening.    . metoprolol (LOPRESSOR) 100 MG tablet Take 100 mg by mouth 2 (two) times daily.    . Multiple Vitamin (MULTIVITAMIN WITH MINERALS) TABS tablet Take 1 tablet by mouth daily.    . pravastatin (PRAVACHOL) 40 MG tablet Take 40 mg by mouth every evening.    . Probiotic  Product (PROBIOTIC DAILY) CAPS Take 1 capsule by mouth daily.    Marland Kitchen telmisartan (MICARDIS) 80 MG tablet Take 80 mg by mouth every morning.    Marland Kitchen glipiZIDE (GLUCOTROL XL) 10 MG 24 hr tablet Take 10 mg by mouth daily. (Patient not taking: Reported on 06/02/2020)    . metFORMIN (GLUCOPHAGE) 500 MG tablet Take 500 mg by mouth 2 (two) times daily with a meal. (Patient not taking: No sig reported)     No current facility-administered medications on file prior to visit.    ALLERGIES: Allergies  Allergen Reactions  . Codeine Itching and Rash    FAMILY HISTORY: Family History  Problem Relation Age of Onset  . Hypertension Mother   . Arthritis Sister     SOCIAL HISTORY: Social History   Socioeconomic History  . Marital status: Single    Spouse name: Not on file  . Number of children: Not on file  . Years of education: Not on file  . Highest education level: Not on file  Occupational History  . Occupation: retired Licensed conveyancer  Tobacco Use  . Smoking status: Former Smoker    Years: 1.00    Quit date: 01/06/1969    Years since quitting: 51.4  . Smokeless tobacco: Never Used  Substance and Sexual Activity  . Alcohol use: No  . Drug use: No  . Sexual activity: Not Currently  Other Topics Concern  . Not on file  Social History Narrative   Left Handed   [place] Home   No caffeine   Social Determinants of Health   Financial Resource Strain: Not on file  Food Insecurity: Not on file  Transportation Needs: Not on file  Physical Activity: Not on file  Stress: Not on file  Social Connections: Not on file  Intimate Partner Violence: Not on file     PHYSICAL EXAM: Vitals:   06/02/20 0921  BP: (!) 204/68  Pulse: (!) 58  Resp: 18  SpO2: 97%   General: No acute distress Head:  Normocephalic/atraumatic Skin/Extremities: No rash, chronic left arm lymphedema Neurological Exam: alert and awake. Slowed responses but answers appropriately. Fund of knowledge is appropriate.   Attention and concentration are normal.   Cranial nerves: Pupils equal, round. Extraocular movements intact with no nystagmus. Visual fields full.  No facial asymmetry.  Motor: increased tone on right UE and LE. 5/5 on right proximal UE, 3/5 distal LE with difficulty extending 4th and 5th digits (chronic), 4/5 left UE and LE.  Finger to nose testing intact.  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 She and her sister deny any seizures, she is on carbamazepine 200mg  TID without side effects, continue to monitor bone health and vitamin D with PCP. Her main concern today are frequent falls, likely due to neuropathy. EMG also noted the possibility of a superimposed lumbosacral radiculopathy at L3-5, MRI lumbar spine without contrast will be ordered. She is agreeable to home PT for gait instability. She would like to restart gabapentin since it helped in the past, we discussed starting low dose gabapentin 300mg  qhs, side effects discussed, may uptitrate as tolerated. Consider vestibular therapy for occasional positional vertigo. She does not drive. Follow-up in 6 months, they know to call for any changes.   Thank you for allowing me to participate in her care.  Please do not hesitate to call for any questions or concerns.   Donna Becker, M.D.   CC: Dr. Delfina Redwood

## 2020-06-02 NOTE — Patient Instructions (Addendum)
1. Schedule MRI lumbar spine without contrast  2. Referral will be sent for home physical therapy  3. Start Gabapentin 300mg : Take 1 tablet every night  4. Continue all your other medications  5. Follow-up in 6 months, call for any changes  We have sent a referral to [facility] for your MRI and they will call you directly to schedule your appointment. They are located at [address]. If you need to contact them directly please call [phone].

## 2020-06-07 ENCOUNTER — Telehealth: Payer: Self-pay

## 2020-06-07 DIAGNOSIS — G40009 Localization-related (focal) (partial) idiopathic epilepsy and epileptic syndromes with seizures of localized onset, not intractable, without status epilepticus: Secondary | ICD-10-CM | POA: Diagnosis not present

## 2020-06-07 DIAGNOSIS — I972 Postmastectomy lymphedema syndrome: Secondary | ICD-10-CM | POA: Diagnosis not present

## 2020-06-07 DIAGNOSIS — E119 Type 2 diabetes mellitus without complications: Secondary | ICD-10-CM | POA: Diagnosis not present

## 2020-06-07 DIAGNOSIS — H409 Unspecified glaucoma: Secondary | ICD-10-CM | POA: Diagnosis not present

## 2020-06-07 DIAGNOSIS — R296 Repeated falls: Secondary | ICD-10-CM | POA: Diagnosis not present

## 2020-06-07 DIAGNOSIS — G629 Polyneuropathy, unspecified: Secondary | ICD-10-CM | POA: Diagnosis not present

## 2020-06-07 DIAGNOSIS — G8111 Spastic hemiplegia affecting right dominant side: Secondary | ICD-10-CM | POA: Diagnosis not present

## 2020-06-07 DIAGNOSIS — I10 Essential (primary) hypertension: Secondary | ICD-10-CM | POA: Diagnosis not present

## 2020-06-07 DIAGNOSIS — M5440 Lumbago with sciatica, unspecified side: Secondary | ICD-10-CM | POA: Diagnosis not present

## 2020-06-07 NOTE — Telephone Encounter (Signed)
Asking for verbal orders for PT 1 time for week then 2 times 3 weeks, also pt stopped her gabapentin last week due to dizziness, after stopping the gabapentin she had 2 falls

## 2020-06-07 NOTE — Telephone Encounter (Signed)
[facility] for PT orders. Thanks for gabapentin update.

## 2020-06-07 NOTE — Telephone Encounter (Signed)
Cathy with [facility] called with verbal orders for PT

## 2020-06-13 DIAGNOSIS — G629 Polyneuropathy, unspecified: Secondary | ICD-10-CM | POA: Diagnosis not present

## 2020-06-13 DIAGNOSIS — G8111 Spastic hemiplegia affecting right dominant side: Secondary | ICD-10-CM | POA: Diagnosis not present

## 2020-06-13 DIAGNOSIS — I972 Postmastectomy lymphedema syndrome: Secondary | ICD-10-CM | POA: Diagnosis not present

## 2020-06-13 DIAGNOSIS — G40009 Localization-related (focal) (partial) idiopathic epilepsy and epileptic syndromes with seizures of localized onset, not intractable, without status epilepticus: Secondary | ICD-10-CM | POA: Diagnosis not present

## 2020-06-13 DIAGNOSIS — R296 Repeated falls: Secondary | ICD-10-CM | POA: Diagnosis not present

## 2020-06-13 DIAGNOSIS — H409 Unspecified glaucoma: Secondary | ICD-10-CM | POA: Diagnosis not present

## 2020-06-13 DIAGNOSIS — E119 Type 2 diabetes mellitus without complications: Secondary | ICD-10-CM | POA: Diagnosis not present

## 2020-06-13 DIAGNOSIS — I10 Essential (primary) hypertension: Secondary | ICD-10-CM | POA: Diagnosis not present

## 2020-06-13 DIAGNOSIS — M5440 Lumbago with sciatica, unspecified side: Secondary | ICD-10-CM | POA: Diagnosis not present

## 2020-06-15 DIAGNOSIS — E119 Type 2 diabetes mellitus without complications: Secondary | ICD-10-CM | POA: Diagnosis not present

## 2020-06-15 DIAGNOSIS — H409 Unspecified glaucoma: Secondary | ICD-10-CM | POA: Diagnosis not present

## 2020-06-15 DIAGNOSIS — R296 Repeated falls: Secondary | ICD-10-CM | POA: Diagnosis not present

## 2020-06-15 DIAGNOSIS — I10 Essential (primary) hypertension: Secondary | ICD-10-CM | POA: Diagnosis not present

## 2020-06-15 DIAGNOSIS — G629 Polyneuropathy, unspecified: Secondary | ICD-10-CM | POA: Diagnosis not present

## 2020-06-15 DIAGNOSIS — I972 Postmastectomy lymphedema syndrome: Secondary | ICD-10-CM | POA: Diagnosis not present

## 2020-06-15 DIAGNOSIS — M5440 Lumbago with sciatica, unspecified side: Secondary | ICD-10-CM | POA: Diagnosis not present

## 2020-06-15 DIAGNOSIS — G40009 Localization-related (focal) (partial) idiopathic epilepsy and epileptic syndromes with seizures of localized onset, not intractable, without status epilepticus: Secondary | ICD-10-CM | POA: Diagnosis not present

## 2020-06-15 DIAGNOSIS — G8111 Spastic hemiplegia affecting right dominant side: Secondary | ICD-10-CM | POA: Diagnosis not present

## 2020-06-20 DIAGNOSIS — R296 Repeated falls: Secondary | ICD-10-CM | POA: Diagnosis not present

## 2020-06-20 DIAGNOSIS — I972 Postmastectomy lymphedema syndrome: Secondary | ICD-10-CM | POA: Diagnosis not present

## 2020-06-20 DIAGNOSIS — G8111 Spastic hemiplegia affecting right dominant side: Secondary | ICD-10-CM | POA: Diagnosis not present

## 2020-06-20 DIAGNOSIS — M5440 Lumbago with sciatica, unspecified side: Secondary | ICD-10-CM | POA: Diagnosis not present

## 2020-06-20 DIAGNOSIS — I10 Essential (primary) hypertension: Secondary | ICD-10-CM | POA: Diagnosis not present

## 2020-06-20 DIAGNOSIS — G629 Polyneuropathy, unspecified: Secondary | ICD-10-CM | POA: Diagnosis not present

## 2020-06-20 DIAGNOSIS — H409 Unspecified glaucoma: Secondary | ICD-10-CM | POA: Diagnosis not present

## 2020-06-20 DIAGNOSIS — E119 Type 2 diabetes mellitus without complications: Secondary | ICD-10-CM | POA: Diagnosis not present

## 2020-06-20 DIAGNOSIS — G40009 Localization-related (focal) (partial) idiopathic epilepsy and epileptic syndromes with seizures of localized onset, not intractable, without status epilepticus: Secondary | ICD-10-CM | POA: Diagnosis not present

## 2020-06-23 DIAGNOSIS — I972 Postmastectomy lymphedema syndrome: Secondary | ICD-10-CM | POA: Diagnosis not present

## 2020-06-23 DIAGNOSIS — H409 Unspecified glaucoma: Secondary | ICD-10-CM | POA: Diagnosis not present

## 2020-06-23 DIAGNOSIS — I10 Essential (primary) hypertension: Secondary | ICD-10-CM | POA: Diagnosis not present

## 2020-06-23 DIAGNOSIS — G40009 Localization-related (focal) (partial) idiopathic epilepsy and epileptic syndromes with seizures of localized onset, not intractable, without status epilepticus: Secondary | ICD-10-CM | POA: Diagnosis not present

## 2020-06-23 DIAGNOSIS — G8111 Spastic hemiplegia affecting right dominant side: Secondary | ICD-10-CM | POA: Diagnosis not present

## 2020-06-23 DIAGNOSIS — E119 Type 2 diabetes mellitus without complications: Secondary | ICD-10-CM | POA: Diagnosis not present

## 2020-06-23 DIAGNOSIS — M5440 Lumbago with sciatica, unspecified side: Secondary | ICD-10-CM | POA: Diagnosis not present

## 2020-06-23 DIAGNOSIS — G629 Polyneuropathy, unspecified: Secondary | ICD-10-CM | POA: Diagnosis not present

## 2020-06-23 DIAGNOSIS — R296 Repeated falls: Secondary | ICD-10-CM | POA: Diagnosis not present

## 2020-06-26 DIAGNOSIS — G40009 Localization-related (focal) (partial) idiopathic epilepsy and epileptic syndromes with seizures of localized onset, not intractable, without status epilepticus: Secondary | ICD-10-CM | POA: Diagnosis not present

## 2020-06-26 DIAGNOSIS — G629 Polyneuropathy, unspecified: Secondary | ICD-10-CM | POA: Diagnosis not present

## 2020-06-26 DIAGNOSIS — I972 Postmastectomy lymphedema syndrome: Secondary | ICD-10-CM | POA: Diagnosis not present

## 2020-06-26 DIAGNOSIS — I10 Essential (primary) hypertension: Secondary | ICD-10-CM | POA: Diagnosis not present

## 2020-06-26 DIAGNOSIS — R296 Repeated falls: Secondary | ICD-10-CM | POA: Diagnosis not present

## 2020-06-26 DIAGNOSIS — G8111 Spastic hemiplegia affecting right dominant side: Secondary | ICD-10-CM | POA: Diagnosis not present

## 2020-06-26 DIAGNOSIS — H409 Unspecified glaucoma: Secondary | ICD-10-CM | POA: Diagnosis not present

## 2020-06-26 DIAGNOSIS — E119 Type 2 diabetes mellitus without complications: Secondary | ICD-10-CM | POA: Diagnosis not present

## 2020-06-26 DIAGNOSIS — M5440 Lumbago with sciatica, unspecified side: Secondary | ICD-10-CM | POA: Diagnosis not present

## 2020-06-30 DIAGNOSIS — G40009 Localization-related (focal) (partial) idiopathic epilepsy and epileptic syndromes with seizures of localized onset, not intractable, without status epilepticus: Secondary | ICD-10-CM | POA: Diagnosis not present

## 2020-06-30 DIAGNOSIS — H409 Unspecified glaucoma: Secondary | ICD-10-CM | POA: Diagnosis not present

## 2020-06-30 DIAGNOSIS — M5440 Lumbago with sciatica, unspecified side: Secondary | ICD-10-CM | POA: Diagnosis not present

## 2020-06-30 DIAGNOSIS — I972 Postmastectomy lymphedema syndrome: Secondary | ICD-10-CM | POA: Diagnosis not present

## 2020-06-30 DIAGNOSIS — G629 Polyneuropathy, unspecified: Secondary | ICD-10-CM | POA: Diagnosis not present

## 2020-06-30 DIAGNOSIS — R296 Repeated falls: Secondary | ICD-10-CM | POA: Diagnosis not present

## 2020-06-30 DIAGNOSIS — E119 Type 2 diabetes mellitus without complications: Secondary | ICD-10-CM | POA: Diagnosis not present

## 2020-06-30 DIAGNOSIS — G8111 Spastic hemiplegia affecting right dominant side: Secondary | ICD-10-CM | POA: Diagnosis not present

## 2020-06-30 DIAGNOSIS — I10 Essential (primary) hypertension: Secondary | ICD-10-CM | POA: Diagnosis not present

## 2020-07-12 DIAGNOSIS — E782 Mixed hyperlipidemia: Secondary | ICD-10-CM | POA: Diagnosis not present

## 2020-07-12 DIAGNOSIS — E139 Other specified diabetes mellitus without complications: Secondary | ICD-10-CM | POA: Diagnosis not present

## 2020-07-12 DIAGNOSIS — G8929 Other chronic pain: Secondary | ICD-10-CM | POA: Diagnosis not present

## 2020-07-12 DIAGNOSIS — E1169 Type 2 diabetes mellitus with other specified complication: Secondary | ICD-10-CM | POA: Diagnosis not present

## 2020-07-12 DIAGNOSIS — D509 Iron deficiency anemia, unspecified: Secondary | ICD-10-CM | POA: Diagnosis not present

## 2020-07-12 DIAGNOSIS — E1329 Other specified diabetes mellitus with other diabetic kidney complication: Secondary | ICD-10-CM | POA: Diagnosis not present

## 2020-07-12 DIAGNOSIS — E1129 Type 2 diabetes mellitus with other diabetic kidney complication: Secondary | ICD-10-CM | POA: Diagnosis not present

## 2020-07-12 DIAGNOSIS — I10 Essential (primary) hypertension: Secondary | ICD-10-CM | POA: Diagnosis not present

## 2020-08-08 DIAGNOSIS — E1169 Type 2 diabetes mellitus with other specified complication: Secondary | ICD-10-CM | POA: Diagnosis not present

## 2020-08-08 DIAGNOSIS — I10 Essential (primary) hypertension: Secondary | ICD-10-CM | POA: Diagnosis not present

## 2020-08-08 DIAGNOSIS — M25512 Pain in left shoulder: Secondary | ICD-10-CM | POA: Diagnosis not present

## 2020-08-08 DIAGNOSIS — E7800 Pure hypercholesterolemia, unspecified: Secondary | ICD-10-CM | POA: Diagnosis not present

## 2020-08-16 NOTE — Progress Notes (Signed)
Cardiology Office Note:   Date:  08/17/2020  NAME:  Donna Becker    MRN: [id number] DOB:  Oct 10, 1945   PCP:  Seward Carol, MD  Cardiologist:  Evalina Field, MD  Electrophysiologist:  None   Referring MD: Seward Carol, MD   Chief Complaint  Patient presents with  . Follow-up   History of Present Illness:   Donna Becker is a 75 y.o. female with a hx of LBBB, HTN, HLD, mild aortic stenosis who presents for follow-up.  Blood pressure severely elevated today 183/81.  She does not check it at home.  She reports when it has been checked it is high.  She is on metoprolol tartrate 100 twice daily, losartan 100 mg daily.  She also appears to be on Micardis as well.  This is to ARB's.  Not sure this is good for her.  She is on Lasix 20 mg daily.  No increased lower extremity edema.  She denies any significant chest pain or shortness of breath.  She is not that active.  She is limited by lymphedema in her left arm.  She reports she is just weak and has poor balance.  Still has a murmur consistent with aortic stenosis.  I can hear S2.  This is likely mild to moderate.  She will be due for an echocardiogram next year.  She denies any chest pain or trouble breathing.  Overall seems to be stable.  BP just needs better control.  Problem List 1. Diabetes -A1c 5.8 -T chol 180, HDL 58, LDL 95, TG 134 2. HTN 3. LBBB -EF 50-55% 4. Breast CA -lymphedema  5. Mild AS -V max 2.5 m/s, MG 13 mmHG  Past Medical History: Past Medical History:  Diagnosis Date  . Arthritis   . Cancer [facility])    left breast cancer /with retained lymph edema left arm  . Cerebral palsy, hemiplegic (HCC)    right sided weakness- ambulates with walker  . Diabetes mellitus without complication ([place])   . Glaucoma   . Gout   . History of shingles   . Hypertension   . Neuromuscular disorder (HCC)    neuropathy, right sided paresis, left arm numbness  . Seizure ([place])   . Seizures ([place])   . Transfusion history     '90's when had breast cancer surgery    Past Surgical History: Past Surgical History:  Procedure Laterality Date  . BIOPSY  09/29/2019   Procedure: BIOPSY;  Surgeon: Wilford Corner, MD;  Location: [facility];  Service: Endoscopy;;  . BREAST SURGERY Left    s/p left mastectomy  . CHOLECYSTECTOMY     open   . COLONOSCOPY WITH PROPOFOL N/A 09/29/2019   Procedure: COLONOSCOPY WITH PROPOFOL;  Surgeon: Wilford Corner, MD;  Location: [facility];  Service: Endoscopy;  Laterality: N/A;  . ESOPHAGOGASTRODUODENOSCOPY (EGD) WITH PROPOFOL N/A 09/29/2019   Procedure: ESOPHAGOGASTRODUODENOSCOPY (EGD) WITH PROPOFOL;  Surgeon: Wilford Corner, MD;  Location: [facility];  Service: Endoscopy;  Laterality: N/A;  . EYE SURGERY Right    corneal transplant  . FLEXIBLE SIGMOIDOSCOPY N/A 01/26/2013   Procedure: FLEXIBLE SIGMOIDOSCOPY;  Surgeon: Garlan Fair, MD;  Location: [facility];  Service: Endoscopy;  Laterality: N/A;  no sedation  . POLYPECTOMY  09/29/2019   Procedure: POLYPECTOMY;  Surgeon: Wilford Corner, MD;  Location: [facility];  Service: Endoscopy;;  . port-a-cath     implantation and removal  . TUBAL LIGATION      Current Medications: Current Meds  Medication Sig  .  allopurinol (ZYLOPRIM) 100 MG tablet Take 100 mg by mouth daily.  . B Complex-C (B-COMPLEX WITH VITAMIN C) tablet Take 1 tablet by mouth daily.  . bimatoprost (LUMIGAN) 0.01 % SOLN Place 1 drop into both eyes at bedtime.  . carbamazepine (TEGRETOL) 200 MG tablet Take 200 mg by mouth 3 (three) times daily.  . colchicine 0.6 MG tablet Take 0.6 mg by mouth daily.  . cycloSPORINE, PF, (CEQUA) 0.09 % SOLN Place 1 drop into both eyes every morning.  . fexofenadine (ALLEGRA) 180 MG tablet Take 180 mg by mouth daily.  . furosemide (LASIX) 20 MG tablet Take 20 mg by mouth every morning.  . gabapentin (NEURONTIN) 300 MG capsule Take 1 capsule (300 mg total) by mouth at bedtime.  Marland Kitchen glipiZIDE (GLUCOTROL XL) 10 MG 24 hr  tablet Take 10 mg by mouth daily.  . hydrALAZINE (APRESOLINE) 100 MG tablet Take 100 mg by mouth 2 (two) times daily.  Marland Kitchen losartan (COZAAR) 100 MG tablet Take 100 mg by mouth every evening.  . metFORMIN (GLUCOPHAGE) 500 MG tablet Take 500 mg by mouth 2 (two) times daily with a meal.  . metoprolol (LOPRESSOR) 100 MG tablet Take 100 mg by mouth 2 (two) times daily.  . Multiple Vitamin (MULTIVITAMIN WITH MINERALS) TABS tablet Take 1 tablet by mouth daily.  Marland Kitchen NIFEdipine (PROCARDIA XL/NIFEDICAL-XL) 90 MG 24 hr tablet Take 1 tablet (90 mg total) by mouth daily.  . pravastatin (PRAVACHOL) 40 MG tablet Take 40 mg by mouth every evening.  . Probiotic Product (PROBIOTIC DAILY) CAPS Take 1 capsule by mouth daily.  . [DISCONTINUED] telmisartan (MICARDIS) 80 MG tablet Take 80 mg by mouth every morning.     Allergies:    Codeine   Social History: Social History   Socioeconomic History  . Marital status: Single    Spouse name: Not on file  . Number of children: Not on file  . Years of education: Not on file  . Highest education level: Not on file  Occupational History  . Occupation: retired Licensed conveyancer  Tobacco Use  . Smoking status: Former Smoker    Years: 1.00    Quit date: 01/06/1969    Years since quitting: 51.6  . Smokeless tobacco: Never Used  Substance and Sexual Activity  . Alcohol use: No  . Drug use: No  . Sexual activity: Not Currently  Other Topics Concern  . Not on file  Social History Narrative   Left Handed   One Story Home   No caffeine   Social Determinants of Health   Financial Resource Strain: Not on file  Food Insecurity: Not on file  Transportation Needs: Not on file  Physical Activity: Not on file  Stress: Not on file  Social Connections: Not on file     Family History: The patient's family history includes Arthritis in her sister; Hypertension in her mother.  ROS:   All other ROS reviewed and negative. Pertinent positives noted in the HPI.      EKGs/Labs/Other Studies Reviewed:   The following studies were personally reviewed by me today:  EKG:  EKG is ordered today.  The ekg ordered today demonstrates sinus rhythm heart rate 58, left bundle branch block noted, and was personally reviewed by me.   NM Stress 05/26/2019   The left ventricular ejection fraction is mildly decreased (45-54%).  Nuclear stress EF: 54%.  Defect 1: There is a medium defect of mild severity present in the basal inferior, mid inferior and apical inferior location.  Defect 2: There is a medium defect of mild severity present in the basal anteroseptal, basal inferoseptal, mid anteroseptal, mid inferoseptal and apical septal location.  Findings consistent with prior myocardial infarction.  This is a low risk study. No ischemia   Fixed inferior defect with normal wall motion in inferior wall, consistent with artifact.  Fixed septal defect could represent prior infarct or artifact related to LBBB.  TTE 02/24/2019 1. Left ventricular ejection fraction, by visual estimation, is 50 to  55%. The left ventricle has low normal function. Left ventricular septal  wall thickness was mildly increased. There is mildly increased left  ventricular hypertrophy.  2. Definity contrast agent was given IV to delineate the left ventricular  endocardial borders.  3. Mildly dilated left ventricular internal cavity size.  4. Prominent mitral chords and trabeculaitons at mid and apical level  suggeting possible non compaction Distal septal hypokinesis.  5. Global right ventricle has normal systolic function.The right  ventricular size is normal. No increase in right ventricular wall  thickness.  6. Left atrial size was moderately dilated.  7. Right atrial size was normal.  8. The mitral valve is normal in structure. Mild mitral valve  regurgitation.  9. The tricuspid valve is normal in structure. Tricuspid valve  regurgitation is mild.  10. The aortic valve is  tricuspid. Aortic valve regurgitation is not  visualized. Mild aortic valve stenosis.  11. There is Moderate calcification of the aortic valve.  12. There is Moderate thickening of the aortic valve.  13. The pulmonic valve was grossly normal. Pulmonic valve regurgitation is  not visualized.  14. Moderately elevated pulmonary artery systolic pressure.   Recent Labs: No results found for requested labs within last 8760 hours.   Recent Lipid Panel    Component Value Date/Time   CHOL 150 12/28/2010 0620   TRIG 97 12/28/2010 0620   HDL 47 12/28/2010 0620   CHOLHDL 3.2 12/28/2010 0620   VLDL 19 12/28/2010 0620   LDLCALC 84 12/28/2010 0620    Physical Exam:   VS:  BP (!) 183/81   Pulse (!) 58   Ht 5\' 5"  (1.651 m)   Wt 166 lb (75.3 kg)   SpO2 99%   BMI 27.62 kg/m    Wt Readings from Last 3 Encounters:  08/17/20 166 lb (75.3 kg)  06/02/20 150 lb (68 kg)  10/28/19 179 lb (81.2 kg)    General: Well nourished, well developed, in no acute distress Head: Atraumatic, normal size  Eyes: PEERLA, EOMI  Neck: Supple, no JVD Endocrine: No thryomegaly Cardiac: Normal S1, S2; RRR; 2 out of 6 systolic ejection murmur, early peaking, S2 present Lungs: Clear to auscultation bilaterally, no wheezing, rhonchi or rales  Abd: Soft, nontender, no hepatomegaly  Ext: Trace edema Musculoskeletal: No deformities, BUE and BLE strength normal and equal Skin: Warm and dry, no rashes   Neuro: Alert and oriented to person, place, time, and situation, CNII-XII grossly intact, no focal deficits  Psych: Normal mood and affect   ASSESSMENT:   Donna Becker is a 75 y.o. female who presents for the following: 1. Primary hypertension   2. LBBB (left bundle branch block)   3. Mixed hyperlipidemia   4. Nonrheumatic aortic valve stenosis     PLAN:   1. Primary hypertension -BP severely elevated 183/81.  No symptoms.  Would recommend she not take 2 ARB's.  I would like to stop her Micardis.  Start her on  nifedipine 90 mg extended release.  She will continue metoprolol tartrate 100 twice daily.  She will also continue losartan 100 mg daily.  She is on hydralazine 100 mg twice daily.  She is on Lasix 20 mg daily.  If BP cannot get controlled neck step would be Aldactone.  I also would like for her to start checking her blood pressure daily.  She is not checking it at home.  She reports a low-salt diet.  She is not active or exercising.  No concerns for sleep apnea.  She will continue to see me and her primary care physician regarding this.  2. LBBB (left bundle branch block) -Stable.  3. Mixed hyperlipidemia -Continue statin.  She is not diabetic.  4. Nonrheumatic aortic valve stenosis -Mild aortic stenosis on echocardiogram last year.  I would like for her to see me back in 1 year and have an echocardiogram before that time.  We will continue to monitor her aortic stenosis.  Her murmur is consistent with mild disease.  Disposition: Return in about 1 year (around 08/17/2021).  Medication Adjustments/Labs and Tests Ordered: Current medicines are reviewed at length with the patient today.  Concerns regarding medicines are outlined above.  Orders Placed This Encounter  Procedures  . ECHOCARDIOGRAM COMPLETE   Meds ordered this encounter  Medications  . NIFEdipine (PROCARDIA XL/NIFEDICAL-XL) 90 MG 24 hr tablet    Sig: Take 1 tablet (90 mg total) by mouth daily.    Dispense:  90 tablet    Refill:  1    Patient Instructions  Medication Instructions:  Stop Telmisartan  Start Nifedipine 90 mg XR daily   *check your blood pressure once daily- call us in a week to let us know these numbers*  *If you need a refill on your cardiac medications before your next appointment, please call your pharmacy*   Testing/Procedures: Echocardiogram (12 months) - Your physician has requested that you have an echocardiogram. Echocardiography is a painless test that uses sound waves to create images of your  heart. It provides your doctor with information about the size and shape of your heart and how well your heart's chambers and valves are working. This procedure takes approximately one hour. There are no restrictions for this procedure. This will be performed at our [facility] location - [address].    Follow-Up: At [facility], you and your health needs are our priority.  As part of our continuing mission to provide you with exceptional heart care, we have created designated Provider Care Teams.  These Care Teams include your primary Cardiologist (physician) and Advanced Practice Providers (APPs -  Physician Assistants and Nurse Practitioners) who all work together to provide you with the care you need, when you need it.  We recommend signing up for the patient portal called "MyChart".  Sign up information is provided on this After Visit Summary.  MyChart is used to connect with patients for Virtual Visits (Telemedicine).  Patients are able to view lab/test results, encounter notes, upcoming appointments, etc.  Non-urgent messages can be sent to your provider as well.   To learn more about what you can do with MyChart, go to [email or web address].    Your next appointment:   12 month(s)  The format for your next appointment:   In Person  Provider:   Eleonore Chiquito, MD        Time Spent with Patient: I have spent a total of 25 minutes with patient reviewing hospital notes, telemetry, EKGs, labs and examining the  patient as well as establishing an assessment and plan that was discussed with the patient.  > 50% of time was spent in direct patient care.  Signed, Addison Naegeli. Audie Box, MD, [place]  [address], [place], [place] [address] [phone]  08/17/2020 9:40 AM

## 2020-08-17 ENCOUNTER — Ambulatory Visit: Payer: Medicare PPO | Admitting: Cardiovascular Disease

## 2020-08-17 ENCOUNTER — Other Ambulatory Visit: Payer: Self-pay

## 2020-08-17 ENCOUNTER — Encounter: Payer: Self-pay | Admitting: Cardiovascular Disease

## 2020-08-17 VITALS — BP 183/81 | HR 58 | Ht 65.0 in | Wt 166.0 lb

## 2020-08-17 DIAGNOSIS — I10 Essential (primary) hypertension: Secondary | ICD-10-CM

## 2020-08-17 DIAGNOSIS — E782 Mixed hyperlipidemia: Secondary | ICD-10-CM

## 2020-08-17 DIAGNOSIS — I447 Left bundle-branch block, unspecified: Secondary | ICD-10-CM | POA: Diagnosis not present

## 2020-08-17 DIAGNOSIS — I350 Nonrheumatic aortic (valve) stenosis: Secondary | ICD-10-CM

## 2020-08-17 MED ORDER — NIFEDIPINE ER OSMOTIC RELEASE 90 MG PO TB24: 90.0000 mg | ORAL_TABLET | Freq: Every day | ORAL | 1 refills | Status: DC

## 2020-08-17 NOTE — Patient Instructions (Addendum)
Medication Instructions:  Stop Telmisartan  Start Nifedipine 90 mg XR daily   *check your blood pressure once daily- call us in a week to let us know these numbers*  *If you need a refill on your cardiac medications before your next appointment, please call your pharmacy*   Testing/Procedures: Echocardiogram (12 months) - Your physician has requested that you have an echocardiogram. Echocardiography is a painless test that uses sound waves to create images of your heart. It provides your doctor with information about the size and shape of your heart and how well your heart's chambers and valves are working. This procedure takes approximately one hour. There are no restrictions for this procedure. This will be performed at our [facility] location - [address], [address].    Follow-Up: At [facility], you and your health needs are our priority.  As part of our continuing mission to provide you with exceptional heart care, we have created designated Provider Care Teams.  These Care Teams include your primary Cardiologist (physician) and Advanced Practice Providers (APPs -  Physician Assistants and Nurse Practitioners) who all work together to provide you with the care you need, when you need it.  We recommend signing up for the patient portal called "MyChart".  Sign up information is provided on this After Visit Summary.  MyChart is used to connect with patients for Virtual Visits (Telemedicine).  Patients are able to view lab/test results, encounter notes, upcoming appointments, etc.  Non-urgent messages can be sent to your provider as well.   To learn more about what you can do with MyChart, go to [email or web address].    Your next appointment:   12 month(s)  The format for your next appointment:   In Person  Provider:   Eleonore Chiquito, MD

## 2020-08-23 DIAGNOSIS — M19212 Secondary osteoarthritis, left shoulder: Secondary | ICD-10-CM | POA: Diagnosis not present

## 2020-08-31 ENCOUNTER — Telehealth: Payer: Self-pay | Admitting: Cardiovascular Disease

## 2020-08-31 NOTE — Telephone Encounter (Signed)
Pt started new meds X1 week ago and was asked to callback with BP Readings:  149/83 08/19/20 167/92 08/20/20 183/63 08/21/20 198/88 08/22/20 198/88 08/23/20 123/72 08/24/20 163/72 08/25/20 140/69 08/26/20 150/80 08/27/20 165/80 08/28/20 185/88 08/29/20 148/68 08/30/20 180/94 Today

## 2020-09-01 MED ORDER — CARVEDILOL 25 MG PO TABS: 25.0000 mg | ORAL_TABLET | Freq: Two times a day (BID) | ORAL | 3 refills | Status: DC

## 2020-09-01 NOTE — Telephone Encounter (Signed)
Pt informed of providers result & recommendations. Pt verbalized understanding. No further questions . New rx sent to requested pharmacy. 

## 2020-09-06 NOTE — Addendum Note (Signed)
Addended by: Jacqulynn Cadet on: 09/06/2020 04:10 PM   Modules accepted: Orders

## 2020-10-30 DIAGNOSIS — W19XXXA Unspecified fall, initial encounter: Secondary | ICD-10-CM | POA: Diagnosis not present

## 2020-10-30 DIAGNOSIS — I10 Essential (primary) hypertension: Secondary | ICD-10-CM | POA: Diagnosis not present

## 2020-10-30 DIAGNOSIS — S060X0A Concussion without loss of consciousness, initial encounter: Secondary | ICD-10-CM | POA: Diagnosis not present

## 2020-10-30 DIAGNOSIS — S0003XA Contusion of scalp, initial encounter: Secondary | ICD-10-CM | POA: Diagnosis not present

## 2020-11-05 DIAGNOSIS — M4800 Spinal stenosis, site unspecified: Secondary | ICD-10-CM | POA: Diagnosis not present

## 2020-11-05 DIAGNOSIS — M8580 Other specified disorders of bone density and structure, unspecified site: Secondary | ICD-10-CM | POA: Diagnosis not present

## 2020-11-05 DIAGNOSIS — S060X0D Concussion without loss of consciousness, subsequent encounter: Secondary | ICD-10-CM | POA: Diagnosis not present

## 2020-11-05 DIAGNOSIS — I890 Lymphedema, not elsewhere classified: Secondary | ICD-10-CM | POA: Diagnosis not present

## 2020-11-05 DIAGNOSIS — M5410 Radiculopathy, site unspecified: Secondary | ICD-10-CM | POA: Diagnosis not present

## 2020-11-05 DIAGNOSIS — E1136 Type 2 diabetes mellitus with diabetic cataract: Secondary | ICD-10-CM | POA: Diagnosis not present

## 2020-11-05 DIAGNOSIS — I10 Essential (primary) hypertension: Secondary | ICD-10-CM | POA: Diagnosis not present

## 2020-11-05 DIAGNOSIS — G8929 Other chronic pain: Secondary | ICD-10-CM | POA: Diagnosis not present

## 2020-11-05 DIAGNOSIS — G808 Other cerebral palsy: Secondary | ICD-10-CM | POA: Diagnosis not present

## 2020-11-08 DIAGNOSIS — I10 Essential (primary) hypertension: Secondary | ICD-10-CM | POA: Diagnosis not present

## 2020-11-08 DIAGNOSIS — S060X0D Concussion without loss of consciousness, subsequent encounter: Secondary | ICD-10-CM | POA: Diagnosis not present

## 2020-11-08 DIAGNOSIS — G808 Other cerebral palsy: Secondary | ICD-10-CM | POA: Diagnosis not present

## 2020-11-08 DIAGNOSIS — E1136 Type 2 diabetes mellitus with diabetic cataract: Secondary | ICD-10-CM | POA: Diagnosis not present

## 2020-11-08 DIAGNOSIS — G8929 Other chronic pain: Secondary | ICD-10-CM | POA: Diagnosis not present

## 2020-11-08 DIAGNOSIS — M5410 Radiculopathy, site unspecified: Secondary | ICD-10-CM | POA: Diagnosis not present

## 2020-11-08 DIAGNOSIS — I890 Lymphedema, not elsewhere classified: Secondary | ICD-10-CM | POA: Diagnosis not present

## 2020-11-08 DIAGNOSIS — M8580 Other specified disorders of bone density and structure, unspecified site: Secondary | ICD-10-CM | POA: Diagnosis not present

## 2020-11-08 DIAGNOSIS — M4800 Spinal stenosis, site unspecified: Secondary | ICD-10-CM | POA: Diagnosis not present

## 2020-11-14 DIAGNOSIS — I890 Lymphedema, not elsewhere classified: Secondary | ICD-10-CM | POA: Diagnosis not present

## 2020-11-14 DIAGNOSIS — G808 Other cerebral palsy: Secondary | ICD-10-CM | POA: Diagnosis not present

## 2020-11-14 DIAGNOSIS — G8929 Other chronic pain: Secondary | ICD-10-CM | POA: Diagnosis not present

## 2020-11-14 DIAGNOSIS — M4800 Spinal stenosis, site unspecified: Secondary | ICD-10-CM | POA: Diagnosis not present

## 2020-11-14 DIAGNOSIS — E1136 Type 2 diabetes mellitus with diabetic cataract: Secondary | ICD-10-CM | POA: Diagnosis not present

## 2020-11-14 DIAGNOSIS — M5410 Radiculopathy, site unspecified: Secondary | ICD-10-CM | POA: Diagnosis not present

## 2020-11-14 DIAGNOSIS — I10 Essential (primary) hypertension: Secondary | ICD-10-CM | POA: Diagnosis not present

## 2020-11-14 DIAGNOSIS — M8580 Other specified disorders of bone density and structure, unspecified site: Secondary | ICD-10-CM | POA: Diagnosis not present

## 2020-11-14 DIAGNOSIS — S060X0D Concussion without loss of consciousness, subsequent encounter: Secondary | ICD-10-CM | POA: Diagnosis not present

## 2020-11-16 DIAGNOSIS — S060X0D Concussion without loss of consciousness, subsequent encounter: Secondary | ICD-10-CM | POA: Diagnosis not present

## 2020-11-16 DIAGNOSIS — I10 Essential (primary) hypertension: Secondary | ICD-10-CM | POA: Diagnosis not present

## 2020-11-16 DIAGNOSIS — M5410 Radiculopathy, site unspecified: Secondary | ICD-10-CM | POA: Diagnosis not present

## 2020-11-16 DIAGNOSIS — M4800 Spinal stenosis, site unspecified: Secondary | ICD-10-CM | POA: Diagnosis not present

## 2020-11-16 DIAGNOSIS — M8580 Other specified disorders of bone density and structure, unspecified site: Secondary | ICD-10-CM | POA: Diagnosis not present

## 2020-11-16 DIAGNOSIS — G8929 Other chronic pain: Secondary | ICD-10-CM | POA: Diagnosis not present

## 2020-11-16 DIAGNOSIS — E1136 Type 2 diabetes mellitus with diabetic cataract: Secondary | ICD-10-CM | POA: Diagnosis not present

## 2020-11-16 DIAGNOSIS — E119 Type 2 diabetes mellitus without complications: Secondary | ICD-10-CM | POA: Diagnosis not present

## 2020-11-16 DIAGNOSIS — G808 Other cerebral palsy: Secondary | ICD-10-CM | POA: Diagnosis not present

## 2020-11-16 DIAGNOSIS — I890 Lymphedema, not elsewhere classified: Secondary | ICD-10-CM | POA: Diagnosis not present

## 2020-11-17 DIAGNOSIS — E119 Type 2 diabetes mellitus without complications: Secondary | ICD-10-CM | POA: Diagnosis not present

## 2020-11-22 DIAGNOSIS — G808 Other cerebral palsy: Secondary | ICD-10-CM | POA: Diagnosis not present

## 2020-11-22 DIAGNOSIS — S060X0D Concussion without loss of consciousness, subsequent encounter: Secondary | ICD-10-CM | POA: Diagnosis not present

## 2020-11-22 DIAGNOSIS — M5410 Radiculopathy, site unspecified: Secondary | ICD-10-CM | POA: Diagnosis not present

## 2020-11-22 DIAGNOSIS — I890 Lymphedema, not elsewhere classified: Secondary | ICD-10-CM | POA: Diagnosis not present

## 2020-11-22 DIAGNOSIS — I10 Essential (primary) hypertension: Secondary | ICD-10-CM | POA: Diagnosis not present

## 2020-11-22 DIAGNOSIS — G8929 Other chronic pain: Secondary | ICD-10-CM | POA: Diagnosis not present

## 2020-11-22 DIAGNOSIS — E1136 Type 2 diabetes mellitus with diabetic cataract: Secondary | ICD-10-CM | POA: Diagnosis not present

## 2020-11-22 DIAGNOSIS — M4800 Spinal stenosis, site unspecified: Secondary | ICD-10-CM | POA: Diagnosis not present

## 2020-11-22 DIAGNOSIS — M8580 Other specified disorders of bone density and structure, unspecified site: Secondary | ICD-10-CM | POA: Diagnosis not present

## 2020-11-23 DIAGNOSIS — M8580 Other specified disorders of bone density and structure, unspecified site: Secondary | ICD-10-CM | POA: Diagnosis not present

## 2020-11-23 DIAGNOSIS — M4800 Spinal stenosis, site unspecified: Secondary | ICD-10-CM | POA: Diagnosis not present

## 2020-11-23 DIAGNOSIS — G8929 Other chronic pain: Secondary | ICD-10-CM | POA: Diagnosis not present

## 2020-11-23 DIAGNOSIS — E1136 Type 2 diabetes mellitus with diabetic cataract: Secondary | ICD-10-CM | POA: Diagnosis not present

## 2020-11-23 DIAGNOSIS — I890 Lymphedema, not elsewhere classified: Secondary | ICD-10-CM | POA: Diagnosis not present

## 2020-11-23 DIAGNOSIS — M5410 Radiculopathy, site unspecified: Secondary | ICD-10-CM | POA: Diagnosis not present

## 2020-11-23 DIAGNOSIS — S060X0D Concussion without loss of consciousness, subsequent encounter: Secondary | ICD-10-CM | POA: Diagnosis not present

## 2020-11-23 DIAGNOSIS — I10 Essential (primary) hypertension: Secondary | ICD-10-CM | POA: Diagnosis not present

## 2020-11-23 DIAGNOSIS — G808 Other cerebral palsy: Secondary | ICD-10-CM | POA: Diagnosis not present

## 2020-11-24 DIAGNOSIS — E119 Type 2 diabetes mellitus without complications: Secondary | ICD-10-CM | POA: Diagnosis not present

## 2020-11-28 DIAGNOSIS — G808 Other cerebral palsy: Secondary | ICD-10-CM | POA: Diagnosis not present

## 2020-11-28 DIAGNOSIS — G8929 Other chronic pain: Secondary | ICD-10-CM | POA: Diagnosis not present

## 2020-11-28 DIAGNOSIS — I890 Lymphedema, not elsewhere classified: Secondary | ICD-10-CM | POA: Diagnosis not present

## 2020-11-28 DIAGNOSIS — I10 Essential (primary) hypertension: Secondary | ICD-10-CM | POA: Diagnosis not present

## 2020-11-28 DIAGNOSIS — M8580 Other specified disorders of bone density and structure, unspecified site: Secondary | ICD-10-CM | POA: Diagnosis not present

## 2020-11-28 DIAGNOSIS — E1136 Type 2 diabetes mellitus with diabetic cataract: Secondary | ICD-10-CM | POA: Diagnosis not present

## 2020-11-28 DIAGNOSIS — M5410 Radiculopathy, site unspecified: Secondary | ICD-10-CM | POA: Diagnosis not present

## 2020-11-28 DIAGNOSIS — S060X0D Concussion without loss of consciousness, subsequent encounter: Secondary | ICD-10-CM | POA: Diagnosis not present

## 2020-11-28 DIAGNOSIS — M4800 Spinal stenosis, site unspecified: Secondary | ICD-10-CM | POA: Diagnosis not present

## 2020-12-01 DIAGNOSIS — M8580 Other specified disorders of bone density and structure, unspecified site: Secondary | ICD-10-CM | POA: Diagnosis not present

## 2020-12-01 DIAGNOSIS — M4800 Spinal stenosis, site unspecified: Secondary | ICD-10-CM | POA: Diagnosis not present

## 2020-12-01 DIAGNOSIS — G8929 Other chronic pain: Secondary | ICD-10-CM | POA: Diagnosis not present

## 2020-12-01 DIAGNOSIS — S060X0D Concussion without loss of consciousness, subsequent encounter: Secondary | ICD-10-CM | POA: Diagnosis not present

## 2020-12-01 DIAGNOSIS — E1136 Type 2 diabetes mellitus with diabetic cataract: Secondary | ICD-10-CM | POA: Diagnosis not present

## 2020-12-01 DIAGNOSIS — I890 Lymphedema, not elsewhere classified: Secondary | ICD-10-CM | POA: Diagnosis not present

## 2020-12-01 DIAGNOSIS — G808 Other cerebral palsy: Secondary | ICD-10-CM | POA: Diagnosis not present

## 2020-12-01 DIAGNOSIS — I10 Essential (primary) hypertension: Secondary | ICD-10-CM | POA: Diagnosis not present

## 2020-12-01 DIAGNOSIS — M5410 Radiculopathy, site unspecified: Secondary | ICD-10-CM | POA: Diagnosis not present

## 2020-12-04 DIAGNOSIS — G8929 Other chronic pain: Secondary | ICD-10-CM | POA: Diagnosis not present

## 2020-12-04 DIAGNOSIS — M4800 Spinal stenosis, site unspecified: Secondary | ICD-10-CM | POA: Diagnosis not present

## 2020-12-04 DIAGNOSIS — I890 Lymphedema, not elsewhere classified: Secondary | ICD-10-CM | POA: Diagnosis not present

## 2020-12-04 DIAGNOSIS — I10 Essential (primary) hypertension: Secondary | ICD-10-CM | POA: Diagnosis not present

## 2020-12-04 DIAGNOSIS — G808 Other cerebral palsy: Secondary | ICD-10-CM | POA: Diagnosis not present

## 2020-12-04 DIAGNOSIS — M5410 Radiculopathy, site unspecified: Secondary | ICD-10-CM | POA: Diagnosis not present

## 2020-12-04 DIAGNOSIS — E1136 Type 2 diabetes mellitus with diabetic cataract: Secondary | ICD-10-CM | POA: Diagnosis not present

## 2020-12-04 DIAGNOSIS — S060X0D Concussion without loss of consciousness, subsequent encounter: Secondary | ICD-10-CM | POA: Diagnosis not present

## 2020-12-04 DIAGNOSIS — M8580 Other specified disorders of bone density and structure, unspecified site: Secondary | ICD-10-CM | POA: Diagnosis not present

## 2020-12-05 DIAGNOSIS — E1136 Type 2 diabetes mellitus with diabetic cataract: Secondary | ICD-10-CM | POA: Diagnosis not present

## 2020-12-05 DIAGNOSIS — G8929 Other chronic pain: Secondary | ICD-10-CM | POA: Diagnosis not present

## 2020-12-05 DIAGNOSIS — S060X0D Concussion without loss of consciousness, subsequent encounter: Secondary | ICD-10-CM | POA: Diagnosis not present

## 2020-12-05 DIAGNOSIS — M8580 Other specified disorders of bone density and structure, unspecified site: Secondary | ICD-10-CM | POA: Diagnosis not present

## 2020-12-05 DIAGNOSIS — M5410 Radiculopathy, site unspecified: Secondary | ICD-10-CM | POA: Diagnosis not present

## 2020-12-05 DIAGNOSIS — M4800 Spinal stenosis, site unspecified: Secondary | ICD-10-CM | POA: Diagnosis not present

## 2020-12-05 DIAGNOSIS — I890 Lymphedema, not elsewhere classified: Secondary | ICD-10-CM | POA: Diagnosis not present

## 2020-12-05 DIAGNOSIS — I10 Essential (primary) hypertension: Secondary | ICD-10-CM | POA: Diagnosis not present

## 2020-12-05 DIAGNOSIS — G808 Other cerebral palsy: Secondary | ICD-10-CM | POA: Diagnosis not present

## 2020-12-07 DIAGNOSIS — M109 Gout, unspecified: Secondary | ICD-10-CM | POA: Diagnosis not present

## 2020-12-07 DIAGNOSIS — G8929 Other chronic pain: Secondary | ICD-10-CM | POA: Diagnosis not present

## 2020-12-07 DIAGNOSIS — G802 Spastic hemiplegic cerebral palsy: Secondary | ICD-10-CM | POA: Diagnosis not present

## 2020-12-07 DIAGNOSIS — G629 Polyneuropathy, unspecified: Secondary | ICD-10-CM | POA: Diagnosis not present

## 2020-12-07 DIAGNOSIS — I10 Essential (primary) hypertension: Secondary | ICD-10-CM | POA: Diagnosis not present

## 2020-12-07 DIAGNOSIS — H409 Unspecified glaucoma: Secondary | ICD-10-CM | POA: Diagnosis not present

## 2020-12-07 DIAGNOSIS — G40909 Epilepsy, unspecified, not intractable, without status epilepticus: Secondary | ICD-10-CM | POA: Diagnosis not present

## 2020-12-07 DIAGNOSIS — K08409 Partial loss of teeth, unspecified cause, unspecified class: Secondary | ICD-10-CM | POA: Diagnosis not present

## 2020-12-07 DIAGNOSIS — E669 Obesity, unspecified: Secondary | ICD-10-CM | POA: Diagnosis not present

## 2020-12-12 DIAGNOSIS — I10 Essential (primary) hypertension: Secondary | ICD-10-CM | POA: Diagnosis not present

## 2020-12-12 DIAGNOSIS — M5410 Radiculopathy, site unspecified: Secondary | ICD-10-CM | POA: Diagnosis not present

## 2020-12-12 DIAGNOSIS — G809 Cerebral palsy, unspecified: Secondary | ICD-10-CM | POA: Diagnosis not present

## 2020-12-12 DIAGNOSIS — G8929 Other chronic pain: Secondary | ICD-10-CM | POA: Diagnosis not present

## 2020-12-12 DIAGNOSIS — E1136 Type 2 diabetes mellitus with diabetic cataract: Secondary | ICD-10-CM | POA: Diagnosis not present

## 2020-12-12 DIAGNOSIS — S060X0D Concussion without loss of consciousness, subsequent encounter: Secondary | ICD-10-CM | POA: Diagnosis not present

## 2020-12-12 DIAGNOSIS — I890 Lymphedema, not elsewhere classified: Secondary | ICD-10-CM | POA: Diagnosis not present

## 2020-12-12 DIAGNOSIS — G808 Other cerebral palsy: Secondary | ICD-10-CM | POA: Diagnosis not present

## 2020-12-12 DIAGNOSIS — M4800 Spinal stenosis, site unspecified: Secondary | ICD-10-CM | POA: Diagnosis not present

## 2020-12-12 DIAGNOSIS — M8580 Other specified disorders of bone density and structure, unspecified site: Secondary | ICD-10-CM | POA: Diagnosis not present

## 2020-12-12 DIAGNOSIS — I359 Nonrheumatic aortic valve disorder, unspecified: Secondary | ICD-10-CM | POA: Diagnosis not present

## 2020-12-13 DIAGNOSIS — I10 Essential (primary) hypertension: Secondary | ICD-10-CM | POA: Diagnosis not present

## 2020-12-13 DIAGNOSIS — M5410 Radiculopathy, site unspecified: Secondary | ICD-10-CM | POA: Diagnosis not present

## 2020-12-13 DIAGNOSIS — M4800 Spinal stenosis, site unspecified: Secondary | ICD-10-CM | POA: Diagnosis not present

## 2020-12-13 DIAGNOSIS — G808 Other cerebral palsy: Secondary | ICD-10-CM | POA: Diagnosis not present

## 2020-12-13 DIAGNOSIS — M8580 Other specified disorders of bone density and structure, unspecified site: Secondary | ICD-10-CM | POA: Diagnosis not present

## 2020-12-13 DIAGNOSIS — S060X0D Concussion without loss of consciousness, subsequent encounter: Secondary | ICD-10-CM | POA: Diagnosis not present

## 2020-12-13 DIAGNOSIS — E1136 Type 2 diabetes mellitus with diabetic cataract: Secondary | ICD-10-CM | POA: Diagnosis not present

## 2020-12-13 DIAGNOSIS — I890 Lymphedema, not elsewhere classified: Secondary | ICD-10-CM | POA: Diagnosis not present

## 2020-12-13 DIAGNOSIS — G8929 Other chronic pain: Secondary | ICD-10-CM | POA: Diagnosis not present

## 2020-12-14 DIAGNOSIS — G8929 Other chronic pain: Secondary | ICD-10-CM | POA: Diagnosis not present

## 2020-12-14 DIAGNOSIS — G808 Other cerebral palsy: Secondary | ICD-10-CM | POA: Diagnosis not present

## 2020-12-14 DIAGNOSIS — M4800 Spinal stenosis, site unspecified: Secondary | ICD-10-CM | POA: Diagnosis not present

## 2020-12-14 DIAGNOSIS — M8580 Other specified disorders of bone density and structure, unspecified site: Secondary | ICD-10-CM | POA: Diagnosis not present

## 2020-12-14 DIAGNOSIS — M5410 Radiculopathy, site unspecified: Secondary | ICD-10-CM | POA: Diagnosis not present

## 2020-12-14 DIAGNOSIS — E1136 Type 2 diabetes mellitus with diabetic cataract: Secondary | ICD-10-CM | POA: Diagnosis not present

## 2020-12-14 DIAGNOSIS — I890 Lymphedema, not elsewhere classified: Secondary | ICD-10-CM | POA: Diagnosis not present

## 2020-12-14 DIAGNOSIS — I10 Essential (primary) hypertension: Secondary | ICD-10-CM | POA: Diagnosis not present

## 2020-12-14 DIAGNOSIS — S060X0D Concussion without loss of consciousness, subsequent encounter: Secondary | ICD-10-CM | POA: Diagnosis not present

## 2020-12-18 ENCOUNTER — Other Ambulatory Visit: Payer: Self-pay | Admitting: Cardiovascular Disease

## 2020-12-18 DIAGNOSIS — S060X0D Concussion without loss of consciousness, subsequent encounter: Secondary | ICD-10-CM | POA: Diagnosis not present

## 2020-12-18 DIAGNOSIS — I10 Essential (primary) hypertension: Secondary | ICD-10-CM | POA: Diagnosis not present

## 2020-12-18 DIAGNOSIS — G808 Other cerebral palsy: Secondary | ICD-10-CM | POA: Diagnosis not present

## 2020-12-18 DIAGNOSIS — M8580 Other specified disorders of bone density and structure, unspecified site: Secondary | ICD-10-CM | POA: Diagnosis not present

## 2020-12-18 DIAGNOSIS — M4800 Spinal stenosis, site unspecified: Secondary | ICD-10-CM | POA: Diagnosis not present

## 2020-12-18 DIAGNOSIS — M5410 Radiculopathy, site unspecified: Secondary | ICD-10-CM | POA: Diagnosis not present

## 2020-12-18 DIAGNOSIS — G8929 Other chronic pain: Secondary | ICD-10-CM | POA: Diagnosis not present

## 2020-12-18 DIAGNOSIS — E1136 Type 2 diabetes mellitus with diabetic cataract: Secondary | ICD-10-CM | POA: Diagnosis not present

## 2020-12-18 DIAGNOSIS — I890 Lymphedema, not elsewhere classified: Secondary | ICD-10-CM | POA: Diagnosis not present

## 2020-12-19 ENCOUNTER — Other Ambulatory Visit: Payer: Self-pay

## 2020-12-19 ENCOUNTER — Ambulatory Visit: Payer: Medicare PPO | Admitting: Podiatry

## 2020-12-19 ENCOUNTER — Encounter: Payer: Self-pay | Admitting: Podiatry

## 2020-12-19 DIAGNOSIS — L84 Corns and callosities: Secondary | ICD-10-CM | POA: Diagnosis not present

## 2020-12-19 DIAGNOSIS — M79675 Pain in left toe(s): Secondary | ICD-10-CM | POA: Diagnosis not present

## 2020-12-19 DIAGNOSIS — M4800 Spinal stenosis, site unspecified: Secondary | ICD-10-CM | POA: Diagnosis not present

## 2020-12-19 DIAGNOSIS — G8929 Other chronic pain: Secondary | ICD-10-CM | POA: Diagnosis not present

## 2020-12-19 DIAGNOSIS — I10 Essential (primary) hypertension: Secondary | ICD-10-CM | POA: Diagnosis not present

## 2020-12-19 DIAGNOSIS — G808 Other cerebral palsy: Secondary | ICD-10-CM | POA: Diagnosis not present

## 2020-12-19 DIAGNOSIS — B351 Tinea unguium: Secondary | ICD-10-CM | POA: Diagnosis not present

## 2020-12-19 DIAGNOSIS — E1136 Type 2 diabetes mellitus with diabetic cataract: Secondary | ICD-10-CM | POA: Diagnosis not present

## 2020-12-19 DIAGNOSIS — M5410 Radiculopathy, site unspecified: Secondary | ICD-10-CM | POA: Diagnosis not present

## 2020-12-19 DIAGNOSIS — S060X0D Concussion without loss of consciousness, subsequent encounter: Secondary | ICD-10-CM | POA: Diagnosis not present

## 2020-12-19 DIAGNOSIS — I890 Lymphedema, not elsewhere classified: Secondary | ICD-10-CM | POA: Diagnosis not present

## 2020-12-19 DIAGNOSIS — M79674 Pain in right toe(s): Secondary | ICD-10-CM | POA: Diagnosis not present

## 2020-12-19 DIAGNOSIS — M8580 Other specified disorders of bone density and structure, unspecified site: Secondary | ICD-10-CM | POA: Diagnosis not present

## 2020-12-19 NOTE — Progress Notes (Signed)
This patient presents  to my office for at risk foot care.  This patient requires this care by a professional since this patient will be at risk due to having diabetes with no complications. This patient is unable to cut nails herself since the patient cannot reach her nails.These nails are painful walking and wearing shoes. She has painful callus on the tip of her fourth toe left foot.  This patient presents for at risk foot care today.  General Appearance  Alert, conversant and in no acute stress.  Vascular  Dorsalis pedis and posterior tibial  pulses are palpable  bilaterally.  Capillary return is within normal limits  bilaterally. Temperature is within normal limits  bilaterally.  Neurologic  Senn-Weinstein monofilament wire test within normal limits  bilaterally. Muscle power within normal limits bilaterally.  Nails Thick disfigured discolored nails with subungual debris  from hallux to fifth toes bilaterally. No evidence of bacterial infection or drainage bilaterally.  Orthopedic  No limitations of motion  feet .  No crepitus or effusions noted.  No bony pathology or digital deformities noted.  Skin  normotropic skin with no porokeratosis noted bilaterally.  No signs of infections or ulcers noted.   Clavi 4th toe left foot.  Onychomycosis  Pain in right toes  Pain in left toes  Clavi left foot.  Consent was obtained for treatment procedures.   Mechanical debridement of nails 1-5  bilaterally performed with a nail nipper.  Filed with dremel without incident. Debride clavi with # 15 blade.  Padding dispensed.   Return office visit  4 months                    Told patient to return for periodic foot care and evaluation due to potential at risk complications.   Gardiner Barefoot DPM

## 2020-12-25 DIAGNOSIS — M8580 Other specified disorders of bone density and structure, unspecified site: Secondary | ICD-10-CM | POA: Diagnosis not present

## 2020-12-25 DIAGNOSIS — E1136 Type 2 diabetes mellitus with diabetic cataract: Secondary | ICD-10-CM | POA: Diagnosis not present

## 2020-12-25 DIAGNOSIS — G8929 Other chronic pain: Secondary | ICD-10-CM | POA: Diagnosis not present

## 2020-12-25 DIAGNOSIS — M5410 Radiculopathy, site unspecified: Secondary | ICD-10-CM | POA: Diagnosis not present

## 2020-12-25 DIAGNOSIS — S060X0D Concussion without loss of consciousness, subsequent encounter: Secondary | ICD-10-CM | POA: Diagnosis not present

## 2020-12-25 DIAGNOSIS — G808 Other cerebral palsy: Secondary | ICD-10-CM | POA: Diagnosis not present

## 2020-12-25 DIAGNOSIS — I10 Essential (primary) hypertension: Secondary | ICD-10-CM | POA: Diagnosis not present

## 2020-12-25 DIAGNOSIS — I890 Lymphedema, not elsewhere classified: Secondary | ICD-10-CM | POA: Diagnosis not present

## 2020-12-25 DIAGNOSIS — M4800 Spinal stenosis, site unspecified: Secondary | ICD-10-CM | POA: Diagnosis not present

## 2020-12-27 ENCOUNTER — Ambulatory Visit: Payer: Medicare PPO | Admitting: Neurology

## 2020-12-28 DIAGNOSIS — M5410 Radiculopathy, site unspecified: Secondary | ICD-10-CM | POA: Diagnosis not present

## 2020-12-28 DIAGNOSIS — E1136 Type 2 diabetes mellitus with diabetic cataract: Secondary | ICD-10-CM | POA: Diagnosis not present

## 2020-12-28 DIAGNOSIS — I10 Essential (primary) hypertension: Secondary | ICD-10-CM | POA: Diagnosis not present

## 2020-12-28 DIAGNOSIS — I890 Lymphedema, not elsewhere classified: Secondary | ICD-10-CM | POA: Diagnosis not present

## 2020-12-28 DIAGNOSIS — G8929 Other chronic pain: Secondary | ICD-10-CM | POA: Diagnosis not present

## 2020-12-28 DIAGNOSIS — G808 Other cerebral palsy: Secondary | ICD-10-CM | POA: Diagnosis not present

## 2020-12-28 DIAGNOSIS — M4800 Spinal stenosis, site unspecified: Secondary | ICD-10-CM | POA: Diagnosis not present

## 2020-12-28 DIAGNOSIS — M8580 Other specified disorders of bone density and structure, unspecified site: Secondary | ICD-10-CM | POA: Diagnosis not present

## 2020-12-28 DIAGNOSIS — S060X0D Concussion without loss of consciousness, subsequent encounter: Secondary | ICD-10-CM | POA: Diagnosis not present

## 2020-12-30 DIAGNOSIS — S060X0D Concussion without loss of consciousness, subsequent encounter: Secondary | ICD-10-CM | POA: Diagnosis not present

## 2020-12-30 DIAGNOSIS — I10 Essential (primary) hypertension: Secondary | ICD-10-CM | POA: Diagnosis not present

## 2020-12-30 DIAGNOSIS — M5410 Radiculopathy, site unspecified: Secondary | ICD-10-CM | POA: Diagnosis not present

## 2020-12-30 DIAGNOSIS — E1136 Type 2 diabetes mellitus with diabetic cataract: Secondary | ICD-10-CM | POA: Diagnosis not present

## 2020-12-30 DIAGNOSIS — G808 Other cerebral palsy: Secondary | ICD-10-CM | POA: Diagnosis not present

## 2020-12-30 DIAGNOSIS — M4800 Spinal stenosis, site unspecified: Secondary | ICD-10-CM | POA: Diagnosis not present

## 2020-12-30 DIAGNOSIS — M8580 Other specified disorders of bone density and structure, unspecified site: Secondary | ICD-10-CM | POA: Diagnosis not present

## 2020-12-30 DIAGNOSIS — G8929 Other chronic pain: Secondary | ICD-10-CM | POA: Diagnosis not present

## 2020-12-30 DIAGNOSIS — I890 Lymphedema, not elsewhere classified: Secondary | ICD-10-CM | POA: Diagnosis not present

## 2021-01-01 DIAGNOSIS — I10 Essential (primary) hypertension: Secondary | ICD-10-CM | POA: Diagnosis not present

## 2021-01-01 DIAGNOSIS — G8929 Other chronic pain: Secondary | ICD-10-CM | POA: Diagnosis not present

## 2021-01-01 DIAGNOSIS — I890 Lymphedema, not elsewhere classified: Secondary | ICD-10-CM | POA: Diagnosis not present

## 2021-01-01 DIAGNOSIS — G808 Other cerebral palsy: Secondary | ICD-10-CM | POA: Diagnosis not present

## 2021-01-01 DIAGNOSIS — M5410 Radiculopathy, site unspecified: Secondary | ICD-10-CM | POA: Diagnosis not present

## 2021-01-01 DIAGNOSIS — S060X0D Concussion without loss of consciousness, subsequent encounter: Secondary | ICD-10-CM | POA: Diagnosis not present

## 2021-01-01 DIAGNOSIS — M8580 Other specified disorders of bone density and structure, unspecified site: Secondary | ICD-10-CM | POA: Diagnosis not present

## 2021-01-01 DIAGNOSIS — M4800 Spinal stenosis, site unspecified: Secondary | ICD-10-CM | POA: Diagnosis not present

## 2021-01-01 DIAGNOSIS — E1136 Type 2 diabetes mellitus with diabetic cataract: Secondary | ICD-10-CM | POA: Diagnosis not present

## 2021-01-03 DIAGNOSIS — M5410 Radiculopathy, site unspecified: Secondary | ICD-10-CM | POA: Diagnosis not present

## 2021-01-03 DIAGNOSIS — E1136 Type 2 diabetes mellitus with diabetic cataract: Secondary | ICD-10-CM | POA: Diagnosis not present

## 2021-01-03 DIAGNOSIS — S060X0D Concussion without loss of consciousness, subsequent encounter: Secondary | ICD-10-CM | POA: Diagnosis not present

## 2021-01-03 DIAGNOSIS — G808 Other cerebral palsy: Secondary | ICD-10-CM | POA: Diagnosis not present

## 2021-01-03 DIAGNOSIS — I10 Essential (primary) hypertension: Secondary | ICD-10-CM | POA: Diagnosis not present

## 2021-01-03 DIAGNOSIS — G8929 Other chronic pain: Secondary | ICD-10-CM | POA: Diagnosis not present

## 2021-01-03 DIAGNOSIS — M4800 Spinal stenosis, site unspecified: Secondary | ICD-10-CM | POA: Diagnosis not present

## 2021-01-03 DIAGNOSIS — M8580 Other specified disorders of bone density and structure, unspecified site: Secondary | ICD-10-CM | POA: Diagnosis not present

## 2021-01-03 DIAGNOSIS — I890 Lymphedema, not elsewhere classified: Secondary | ICD-10-CM | POA: Diagnosis not present

## 2021-01-10 DIAGNOSIS — D509 Iron deficiency anemia, unspecified: Secondary | ICD-10-CM | POA: Diagnosis not present

## 2021-01-10 DIAGNOSIS — I10 Essential (primary) hypertension: Secondary | ICD-10-CM | POA: Diagnosis not present

## 2021-01-10 DIAGNOSIS — E1329 Other specified diabetes mellitus with other diabetic kidney complication: Secondary | ICD-10-CM | POA: Diagnosis not present

## 2021-01-10 DIAGNOSIS — E782 Mixed hyperlipidemia: Secondary | ICD-10-CM | POA: Diagnosis not present

## 2021-01-10 DIAGNOSIS — E7800 Pure hypercholesterolemia, unspecified: Secondary | ICD-10-CM | POA: Diagnosis not present

## 2021-01-10 DIAGNOSIS — E1129 Type 2 diabetes mellitus with other diabetic kidney complication: Secondary | ICD-10-CM | POA: Diagnosis not present

## 2021-01-10 DIAGNOSIS — G8929 Other chronic pain: Secondary | ICD-10-CM | POA: Diagnosis not present

## 2021-01-10 DIAGNOSIS — E1169 Type 2 diabetes mellitus with other specified complication: Secondary | ICD-10-CM | POA: Diagnosis not present

## 2021-01-11 DIAGNOSIS — G8929 Other chronic pain: Secondary | ICD-10-CM | POA: Diagnosis not present

## 2021-01-11 DIAGNOSIS — I890 Lymphedema, not elsewhere classified: Secondary | ICD-10-CM | POA: Diagnosis not present

## 2021-01-11 DIAGNOSIS — G809 Cerebral palsy, unspecified: Secondary | ICD-10-CM | POA: Diagnosis not present

## 2021-01-11 DIAGNOSIS — I359 Nonrheumatic aortic valve disorder, unspecified: Secondary | ICD-10-CM | POA: Diagnosis not present

## 2021-01-18 ENCOUNTER — Other Ambulatory Visit: Payer: Self-pay | Admitting: Cardiovascular Disease

## 2021-01-31 DIAGNOSIS — H401133 Primary open-angle glaucoma, bilateral, severe stage: Secondary | ICD-10-CM | POA: Diagnosis not present

## 2021-01-31 DIAGNOSIS — H16223 Keratoconjunctivitis sicca, not specified as Sjogren's, bilateral: Secondary | ICD-10-CM | POA: Diagnosis not present

## 2021-01-31 DIAGNOSIS — H43811 Vitreous degeneration, right eye: Secondary | ICD-10-CM | POA: Diagnosis not present

## 2021-01-31 DIAGNOSIS — E1139 Type 2 diabetes mellitus with other diabetic ophthalmic complication: Secondary | ICD-10-CM | POA: Diagnosis not present

## 2021-02-09 DIAGNOSIS — I10 Essential (primary) hypertension: Secondary | ICD-10-CM | POA: Diagnosis not present

## 2021-02-09 DIAGNOSIS — M109 Gout, unspecified: Secondary | ICD-10-CM | POA: Diagnosis not present

## 2021-02-09 DIAGNOSIS — E1169 Type 2 diabetes mellitus with other specified complication: Secondary | ICD-10-CM | POA: Diagnosis not present

## 2021-02-09 DIAGNOSIS — E7800 Pure hypercholesterolemia, unspecified: Secondary | ICD-10-CM | POA: Diagnosis not present

## 2021-02-09 DIAGNOSIS — Z1389 Encounter for screening for other disorder: Secondary | ICD-10-CM | POA: Diagnosis not present

## 2021-02-09 DIAGNOSIS — Z0000 Encounter for general adult medical examination without abnormal findings: Secondary | ICD-10-CM | POA: Diagnosis not present

## 2021-02-09 DIAGNOSIS — D649 Anemia, unspecified: Secondary | ICD-10-CM | POA: Diagnosis not present

## 2021-02-09 DIAGNOSIS — Z5181 Encounter for therapeutic drug level monitoring: Secondary | ICD-10-CM | POA: Diagnosis not present

## 2021-02-09 DIAGNOSIS — G809 Cerebral palsy, unspecified: Secondary | ICD-10-CM | POA: Diagnosis not present

## 2021-03-06 ENCOUNTER — Ambulatory Visit: Payer: Medicare PPO | Admitting: Neurology

## 2021-03-06 ENCOUNTER — Encounter: Payer: Self-pay | Admitting: Neurology

## 2021-03-06 ENCOUNTER — Other Ambulatory Visit: Payer: Self-pay

## 2021-03-06 VITALS — BP 126/68 | HR 54

## 2021-03-06 DIAGNOSIS — M5440 Lumbago with sciatica, unspecified side: Secondary | ICD-10-CM

## 2021-03-06 DIAGNOSIS — M79605 Pain in left leg: Secondary | ICD-10-CM

## 2021-03-06 DIAGNOSIS — R296 Repeated falls: Secondary | ICD-10-CM | POA: Diagnosis not present

## 2021-03-06 DIAGNOSIS — G629 Polyneuropathy, unspecified: Secondary | ICD-10-CM | POA: Diagnosis not present

## 2021-03-06 DIAGNOSIS — G40009 Localization-related (focal) (partial) idiopathic epilepsy and epileptic syndromes with seizures of localized onset, not intractable, without status epilepticus: Secondary | ICD-10-CM

## 2021-03-06 NOTE — Patient Instructions (Addendum)
Increase Gabapentin 300mg : Take 2 capsules every night  2. Continue carbamazepine 200mg  three times a day  3. Proceed with MRI lumbar spine without contrast  4. Continue with home PT exercises  5. Follow-up in 6 months, call for any changes   Seizure Precautions: 1. If medication has been prescribed for you to prevent seizures, take it exactly as directed.  Do not stop taking the medicine without talking to your doctor first, even if you have not had a seizure in a long time.   2. Avoid activities in which a seizure would cause danger to yourself or to others.  Don't operate dangerous machinery, swim alone, or climb in high or dangerous places, such as on ladders, roofs, or girders.  Do not drive unless your doctor says you may.  3. If you have any warning that you may have a seizure, lay down in a safe place where you can't hurt yourself.    4.  No driving for 6 months from last seizure, as per [facility].   Please refer to the following link on the [place] website for more information: http://[email or web address]/[email or web address]/drivingu.cfm   5.  Maintain good sleep hygiene.   6.  Contact your doctor if you have any problems that may be related to the medicine you are taking.  7.  Call 911 and bring the patient back to the ED if:        A.  The seizure lasts longer than 5 minutes.       B.  The patient doesn't awaken shortly after the seizure  C.  The patient has new problems such as difficulty seeing, speaking or moving  D.  The patient was injured during the seizure  E.  The patient has a temperature over 102 F (39C)  F.  The patient vomited and now is having trouble breathing

## 2021-03-06 NOTE — Progress Notes (Signed)
NEUROLOGY FOLLOW UP OFFICE NOTE  Donna Becker [id number] 1945-08-19  HISTORY OF PRESENT ILLNESS: I had the pleasure of seeing Donna Becker in follow-up in the neurology clinic on 03/06/2021.  The patient was last seen 9 months ago for seizures and dizziness. She is again accompanied by her sister Donna Becker who helps supplement the history today. Records and images were personally reviewed where available.  Since her last visit, they both report that she has been doing well from a seizure standpoint with no episodes of loss of consciousness in over a year. She is on carbamazepine 200mg  TID without side effects. The dizziness has quieted down. Main concern is the leg pain and weakness, initially her left side was bothering her, physical therapy did help and she continues with HEP but now the right leg is also affected. She ambulates with a walker at home and states that every other week her left leg wants to give way. Last fall was 2 weeks ago when left leg gave way. She had been started on gabapentin 300mg  qhs on her last visit, no side effects, it helps her sleep. She denies any constipation or incontinence, however her sister reports she wears adult diapers. She manages her own medications with pillpacks.   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 We discussed doing a lumbar MRI however this has not been completed.   History on Initial Assessment 10/28/2019: This is a 75 year old left-handed woman with a history of hypertension, hyperlipidemia, diabetes, breast cancer with left arm lymphedema, cerebral palsy, seizures, presenting for evaluation of seizures, dizziness. Records from [facility] were reviewed, her last visit with epileptologist Dr. Opal Sidles was in 2011. She was born with right-sided cerebral palsy and started having seizures as a teenager. 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 She also had episodes of staring/confusion lasting up to 15-20 minutes. She was previously on phenobarbital and nitrofurantoin, then has been on Tegretol for many years. In March 2011, she had an episode of confusion and odd behavior followed by prolonged period of sleepiness. MRI/MRA brain no acute changes. She did not tolerate increasing dose of Tegretol, at that time Levetiracetam 500mg  BID was added. On her visit in 2011, she reported frequent falls, due to left knee pain, she would fall because her right leg cannot support her. She states the doctor at [facility] gave her a white pill and the pain in her left leg was better. She reports the vessels in her left leg pops up, she has compression stockings which are uncomfortable. She is not taking the unrecalled medication since she moved to [place] more than 10 years ago. Gabapentin is on her medication list on [facility], however she does not think she is taking it. It is not on the medication list from Dr. Lina Sar last office visit note. She denies any seizures for many, many years. She manages her own medications and states she takes them religiously. She lives with her brother who has not mentioned any staring/unresponsive episodes. She denies any gaps in time, olfactory/gustatory hallucinations, myoclonic jerks. She takes carbamazepine 200mg  1 tab in AM, 2 tabs in PM without side effects.  In April, her sister called Dr. Delfina Redwood to report sudden onset fainting episodes at home. She fainted and fell suddenly while walking at home, then had another event 2 weeks later. She is alone in the office today  and denies any episodes of loss of consciousness. Notes from Dr. Delfina Redwood were reviewed, she was reporting feeling weak, dizzy, like the room is dizzy. Symptoms typically occur when changing position. Orthostatic vital signs were negative at her visit. Head CT was ordered but she did not do the test. She states  the dizziness is not as much as it was. She has to be very careful with how she moves, she would be in bed and try to move then start spinning. No nausea/vomiting, this does not occur daily. She has had falls but is unsure if they are associated with the dizziness. She reports frequent falls, last fall was last Monday, she had her cane when she fell. She has to be very careful, before she knows it, she is on the floor, no loss of consciousness. She reports her left leg is so painful. Due to her right sided CP, she has always depended on the left side, but now it is "not my side anymore." She was previously taking aspirin for the pain but stopped since ineffective. She has left-sided back pain and neck pain down her left shoulder. No headaches, diplopia, dysarthria/dysphagia. She reports her last HbA1c was okay. Her last PT session was 2 years ago where she was given exercises for left leg pain, she continues to do them.   MRI lumbar spine in 2012 showed degenerative anterolisthesis and uncovering of the disc at L4-5 and L5-S1; Right greater than left lateral recess narrowing at L4-5; Mild lateral recess narrowing bilaterally at L5-S1; Prominent epidural fat at L4-5 and L5-S1 compatible with epidural lipomatosis; Mild facet hypertrophy L3-4 without significant stenosis.   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 There is no history of febrile convulsions, CNS infections such as meningitis/encephalitis, significant traumatic brain injury, neurosurgical procedures, or family history of seizures.  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 No acute change.   PAST MEDICAL HISTORY: Past Medical History:  Diagnosis Date   Arthritis    Cancer ([place])    left breast cancer /with  retained lymph edema left arm   Cerebral palsy, hemiplegic (HCC)    right sided weakness- ambulates with walker   Diabetes mellitus without complication (HCC)    Glaucoma    Gout    History of shingles    Hypertension    Neuromuscular disorder ([facility])    neuropathy, right sided paresis, left arm numbness   Seizure (H[facility])    Seizures ([place])    Transfusion history    '90's when had breast cancer surgery    MEDICATIONS: Current Outpatient Medications on File Prior to Visit  Medication Sig Dispense Refill   allopurinol (ZYLOPRIM) 100 MG tablet Take 100 mg by mouth daily.     B Complex-C (B-COMPLEX WITH VITAMIN C) tablet Take 1 tablet by mouth daily.     bimatoprost (LUMIGAN) 0.01 % SOLN Place 1 drop into both eyes at bedtime.     carbamazepine (TEGRETOL) 200 MG tablet Take 200 mg by mouth 3 (three) times daily.     carvedilol (COREG) 25 MG tablet TAKE ONE TABLET BY MOUTH EVERY MORNING and TAKE ONE TABLET BY MOUTH EVERY EVENING 60 tablet 5   colchicine 0.6 MG tablet Take 0.6 mg by mouth daily.     cycloSPORINE, PF, (CEQUA) 0.09 % SOLN Place 1 drop into  both eyes every morning.     fexofenadine (ALLEGRA) 180 MG tablet Take 180 mg by mouth daily.     furosemide (LASIX) 20 MG tablet Take 20 mg by mouth every morning.     gabapentin (NEURONTIN) 300 MG capsule Take 1 capsule (300 mg total) by mouth at bedtime. 30 capsule 11   glipiZIDE (GLUCOTROL XL) 10 MG 24 hr tablet Take 10 mg by mouth daily.     hydrALAZINE (APRESOLINE) 100 MG tablet Take 100 mg by mouth 2 (two) times daily.     losartan (COZAAR) 100 MG tablet Take 100 mg by mouth every evening.     metFORMIN (GLUCOPHAGE) 500 MG tablet Take 500 mg by mouth 2 (two) times daily with a meal.     Multiple Vitamin (MULTIVITAMIN WITH MINERALS) TABS tablet Take 1 tablet by mouth daily.     NIFEdipine (ADALAT CC) 90 MG 24 hr tablet TAKE ONE TABLET BY MOUTH ONCE DAILY 90 tablet 1   pravastatin (PRAVACHOL) 40 MG tablet Take 40 mg by mouth every  evening.     Probiotic Product (PROBIOTIC DAILY) CAPS Take 1 capsule by mouth daily.     No current facility-administered medications on file prior to visit.    ALLERGIES: Allergies  Allergen Reactions   Codeine Itching and Rash    FAMILY HISTORY: Family History  Problem Relation Age of Onset   Hypertension Mother    Arthritis Sister     SOCIAL HISTORY: Social History   Socioeconomic History   Marital status: Single    Spouse name: Not on file   Number of children: Not on file   Years of education: Not on file   Highest education level: Not on file  Occupational History   Occupation: retired Licensed conveyancer  Tobacco Use   Smoking status: Former    Years: 1.00    Types: Cigarettes    Quit date: 01/06/1969    Years since quitting: 52.1   Smokeless tobacco: Never  Vaping Use   Vaping Use: Never used  Substance and Sexual Activity   Alcohol use: No   Drug use: No   Sexual activity: Not Currently  Other Topics Concern   Not on file  Social History Narrative   Left Handed   One Story Home   No caffeine   Social Determinants of Health   Financial Resource Strain: Not on file  Food Insecurity: Not on file  Transportation Needs: Not on file  Physical Activity: Not on file  Stress: Not on file  Social Connections: Not on file  Intimate Partner Violence: Not on file     PHYSICAL EXAM: Vitals:   03/06/21 1456  BP: 126/68  Pulse: (!) 54  SpO2: 96%   General: No acute distress Head:  Normocephalic/atraumatic Skin/Extremities: +left arm lymphedema (chronic) Neurological Exam: alert and awake. No aphasia or dysarthria. Fund of knowledge is appropriate.  Attention and concentration are normal.   Cranial nerves: Pupils equal, round. Extraocular movements intact with no nystagmus. Visual fields full.  No facial asymmetry.  Motor: increased tone on right UE and LE, 5/5 proximal right UE, 3/5 distal right LE with difficulty extending 4th and 5th digits (chronic), 3/5  right hip flexion, 4/5 left hip flexion (reporting back pain), 5/5 bilateral knee extension. Finger to nose testing intact.  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 She and her sister continue to deny any seizures on carbamazepine 200mg  TID. Main concern today is leg pain/weakness, initially left but now also affecting the right leg. EMG noted the possibility of a superimposed lumbosacral radiculopathy at L3-5, proceed with MRI lumbar spine as previously discussed. Increase gabapentin to 600mg  qhs. Continue home PT exercises. Dizziness has quieted down. She does not drive. Follow-up in 6 months, call for any changes.   Thank you for allowing me to participate in her care.  Please do not hesitate to call for any questions or concerns.    Ellouise Newer, M.D.   CC: Dr. Delfina Redwood

## 2021-03-09 ENCOUNTER — Other Ambulatory Visit: Payer: Self-pay

## 2021-03-09 ENCOUNTER — Telehealth: Payer: Self-pay | Admitting: Neurology

## 2021-03-09 MED ORDER — GABAPENTIN 300 MG PO CAPS: 600.0000 mg | ORAL_CAPSULE | Freq: Every day | ORAL | 11 refills | Status: DC

## 2021-03-09 MED ORDER — GABAPENTIN 300 MG PO CAPS: 600.0000 mg | ORAL_CAPSULE | Freq: Every day | ORAL | 5 refills | Status: DC

## 2021-03-09 NOTE — Telephone Encounter (Signed)
Patient called to let aquino know her insurance called and let her know they approved her for an MRI

## 2021-03-09 NOTE — Telephone Encounter (Signed)
1. Which medications need refilled? (List name and dosage, if known) gabapentin increase  2. Which pharmacy/location is medication to be sent to? (include street and city if local pharmacy) [facility], [address]  3. Do they need a 30 day or 90 day supply? One month  Patient saw Dr. Delice Lesch this week and said the pharmacy does not have her new prescription from this week.

## 2021-03-09 NOTE — Telephone Encounter (Signed)
Pt called no answer left a voice mail to call the office back so we can give her the number to [facility] [phone] so she can get scheduled

## 2021-03-09 NOTE — Addendum Note (Signed)
Addended by: Cameron Sprang on: 03/09/2021 01:03 PM   Modules accepted: Orders

## 2021-03-09 NOTE — Telephone Encounter (Signed)
Updated RX sent for pt

## 2021-03-09 NOTE — Telephone Encounter (Signed)
Pt called and given number to [facility] imaging

## 2021-03-19 ENCOUNTER — Ambulatory Visit
Admission: RE | Admit: 2021-03-19 | Discharge: 2021-03-19 | Disposition: A | Discharge status: Home / Self Care | Payer: Medicare PPO | Source: Ambulatory Visit | Attending: Neurology | Admitting: Neurology

## 2021-03-19 DIAGNOSIS — G629 Polyneuropathy, unspecified: Secondary | ICD-10-CM

## 2021-03-19 DIAGNOSIS — R296 Repeated falls: Secondary | ICD-10-CM

## 2021-03-19 DIAGNOSIS — G40009 Localization-related (focal) (partial) idiopathic epilepsy and epileptic syndromes with seizures of localized onset, not intractable, without status epilepticus: Secondary | ICD-10-CM

## 2021-03-19 DIAGNOSIS — M545 Low back pain, unspecified: Secondary | ICD-10-CM | POA: Diagnosis not present

## 2021-03-19 DIAGNOSIS — M4807 Spinal stenosis, lumbosacral region: Secondary | ICD-10-CM | POA: Diagnosis not present

## 2021-03-19 DIAGNOSIS — M79605 Pain in left leg: Secondary | ICD-10-CM

## 2021-03-19 DIAGNOSIS — M5440 Lumbago with sciatica, unspecified side: Secondary | ICD-10-CM

## 2021-03-19 DIAGNOSIS — M4316 Spondylolisthesis, lumbar region: Secondary | ICD-10-CM | POA: Diagnosis not present

## 2021-03-19 DIAGNOSIS — M2578 Osteophyte, vertebrae: Secondary | ICD-10-CM | POA: Diagnosis not present

## 2021-03-19 DIAGNOSIS — M48061 Spinal stenosis, lumbar region without neurogenic claudication: Secondary | ICD-10-CM | POA: Diagnosis not present

## 2021-03-19 MED ORDER — CARBAMAZEPINE 200 MG PO TABS: 200.0000 mg | ORAL_TABLET | Freq: Three times a day (TID) | ORAL | 3 refills | Status: DC

## 2021-03-22 ENCOUNTER — Telehealth: Payer: Self-pay

## 2021-03-22 DIAGNOSIS — M4800 Spinal stenosis, site unspecified: Secondary | ICD-10-CM

## 2021-03-22 NOTE — Telephone Encounter (Signed)
Pt called informed that lumbar MRI showed severe spinal stenosis, would like to refer to Neurosurgery for evaluation referral placed in epic

## 2021-03-22 NOTE — Telephone Encounter (Signed)
-----   Message from Cameron Sprang, MD sent at 03/22/2021  9:55 AM EST ----- Pls let her know the lumbar MRI showed severe spinal stenosis, would like to refer to Neurosurgery for evaluation. Thanks

## 2021-04-23 ENCOUNTER — Other Ambulatory Visit: Payer: Self-pay

## 2021-04-23 ENCOUNTER — Encounter: Payer: Self-pay | Admitting: Podiatry

## 2021-04-23 ENCOUNTER — Ambulatory Visit: Payer: Medicare PPO | Admitting: Podiatry

## 2021-04-23 DIAGNOSIS — B351 Tinea unguium: Secondary | ICD-10-CM | POA: Diagnosis not present

## 2021-04-23 DIAGNOSIS — M79674 Pain in right toe(s): Secondary | ICD-10-CM

## 2021-04-23 DIAGNOSIS — M79675 Pain in left toe(s): Secondary | ICD-10-CM

## 2021-04-23 NOTE — Progress Notes (Signed)
This patient presents  to my office for at risk foot care.  This patient requires this care by a professional since this patient will be at risk due to having diabetes with no complications. This patient is unable to cut nails herself since the patient cannot reach her nails.These nails are painful walking and wearing shoes.  This patient presents for at risk foot care today.  General Appearance  Alert, conversant and in no acute stress.  Vascular  Dorsalis pedis and posterior tibial  pulses are palpable  bilaterally.  Capillary return is within normal limits  bilaterally. Temperature is within normal limits  bilaterally.  Neurologic  Senn-Weinstein monofilament wire test within normal limits  bilaterally. Muscle power within normal limits bilaterally.  Nails Thick disfigured discolored nails with subungual debris  from hallux to fifth toes bilaterally. No evidence of bacterial infection or drainage bilaterally.  Orthopedic  No limitations of motion  feet .  No crepitus or effusions noted.  No bony pathology or digital deformities noted.  Skin  normotropic skin with no porokeratosis noted bilaterally.  No signs of infections or ulcers noted.   Clavi 4th toe left foot asymptomatic.  Onychomycosis  Pain in right toes  Pain in left toes    Consent was obtained for treatment procedures.   Mechanical debridement of nails 1-5  bilaterally performed with a nail nipper.  Filed with dremel without incident.    Return office visit  3 months                    Told patient to return for periodic foot care and evaluation due to potential at risk complications.   Moselle Rister DPM   

## 2021-05-02 DIAGNOSIS — E1139 Type 2 diabetes mellitus with other diabetic ophthalmic complication: Secondary | ICD-10-CM | POA: Diagnosis not present

## 2021-05-02 DIAGNOSIS — H16223 Keratoconjunctivitis sicca, not specified as Sjogren's, bilateral: Secondary | ICD-10-CM | POA: Diagnosis not present

## 2021-05-02 DIAGNOSIS — H43811 Vitreous degeneration, right eye: Secondary | ICD-10-CM | POA: Diagnosis not present

## 2021-05-02 DIAGNOSIS — H401133 Primary open-angle glaucoma, bilateral, severe stage: Secondary | ICD-10-CM | POA: Diagnosis not present

## 2021-05-15 DIAGNOSIS — E119 Type 2 diabetes mellitus without complications: Secondary | ICD-10-CM | POA: Diagnosis not present

## 2021-06-24 ENCOUNTER — Other Ambulatory Visit: Payer: Self-pay | Admitting: Cardiovascular Disease

## 2021-06-25 NOTE — Telephone Encounter (Signed)
As per pharmacy This prescription was filled on 06/04/2021 ?

## 2021-06-26 ENCOUNTER — Other Ambulatory Visit: Payer: Self-pay | Admitting: Cardiovascular Disease

## 2021-07-05 ENCOUNTER — Encounter (HOSPITAL_COMMUNITY): Payer: Self-pay | Admitting: Cardiovascular Disease

## 2021-07-16 ENCOUNTER — Other Ambulatory Visit: Payer: Self-pay | Admitting: Cardiovascular Disease

## 2021-07-23 ENCOUNTER — Encounter: Payer: Self-pay | Admitting: Podiatry

## 2021-07-23 ENCOUNTER — Ambulatory Visit: Payer: Medicare PPO | Admitting: Podiatry

## 2021-07-23 DIAGNOSIS — M79674 Pain in right toe(s): Secondary | ICD-10-CM | POA: Diagnosis not present

## 2021-07-23 DIAGNOSIS — M79675 Pain in left toe(s): Secondary | ICD-10-CM | POA: Diagnosis not present

## 2021-07-23 DIAGNOSIS — B351 Tinea unguium: Secondary | ICD-10-CM

## 2021-07-23 NOTE — Progress Notes (Signed)
This patient presents  to my office for at risk foot care.  This patient requires this care by a professional since this patient will be at risk due to having diabetes with no complications. This patient is unable to cut nails herself since the patient cannot reach her nails.These nails are painful walking and wearing shoes.  This patient presents for at risk foot care today.  General Appearance  Alert, conversant and in no acute stress.  Vascular  Dorsalis pedis and posterior tibial  pulses are palpable  bilaterally.  Capillary return is within normal limits  bilaterally. Temperature is within normal limits  bilaterally.  Neurologic  Senn-Weinstein monofilament wire test within normal limits  bilaterally. Muscle power within normal limits bilaterally.  Nails Thick disfigured discolored nails with subungual debris  from hallux to fifth toes bilaterally. No evidence of bacterial infection or drainage bilaterally.  Orthopedic  No limitations of motion  feet .  No crepitus or effusions noted.  No bony pathology or digital deformities noted.  Skin  normotropic skin with no porokeratosis noted bilaterally.  No signs of infections or ulcers noted.   Clavi 4th toe left foot asymptomatic.  Onychomycosis  Pain in right toes  Pain in left toes    Consent was obtained for treatment procedures.   Mechanical debridement of nails 1-5  bilaterally performed with a nail nipper.  Filed with dremel without incident.    Return office visit  3 months                    Told patient to return for periodic foot care and evaluation due to potential at risk complications.   Dlisa Barnwell DPM   

## 2021-08-10 DIAGNOSIS — I350 Nonrheumatic aortic (valve) stenosis: Secondary | ICD-10-CM | POA: Diagnosis not present

## 2021-08-10 DIAGNOSIS — R269 Unspecified abnormalities of gait and mobility: Secondary | ICD-10-CM | POA: Diagnosis not present

## 2021-08-10 DIAGNOSIS — I890 Lymphedema, not elsewhere classified: Secondary | ICD-10-CM | POA: Diagnosis not present

## 2021-08-10 DIAGNOSIS — G809 Cerebral palsy, unspecified: Secondary | ICD-10-CM | POA: Diagnosis not present

## 2021-08-10 DIAGNOSIS — E7800 Pure hypercholesterolemia, unspecified: Secondary | ICD-10-CM | POA: Diagnosis not present

## 2021-08-10 DIAGNOSIS — E1169 Type 2 diabetes mellitus with other specified complication: Secondary | ICD-10-CM | POA: Diagnosis not present

## 2021-08-10 DIAGNOSIS — I428 Other cardiomyopathies: Secondary | ICD-10-CM | POA: Diagnosis not present

## 2021-08-10 DIAGNOSIS — I10 Essential (primary) hypertension: Secondary | ICD-10-CM | POA: Diagnosis not present

## 2021-08-17 ENCOUNTER — Ambulatory Visit (HOSPITAL_COMMUNITY): Payer: Medicare PPO | Attending: Cardiovascular Disease

## 2021-08-17 DIAGNOSIS — I350 Nonrheumatic aortic (valve) stenosis: Secondary | ICD-10-CM | POA: Diagnosis not present

## 2021-08-17 LAB — ECHOCARDIOGRAM COMPLETE
AR max vel: 1.12 cm2
AV Area VTI: 1.12 cm2
AV Area mean vel: 1.18 cm2
AV Mean grad: 23 mmHg
AV Peak grad: 42.3 mmHg
Ao pk vel: 3.25 m/s
Area-P 1/2: 3.2 cm2
S' Lateral: 2.5 cm

## 2021-08-24 ENCOUNTER — Telehealth: Payer: Self-pay | Admitting: Cardiovascular Disease

## 2021-08-24 NOTE — Telephone Encounter (Signed)
Patient returning call to discuss echo

## 2021-08-28 NOTE — Telephone Encounter (Signed)
Called patient, gave results.  Patient verbalized understanding.  She is overdue for follow up for 1 year. I did schedule for follow up on 06/22 with Dr.O'Neal.   Patient and sister verbalized understanding.

## 2021-09-05 DIAGNOSIS — H43811 Vitreous degeneration, right eye: Secondary | ICD-10-CM | POA: Diagnosis not present

## 2021-09-05 DIAGNOSIS — E1139 Type 2 diabetes mellitus with other diabetic ophthalmic complication: Secondary | ICD-10-CM | POA: Diagnosis not present

## 2021-09-05 DIAGNOSIS — H35033 Hypertensive retinopathy, bilateral: Secondary | ICD-10-CM | POA: Diagnosis not present

## 2021-09-05 DIAGNOSIS — H401133 Primary open-angle glaucoma, bilateral, severe stage: Secondary | ICD-10-CM | POA: Diagnosis not present

## 2021-09-05 DIAGNOSIS — H16223 Keratoconjunctivitis sicca, not specified as Sjogren's, bilateral: Secondary | ICD-10-CM | POA: Diagnosis not present

## 2021-09-10 NOTE — Progress Notes (Signed)
Cardiology Office Note:   Date:  09/13/2021  NAME:  Donna Becker    MRN: [id number] DOB:  12-27-1945   PCP:  Seward Carol, MD  Cardiologist:  Evalina Field, MD  Electrophysiologist:  None   Referring MD: Seward Carol, MD   Chief Complaint  Patient presents with   Follow-up        History of Present Illness:   Donna Becker is a 76 y.o. female with a hx of moderate aortic stenosis, hypertension who presents for follow-up.  Most recent echo shows aortic stenosis is moderate.  She reports she is doing well.  She presents with her sister.  Denies any chest pain or trouble breathing.  Blood pressure slightly elevated but seems to be better controlled at home.  Aortic stenosis now moderate.  Murmur also consistent with this diagnosis.  She is without complaints of chest pain or trouble breathing.  She does have some lower extremity edema that is improved with Lasix.  She is not active.  She reports he mainly sits around the house.  Suspect this is due to inactivity.  Overall no overt signs of clinical heart failure.  She seems to be doing well.  We discussed her heart valve today.   Problem List 1. Diabetes -A1c 5.8 -T chol 180, HDL 58, LDL 95, TG 134 2. HTN 3. LBBB -EF 50-55% 4. Breast CA -lymphedema  5. Mod AS -V max 3.2 m/s, MG 23 mmHG, AVA 1.12 cm2, DI 0.36  Past Medical History: Past Medical History:  Diagnosis Date   Arthritis    Cancer ([place])    left breast cancer /with retained lymph edema left arm   Cerebral palsy, hemiplegic (HCC)    right sided weakness- ambulates with walker   Diabetes mellitus without complication (HCC)    Glaucoma    Gout    History of shingles    Hypertension    Neuromuscular disorder (HCC)    neuropathy, right sided paresis, left arm numbness   Seizure ([place])    Seizures ([place])    Transfusion history    '90's when had breast cancer surgery    Past Surgical History: Past Surgical History:  Procedure Laterality Date   BIOPSY   09/29/2019   Procedure: BIOPSY;  Surgeon: Wilford Corner, MD;  Location: [facility];  Service: Endoscopy;;   BREAST SURGERY Left    s/p left mastectomy   CHOLECYSTECTOMY     open    COLONOSCOPY WITH PROPOFOL N/A 09/29/2019   Procedure: COLONOSCOPY WITH PROPOFOL;  Surgeon: Wilford Corner, MD;  Location: [facility];  Service: Endoscopy;  Laterality: N/A;   ESOPHAGOGASTRODUODENOSCOPY (EGD) WITH PROPOFOL N/A 09/29/2019   Procedure: ESOPHAGOGASTRODUODENOSCOPY (EGD) WITH PROPOFOL;  Surgeon: Wilford Corner, MD;  Location: [facility];  Service: Endoscopy;  Laterality: N/A;   EYE SURGERY Right    corneal transplant   FLEXIBLE SIGMOIDOSCOPY N/A 01/26/2013   Procedure: FLEXIBLE SIGMOIDOSCOPY;  Surgeon: Garlan Fair, MD;  Location: [facility];  Service: Endoscopy;  Laterality: N/A;  no sedation   POLYPECTOMY  09/29/2019   Procedure: POLYPECTOMY;  Surgeon: Wilford Corner, MD;  Location: [facility];  Service: Endoscopy;;   port-a-cath     implantation and removal   TUBAL LIGATION      Current Medications: Current Meds  Medication Sig   allopurinol (ZYLOPRIM) 100 MG tablet Take 100 mg by mouth daily.   B Complex-C (B-COMPLEX WITH VITAMIN C) tablet Take 1 tablet by mouth daily.   bimatoprost (LUMIGAN) 0.01 %  SOLN Place 1 drop into both eyes at bedtime.   carbamazepine (TEGRETOL) 200 MG tablet Take 1 tablet (200 mg total) by mouth 3 (three) times daily.   carvedilol (COREG) 25 MG tablet TAKE ONE TABLET BY MOUTH EVERY MORNING and TAKE ONE TABLET BY MOUTH EVERY EVENING   colchicine 0.6 MG tablet Take 0.6 mg by mouth daily.   cycloSPORINE, PF, (CEQUA) 0.09 % SOLN Place 1 drop into both eyes every morning.   fexofenadine (ALLEGRA) 180 MG tablet Take 180 mg by mouth daily.   furosemide (LASIX) 20 MG tablet Take 20 mg by mouth every morning.   gabapentin (NEURONTIN) 300 MG capsule Take 2 capsules (600 mg total) by mouth at bedtime.   glipiZIDE (GLUCOTROL XL) 10 MG 24 hr tablet Take 10 mg  by mouth daily.   hydrALAZINE (APRESOLINE) 100 MG tablet Take 100 mg by mouth 2 (two) times daily.   losartan (COZAAR) 100 MG tablet Take 100 mg by mouth every evening.   metFORMIN (GLUCOPHAGE) 500 MG tablet Take 500 mg by mouth 2 (two) times daily with a meal.   Multiple Vitamin (MULTIVITAMIN WITH MINERALS) TABS tablet Take 1 tablet by mouth daily.   NIFEdipine (ADALAT CC) 90 MG 24 hr tablet TAKE ONE TABLET BY MOUTH ONCE DAILY   pravastatin (PRAVACHOL) 40 MG tablet Take 40 mg by mouth every evening.   Probiotic Product (PROBIOTIC DAILY) CAPS Take 1 capsule by mouth daily.     Allergies:    Codeine   Social History: Social History   Socioeconomic History   Marital status: Single    Spouse name: Not on file   Number of children: Not on file   Years of education: Not on file   Highest education level: Not on file  Occupational History   Occupation: retired Licensed conveyancer  Tobacco Use   Smoking status: Former    Years: 1.00    Types: Cigarettes    Quit date: 01/06/1969    Years since quitting: 52.7   Smokeless tobacco: Never  Vaping Use   Vaping Use: Never used  Substance and Sexual Activity   Alcohol use: No   Drug use: No   Sexual activity: Not Currently  Other Topics Concern   Not on file  Social History Narrative   Left Handed   One Story Home   No caffeine   Social Determinants of Health   Financial Resource Strain: Not on file  Food Insecurity: Not on file  Transportation Needs: Not on file  Physical Activity: Not on file  Stress: Not on file  Social Connections: Not on file     Family History: The patient's family history includes Arthritis in her sister; Hypertension in her mother.  ROS:   All other ROS reviewed and negative. Pertinent positives noted in the HPI.     EKGs/Labs/Other Studies Reviewed:   The following studies were personally reviewed by me today:  TTE 08/17/2021  1. The aortic valve is tricuspid. There is moderate calcification of the   aortic valve. There is moderate thickening of the aortic valve. Aortic  valve regurgitation is not visualized. Moderate aortic valve stenosis.  Aortic valve area, by VTI measures  1.12 cm. Aortic valve mean gradient measures 23.0 mmHg. Aortic valve Vmax  measures 3.25 m/s.   2. Left ventricular ejection fraction, by estimation, is 60 to 65%. The  left ventricle has normal function. The left ventricle has no regional  wall motion abnormalities. There is mild concentric left ventricular  hypertrophy.  Left ventricular diastolic  parameters are consistent with Grade II diastolic dysfunction  (pseudonormalization).   3. Right ventricular systolic function is normal. The right ventricular  size is normal. There is mildly elevated pulmonary artery systolic  pressure. The estimated right ventricular systolic pressure is 09.7 mmHg.   4. Left atrial size was severely dilated.   5. Right atrial size was mildly dilated.   6. The mitral valve is grossly normal. Mild mitral valve regurgitation.  No evidence of mitral stenosis.   7. Tricuspid valve regurgitation is mild to moderate.   8. The inferior vena cava is normal in size with greater than 50%  respiratory variability, suggesting right atrial pressure of 3 mmHg.   Recent Labs: No results found for requested labs within last 365 days.   Recent Lipid Panel    Component Value Date/Time   CHOL 150 12/28/2010 0620   TRIG 97 12/28/2010 0620   HDL 47 12/28/2010 0620   CHOLHDL 3.2 12/28/2010 0620   VLDL 19 12/28/2010 0620   LDLCALC 84 12/28/2010 0620    Physical Exam:   VS:  BP (!) 147/64   Pulse 68   Ht '5\' 5"'$  (1.651 m)   Wt 140 lb (63.5 kg)   SpO2 96%   BMI 23.30 kg/m    Wt Readings from Last 3 Encounters:  09/13/21 140 lb (63.5 kg)  08/17/20 166 lb (75.3 kg)  06/02/20 150 lb (68 kg)    General: Well nourished, well developed, in no acute distress Head: Atraumatic, normal size  Eyes: PEERLA, EOMI  Neck: Supple, no  JVD Endocrine: No thryomegaly Cardiac: Normal S1, S2; RRR; 3 out of 6 systolic ejection murmur Lungs: Clear to auscultation bilaterally, no wheezing, rhonchi or rales  Abd: Soft, nontender, no hepatomegaly  Ext: No edema, pulses 2+ Musculoskeletal: No deformities, BUE and BLE strength normal and equal Skin: Warm and dry, no rashes   Neuro: Alert and oriented to person, place, time, and situation, CNII-XII grossly intact, no focal deficits  Psych: Normal mood and affect   ASSESSMENT:   SHEVA MCDOUGLE is a 76 y.o. female who presents for the following: 1. Nonrheumatic aortic valve stenosis   2. Primary hypertension   3. LBBB (left bundle branch block)   4. Mixed hyperlipidemia     PLAN:   1. Nonrheumatic aortic valve stenosis -Moderate aortic stenosis.  We will follow this yearly.  She will see me back in 1 year with repeat echocardiogram.  2. Primary hypertension -No change to medications.  3. LBBB (left bundle branch block) -EF 50-55%.  4. Mixed hyperlipidemia -History of diabetes in the past.  A1c much improved.  Continue statin.  Disposition: Return in about 1 year (around 09/14/2022).  Medication Adjustments/Labs and Tests Ordered: Current medicines are reviewed at length with the patient today.  Concerns regarding medicines are outlined above.  Orders Placed This Encounter  Procedures   ECHOCARDIOGRAM COMPLETE   No orders of the defined types were placed in this encounter.   Patient Instructions  Medication Instructions:  The current medical regimen is effective;  continue present plan and medications.  *If you need a refill on your cardiac medications before your next appointment, please call your pharmacy*     Testing/Procedures: Echocardiogram (12 month) - Your physician has requested that you have an echocardiogram. Echocardiography is a painless test that uses sound waves to create images of your heart. It provides your doctor with information about the  size and shape of your heart  and how well your heart's chambers and valves are working. This procedure takes approximately one hour. There are no restrictions for this procedure.     Follow-Up: At [facility] you and your health needs are our priority.  As part of our continuing mission to provide you with exceptional heart care, we have created designated Provider Care Teams.  These Care Teams include your primary Cardiologist (physician) and Advanced Practice Providers (APPs -  Physician Assistants and Nurse Practitioners) who all work together to provide you with the care you need, when you need it.  We recommend signing up for the patient portal called "MyChart".  Sign up information is provided on this After Visit Summary.  MyChart is used to connect with patients for Virtual Visits (Telemedicine).  Patients are able to view lab/test results, encounter notes, upcoming appointments, etc.  Non-urgent messages can be sent to your provider as well.   To learn more about what you can do with MyChart, go to [email or web address].    Your next appointment:   12 month(s)  The format for your next appointment:   In Person  Provider:   Evalina Field, MD {          Time Spent with Patient: I have spent a total of 25 minutes with patient reviewing hospital notes, telemetry, EKGs, labs and examining the patient as well as establishing an assessment and plan that was discussed with the patient.  > 50% of time was spent in direct patient care.  Signed, Addison Naegeli. Audie Box, MD, [place]  [address], [place], [place] [address] [phone]  09/13/2021 2:36 PM

## 2021-09-13 ENCOUNTER — Encounter: Payer: Self-pay | Admitting: Cardiovascular Disease

## 2021-09-13 ENCOUNTER — Ambulatory Visit: Payer: Medicare PPO | Admitting: Cardiovascular Disease

## 2021-09-13 VITALS — BP 147/64 | HR 68 | Ht 65.0 in | Wt 140.0 lb

## 2021-09-13 DIAGNOSIS — E782 Mixed hyperlipidemia: Secondary | ICD-10-CM

## 2021-09-13 DIAGNOSIS — I350 Nonrheumatic aortic (valve) stenosis: Secondary | ICD-10-CM

## 2021-09-13 DIAGNOSIS — I447 Left bundle-branch block, unspecified: Secondary | ICD-10-CM | POA: Diagnosis not present

## 2021-09-13 DIAGNOSIS — I10 Essential (primary) hypertension: Secondary | ICD-10-CM

## 2021-09-13 NOTE — Patient Instructions (Signed)
Medication Instructions:  The current medical regimen is effective;  continue present plan and medications.  *If you need a refill on your cardiac medications before your next appointment, please call your pharmacy*     Testing/Procedures: Echocardiogram (12 month) - Your physician has requested that you have an echocardiogram. Echocardiography is a painless test that uses sound waves to create images of your heart. It provides your doctor with information about the size and shape of your heart and how well your heart's chambers and valves are working. This procedure takes approximately one hour. There are no restrictions for this procedure.     Follow-Up: At [facility], you and your health needs are our priority.  As part of our continuing mission to provide you with exceptional heart care, we have created designated Provider Care Teams.  These Care Teams include your primary Cardiologist (physician) and Advanced Practice Providers (APPs -  Physician Assistants and Nurse Practitioners) who all work together to provide you with the care you need, when you need it.  We recommend signing up for the patient portal called "MyChart".  Sign up information is provided on this After Visit Summary.  MyChart is used to connect with patients for Virtual Visits (Telemedicine).  Patients are able to view lab/test results, encounter notes, upcoming appointments, etc.  Non-urgent messages can be sent to your provider as well.   To learn more about what you can do with MyChart, go to [email or web address].    Your next appointment:   12 month(s)  The format for your next appointment:   In Person  Provider:   Evalina Field, MD {

## 2021-09-14 ENCOUNTER — Ambulatory Visit: Payer: Medicare PPO | Admitting: Neurology

## 2021-09-14 ENCOUNTER — Encounter: Payer: Self-pay | Admitting: Neurology

## 2021-09-14 VITALS — BP 190/92 | HR 70 | Ht 65.0 in | Wt 140.0 lb

## 2021-09-14 DIAGNOSIS — M48061 Spinal stenosis, lumbar region without neurogenic claudication: Secondary | ICD-10-CM

## 2021-09-14 DIAGNOSIS — G40009 Localization-related (focal) (partial) idiopathic epilepsy and epileptic syndromes with seizures of localized onset, not intractable, without status epilepticus: Secondary | ICD-10-CM

## 2021-09-14 DIAGNOSIS — R29898 Other symptoms and signs involving the musculoskeletal system: Secondary | ICD-10-CM

## 2021-09-14 MED ORDER — CARBAMAZEPINE 200 MG PO TABS
200.0000 mg | ORAL_TABLET | Freq: Three times a day (TID) | ORAL | 3 refills | Status: DC
Start: 2021-09-14 — End: 2022-04-10

## 2021-09-14 MED ORDER — GABAPENTIN 300 MG PO CAPS: ORAL_CAPSULE | ORAL | 3 refills | Status: DC

## 2021-10-15 ENCOUNTER — Other Ambulatory Visit: Payer: Self-pay | Admitting: Cardiovascular Disease

## 2021-10-24 ENCOUNTER — Encounter: Payer: Self-pay | Admitting: Podiatry

## 2021-10-24 ENCOUNTER — Ambulatory Visit (INDEPENDENT_AMBULATORY_CARE_PROVIDER_SITE_OTHER): Payer: Medicare PPO | Admitting: Podiatry

## 2021-10-24 DIAGNOSIS — B351 Tinea unguium: Secondary | ICD-10-CM | POA: Diagnosis not present

## 2021-10-24 DIAGNOSIS — M79674 Pain in right toe(s): Secondary | ICD-10-CM

## 2021-10-24 DIAGNOSIS — M79675 Pain in left toe(s): Secondary | ICD-10-CM | POA: Diagnosis not present

## 2021-10-24 NOTE — Progress Notes (Signed)
This patient presents  to my office for at risk foot care.  This patient requires this care by a professional since this patient will be at risk due to having diabetes with no complications. This patient is unable to cut nails herself since the patient cannot reach her nails.These nails are painful walking and wearing shoes.  This patient presents for at risk foot care today.  General Appearance  Alert, conversant and in no acute stress.  Vascular  Dorsalis pedis and posterior tibial  pulses are palpable  bilaterally.  Capillary return is within normal limits  bilaterally. Temperature is within normal limits  bilaterally.  Neurologic  Senn-Weinstein monofilament wire test within normal limits  bilaterally. Muscle power within normal limits bilaterally.  Nails Thick disfigured discolored nails with subungual debris  from hallux to fifth toes bilaterally. No evidence of bacterial infection or drainage bilaterally.  Orthopedic  No limitations of motion  feet .  No crepitus or effusions noted.  No bony pathology or digital deformities noted.  Skin  normotropic skin with no porokeratosis noted bilaterally.  No signs of infections or ulcers noted.   Clavi 4th toe left foot asymptomatic.  Onychomycosis  Pain in right toes  Pain in left toes    Consent was obtained for treatment procedures.   Mechanical debridement of nails 1-5  bilaterally performed with a nail nipper.  Filed with dremel without incident.    Return office visit  3 months                    Told patient to return for periodic foot care and evaluation due to potential at risk complications.   Shaynah Hund DPM   

## 2021-10-25 DIAGNOSIS — M4310 Spondylolisthesis, site unspecified: Secondary | ICD-10-CM | POA: Diagnosis not present

## 2021-11-20 DIAGNOSIS — M48062 Spinal stenosis, lumbar region with neurogenic claudication: Secondary | ICD-10-CM | POA: Diagnosis not present

## 2021-12-18 DIAGNOSIS — M48062 Spinal stenosis, lumbar region with neurogenic claudication: Secondary | ICD-10-CM | POA: Diagnosis not present

## 2021-12-21 DIAGNOSIS — R634 Abnormal weight loss: Secondary | ICD-10-CM | POA: Diagnosis not present

## 2021-12-21 DIAGNOSIS — G8929 Other chronic pain: Secondary | ICD-10-CM | POA: Diagnosis not present

## 2021-12-21 DIAGNOSIS — I10 Essential (primary) hypertension: Secondary | ICD-10-CM | POA: Diagnosis not present

## 2021-12-21 DIAGNOSIS — M48061 Spinal stenosis, lumbar region without neurogenic claudication: Secondary | ICD-10-CM | POA: Diagnosis not present

## 2021-12-21 DIAGNOSIS — I700 Atherosclerosis of aorta: Secondary | ICD-10-CM | POA: Diagnosis not present

## 2022-01-08 DIAGNOSIS — M48062 Spinal stenosis, lumbar region with neurogenic claudication: Secondary | ICD-10-CM | POA: Diagnosis not present

## 2022-01-16 ENCOUNTER — Other Ambulatory Visit: Payer: Self-pay | Admitting: Cardiovascular Disease

## 2022-01-25 ENCOUNTER — Ambulatory Visit: Payer: Medicare PPO | Admitting: Podiatry

## 2022-01-25 ENCOUNTER — Other Ambulatory Visit: Payer: Self-pay | Admitting: Cardiovascular Disease

## 2022-01-28 ENCOUNTER — Telehealth: Payer: Self-pay | Admitting: Cardiovascular Disease

## 2022-01-28 MED ORDER — NIFEDIPINE ER 90 MG PO TB24: 90.0000 mg | ORAL_TABLET | Freq: Every day | ORAL | 8 refills | Status: DC

## 2022-01-28 NOTE — Telephone Encounter (Signed)
Pt c/o medication issue:  1. Name of Medication:   NIFEdipine (ADALAT CC) 90 MG 24 hr tablet   2. How are you currently taking this medication (dosage and times per day)?   As prescribed  3. Are you having a reaction (difficulty breathing--STAT)? N/A  4. What is your medication issue?   Caller stated patient has been getting her adherence package from their pharmacy.  Caller stated she is concerned regarding the coding for this prescription which was denied but also stated the prescription was faxed.  Caller would like clarification to refill this medication.

## 2022-02-19 DIAGNOSIS — Z8673 Personal history of transient ischemic attack (TIA), and cerebral infarction without residual deficits: Secondary | ICD-10-CM | POA: Diagnosis not present

## 2022-02-19 DIAGNOSIS — M48061 Spinal stenosis, lumbar region without neurogenic claudication: Secondary | ICD-10-CM | POA: Diagnosis not present

## 2022-02-19 DIAGNOSIS — Z1331 Encounter for screening for depression: Secondary | ICD-10-CM | POA: Diagnosis not present

## 2022-02-19 DIAGNOSIS — G809 Cerebral palsy, unspecified: Secondary | ICD-10-CM | POA: Diagnosis not present

## 2022-02-19 DIAGNOSIS — W19XXXA Unspecified fall, initial encounter: Secondary | ICD-10-CM | POA: Diagnosis not present

## 2022-02-19 DIAGNOSIS — E1169 Type 2 diabetes mellitus with other specified complication: Secondary | ICD-10-CM | POA: Diagnosis not present

## 2022-02-19 DIAGNOSIS — Z0000 Encounter for general adult medical examination without abnormal findings: Secondary | ICD-10-CM | POA: Diagnosis not present

## 2022-02-19 DIAGNOSIS — E7800 Pure hypercholesterolemia, unspecified: Secondary | ICD-10-CM | POA: Diagnosis not present

## 2022-02-19 DIAGNOSIS — H5340 Unspecified visual field defects: Secondary | ICD-10-CM | POA: Diagnosis not present

## 2022-02-19 DIAGNOSIS — I10 Essential (primary) hypertension: Secondary | ICD-10-CM | POA: Diagnosis not present

## 2022-02-21 DIAGNOSIS — E1169 Type 2 diabetes mellitus with other specified complication: Secondary | ICD-10-CM | POA: Diagnosis not present

## 2022-02-25 ENCOUNTER — Encounter: Payer: Self-pay | Admitting: Podiatry

## 2022-02-25 ENCOUNTER — Ambulatory Visit: Payer: Medicare PPO | Admitting: Podiatry

## 2022-02-25 VITALS — BP 144/62 | HR 66

## 2022-02-25 DIAGNOSIS — B351 Tinea unguium: Secondary | ICD-10-CM

## 2022-02-25 DIAGNOSIS — M79674 Pain in right toe(s): Secondary | ICD-10-CM

## 2022-02-25 DIAGNOSIS — M79675 Pain in left toe(s): Secondary | ICD-10-CM | POA: Diagnosis not present

## 2022-02-25 NOTE — Progress Notes (Signed)
This patient presents  to my office for at risk foot care.  This patient requires this care by a professional since this patient will be at risk due to having diabetes with no complications. This patient is unable to cut nails herself since the patient cannot reach her nails.These nails are painful walking and wearing shoes.  This patient presents for at risk foot care today.  General Appearance  Alert, conversant and in no acute stress.  Vascular  Dorsalis pedis and posterior tibial  pulses are palpable  bilaterally.  Capillary return is within normal limits  bilaterally. Temperature is within normal limits  bilaterally.  Neurologic  Senn-Weinstein monofilament wire test within normal limits  bilaterally. Muscle power within normal limits bilaterally.  Nails Thick disfigured discolored nails with subungual debris  from hallux to fifth toes bilaterally. No evidence of bacterial infection or drainage bilaterally.  Orthopedic  No limitations of motion  feet .  No crepitus or effusions noted.  No bony pathology or digital deformities noted.  Skin  normotropic skin with no porokeratosis noted bilaterally.  No signs of infections or ulcers noted.   Clavi 4th toe left foot asymptomatic.  Onychomycosis  Pain in right toes  Pain in left toes    Consent was obtained for treatment procedures.   Mechanical debridement of nails 1-5  bilaterally performed with a nail nipper.  Filed with dremel without incident.    Return office visit  3 months                    Told patient to return for periodic foot care and evaluation due to potential at risk complications.   Gardiner Barefoot DPM

## 2022-03-13 DIAGNOSIS — H401133 Primary open-angle glaucoma, bilateral, severe stage: Secondary | ICD-10-CM | POA: Diagnosis not present

## 2022-03-13 DIAGNOSIS — H16223 Keratoconjunctivitis sicca, not specified as Sjogren's, bilateral: Secondary | ICD-10-CM | POA: Diagnosis not present

## 2022-03-13 DIAGNOSIS — H43811 Vitreous degeneration, right eye: Secondary | ICD-10-CM | POA: Diagnosis not present

## 2022-03-13 DIAGNOSIS — H35033 Hypertensive retinopathy, bilateral: Secondary | ICD-10-CM | POA: Diagnosis not present

## 2022-03-13 DIAGNOSIS — E1139 Type 2 diabetes mellitus with other diabetic ophthalmic complication: Secondary | ICD-10-CM | POA: Diagnosis not present

## 2022-03-27 DIAGNOSIS — I10 Essential (primary) hypertension: Secondary | ICD-10-CM | POA: Diagnosis not present

## 2022-03-27 DIAGNOSIS — E1169 Type 2 diabetes mellitus with other specified complication: Secondary | ICD-10-CM | POA: Diagnosis not present

## 2022-03-27 DIAGNOSIS — W19XXXD Unspecified fall, subsequent encounter: Secondary | ICD-10-CM | POA: Diagnosis not present

## 2022-03-27 DIAGNOSIS — G809 Cerebral palsy, unspecified: Secondary | ICD-10-CM | POA: Diagnosis not present

## 2022-03-27 DIAGNOSIS — Z8673 Personal history of transient ischemic attack (TIA), and cerebral infarction without residual deficits: Secondary | ICD-10-CM | POA: Diagnosis not present

## 2022-03-27 DIAGNOSIS — H5340 Unspecified visual field defects: Secondary | ICD-10-CM | POA: Diagnosis not present

## 2022-03-27 DIAGNOSIS — M48061 Spinal stenosis, lumbar region without neurogenic claudication: Secondary | ICD-10-CM | POA: Diagnosis not present

## 2022-03-27 DIAGNOSIS — E7800 Pure hypercholesterolemia, unspecified: Secondary | ICD-10-CM | POA: Diagnosis not present

## 2022-03-29 DIAGNOSIS — W19XXXD Unspecified fall, subsequent encounter: Secondary | ICD-10-CM | POA: Diagnosis not present

## 2022-03-29 DIAGNOSIS — G809 Cerebral palsy, unspecified: Secondary | ICD-10-CM | POA: Diagnosis not present

## 2022-03-29 DIAGNOSIS — I10 Essential (primary) hypertension: Secondary | ICD-10-CM | POA: Diagnosis not present

## 2022-03-29 DIAGNOSIS — M48061 Spinal stenosis, lumbar region without neurogenic claudication: Secondary | ICD-10-CM | POA: Diagnosis not present

## 2022-03-29 DIAGNOSIS — E1169 Type 2 diabetes mellitus with other specified complication: Secondary | ICD-10-CM | POA: Diagnosis not present

## 2022-03-29 DIAGNOSIS — E7800 Pure hypercholesterolemia, unspecified: Secondary | ICD-10-CM | POA: Diagnosis not present

## 2022-03-29 DIAGNOSIS — H5340 Unspecified visual field defects: Secondary | ICD-10-CM | POA: Diagnosis not present

## 2022-03-29 DIAGNOSIS — Z8673 Personal history of transient ischemic attack (TIA), and cerebral infarction without residual deficits: Secondary | ICD-10-CM | POA: Diagnosis not present

## 2022-04-01 DIAGNOSIS — H5340 Unspecified visual field defects: Secondary | ICD-10-CM | POA: Diagnosis not present

## 2022-04-01 DIAGNOSIS — E1169 Type 2 diabetes mellitus with other specified complication: Secondary | ICD-10-CM | POA: Diagnosis not present

## 2022-04-01 DIAGNOSIS — Z8673 Personal history of transient ischemic attack (TIA), and cerebral infarction without residual deficits: Secondary | ICD-10-CM | POA: Diagnosis not present

## 2022-04-01 DIAGNOSIS — M48061 Spinal stenosis, lumbar region without neurogenic claudication: Secondary | ICD-10-CM | POA: Diagnosis not present

## 2022-04-01 DIAGNOSIS — I10 Essential (primary) hypertension: Secondary | ICD-10-CM | POA: Diagnosis not present

## 2022-04-01 DIAGNOSIS — E7800 Pure hypercholesterolemia, unspecified: Secondary | ICD-10-CM | POA: Diagnosis not present

## 2022-04-01 DIAGNOSIS — G809 Cerebral palsy, unspecified: Secondary | ICD-10-CM | POA: Diagnosis not present

## 2022-04-01 DIAGNOSIS — W19XXXD Unspecified fall, subsequent encounter: Secondary | ICD-10-CM | POA: Diagnosis not present

## 2022-04-04 DIAGNOSIS — M48061 Spinal stenosis, lumbar region without neurogenic claudication: Secondary | ICD-10-CM | POA: Diagnosis not present

## 2022-04-04 DIAGNOSIS — Z8673 Personal history of transient ischemic attack (TIA), and cerebral infarction without residual deficits: Secondary | ICD-10-CM | POA: Diagnosis not present

## 2022-04-04 DIAGNOSIS — G809 Cerebral palsy, unspecified: Secondary | ICD-10-CM | POA: Diagnosis not present

## 2022-04-04 DIAGNOSIS — E7800 Pure hypercholesterolemia, unspecified: Secondary | ICD-10-CM | POA: Diagnosis not present

## 2022-04-04 DIAGNOSIS — H5340 Unspecified visual field defects: Secondary | ICD-10-CM | POA: Diagnosis not present

## 2022-04-04 DIAGNOSIS — I10 Essential (primary) hypertension: Secondary | ICD-10-CM | POA: Diagnosis not present

## 2022-04-04 DIAGNOSIS — E1169 Type 2 diabetes mellitus with other specified complication: Secondary | ICD-10-CM | POA: Diagnosis not present

## 2022-04-04 DIAGNOSIS — W19XXXD Unspecified fall, subsequent encounter: Secondary | ICD-10-CM | POA: Diagnosis not present

## 2022-04-08 DIAGNOSIS — W19XXXD Unspecified fall, subsequent encounter: Secondary | ICD-10-CM | POA: Diagnosis not present

## 2022-04-08 DIAGNOSIS — H5340 Unspecified visual field defects: Secondary | ICD-10-CM | POA: Diagnosis not present

## 2022-04-08 DIAGNOSIS — I10 Essential (primary) hypertension: Secondary | ICD-10-CM | POA: Diagnosis not present

## 2022-04-08 DIAGNOSIS — E1169 Type 2 diabetes mellitus with other specified complication: Secondary | ICD-10-CM | POA: Diagnosis not present

## 2022-04-08 DIAGNOSIS — Z8673 Personal history of transient ischemic attack (TIA), and cerebral infarction without residual deficits: Secondary | ICD-10-CM | POA: Diagnosis not present

## 2022-04-08 DIAGNOSIS — M48061 Spinal stenosis, lumbar region without neurogenic claudication: Secondary | ICD-10-CM | POA: Diagnosis not present

## 2022-04-08 DIAGNOSIS — E7800 Pure hypercholesterolemia, unspecified: Secondary | ICD-10-CM | POA: Diagnosis not present

## 2022-04-08 DIAGNOSIS — G809 Cerebral palsy, unspecified: Secondary | ICD-10-CM | POA: Diagnosis not present

## 2022-04-10 ENCOUNTER — Encounter: Payer: Self-pay | Admitting: Neurology

## 2022-04-10 ENCOUNTER — Ambulatory Visit: Payer: Medicare PPO | Admitting: Neurology

## 2022-04-10 VITALS — BP 126/53 | HR 60 | Resp 18 | Ht 69.0 in | Wt 145.0 lb

## 2022-04-10 DIAGNOSIS — G629 Polyneuropathy, unspecified: Secondary | ICD-10-CM | POA: Diagnosis not present

## 2022-04-10 DIAGNOSIS — G40009 Localization-related (focal) (partial) idiopathic epilepsy and epileptic syndromes with seizures of localized onset, not intractable, without status epilepticus: Secondary | ICD-10-CM | POA: Diagnosis not present

## 2022-04-10 DIAGNOSIS — M5440 Lumbago with sciatica, unspecified side: Secondary | ICD-10-CM | POA: Diagnosis not present

## 2022-04-10 MED ORDER — CARBAMAZEPINE 200 MG PO TABS: 200.0000 mg | ORAL_TABLET | Freq: Three times a day (TID) | ORAL | 3 refills | Status: DC

## 2022-04-10 MED ORDER — GABAPENTIN 300 MG PO CAPS: ORAL_CAPSULE | ORAL | 3 refills | Status: DC

## 2022-04-10 NOTE — Progress Notes (Signed)
NEUROLOGY FOLLOW UP OFFICE NOTE  Donna Becker [id number] 06/23/1945  HISTORY OF PRESENT ILLNESS: I had the pleasure of seeing Donna Becker in follow-up in the neurology clinic on 04/10/2022.  The patient was last seen 7 months ago for seizures and dizziness. She is again accompanied by her sister [name] who helps supplement the history today. Records and images were personally reviewed where available. Since her last visit, she and [name] continue to deny any seizures since 2021 on carbamazepine '200mg'$  TID, no side effects. She lives with her brother. [name] comes daily. They have not seen any staring/unresponsive episodes. She denies any headaches, dizziness. Main concern continues to be back pain, she has seen Neurosurgery and started injections but continues to have pain. She was started on Gabapentin, currently on '300mg'$  TID, however [name] notes that since starting Gabapentin, she has had more falls and cognitive changes. Last fall was 2 weeks ago, she messed up her face. She states her knee is cracking, she has started doing PT. She notes the Gabapentin helps her sleep at night.    History on Initial Assessment 10/28/2019: This is a 77 year old left-handed woman with a history of hypertension, hyperlipidemia, diabetes, breast cancer with left arm lymphedema, cerebral palsy, seizures, presenting for evaluation of seizures, dizziness. Records from [facility] were reviewed, her last visit with epileptologist Dr. Opal Sidles was in 2011. She was born with right-sided cerebral palsy and started having seizures as a teenager. 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 She also had episodes of staring/confusion lasting up to 15-20 minutes. She was previously on phenobarbital and nitrofurantoin, then has been on Tegretol for many years. In March 2011, she had an episode of confusion and odd behavior followed by prolonged period of sleepiness. MRI/MRA brain no acute changes. She did not  tolerate increasing dose of Tegretol, at that time Levetiracetam '500mg'$  BID was added. On her visit in 2011, she reported frequent falls, due to left knee pain, she would fall because her right leg cannot support her. She states the doctor at [facility] gave her a white pill and the pain in her left leg was better. She reports the vessels in her left leg pops up, she has compression stockings which are uncomfortable. She is not taking the unrecalled medication since she moved to [place] more than 10 years ago. Gabapentin is on her medication list on [facility], however she does not think she is taking it. It is not on the medication list from Dr. Lina Sar last office visit note. She denies any seizures for many, many years. She manages her own medications and states she takes them religiously. She lives with her brother who has not mentioned any staring/unresponsive episodes. She denies any gaps in time, olfactory/gustatory hallucinations, myoclonic jerks. She takes carbamazepine '200mg'$  1 tab in AM, 2 tabs in PM without side effects.  In April, her sister called Dr. Delfina Redwood to report sudden onset fainting episodes at home. She fainted and fell suddenly while walking at home, then had another event 2 weeks later. She is alone in the office today and denies any episodes of loss of consciousness. Notes from Dr. Delfina Redwood were reviewed, she was reporting feeling weak, dizzy, like the room is dizzy. Symptoms typically occur when changing position. Orthostatic vital signs were negative at her visit. Head CT was ordered but she did not do the test. She states the dizziness is not as much as it was. She has to be very careful with how  she moves, she would be in bed and try to move then start spinning. No nausea/vomiting, this does not occur daily. She has had falls but is unsure if they are associated with the dizziness. She reports frequent falls, last fall was last Monday, she had her cane when she fell. She has to be very careful,  before she knows it, she is on the floor, no loss of consciousness. She reports her left leg is so painful. Due to her right sided CP, she has always depended on the left side, but now it is "not my side anymore." She was previously taking aspirin for the pain but stopped since ineffective. She has left-sided back pain and neck pain down her left shoulder. No headaches, diplopia, dysarthria/dysphagia. She reports her last HbA1c was okay. Her last PT session was 2 years ago where she was given exercises for left leg pain, she continues to do them.   MRI lumbar spine in 2012 showed degenerative anterolisthesis and uncovering of the disc at L4-5 and L5-S1; Right greater than left lateral recess narrowing at L4-5; Mild lateral recess narrowing bilaterally at L5-S1; Prominent epidural fat at L4-5 and L5-S1 compatible with epidural lipomatosis; Mild facet hypertrophy L3-4 without significant stenosis.   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 There is no history of febrile convulsions, CNS infections such as meningitis/encephalitis, significant traumatic brain injury, neurosurgical procedures, or family history of seizures.  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 No acute change.   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   MRI lumbar spine without contrast 02/2021 showed increased chronic grade 1 anterolisthesis at L4-L5 and L5-S1 with disc and advanced posterior element degeneration, increased severe spinal stenosis at L4-L5, with severe lateral recess involvement, query left L5 radiculitis. There was moderate left and mild  right L5 foraminal stenosis, mild left L2 foraminal stenosis. There was note of a bulky chronic left renal calculus suspected at the left renal pelvis, 51m.   PAST MEDICAL HISTORY: Past Medical History:  Diagnosis Date   Arthritis    [place]    left breast cancer /with retained lymph edema left arm   Cerebral palsy, hemiplegic (HCC)    right sided weakness- ambulates with walker   Diabetes mellitus without complication (HCC)    Glaucoma    Gout    History of shingles    Hypertension    Neuromuscular disorder (HCC)    neuropathy, right sided paresis, left arm numbness   Seizure ([facility])    [place]    Transfusion history    '90's when had breast cancer surgery    MEDICATIONS: Current Outpatient Medications on File Prior to Visit  Medication Sig Dispense Refill   allopurinol (ZYLOPRIM) 100 MG tablet Take 100 mg by mouth daily.     B Complex-C (B-COMPLEX WITH VITAMIN C) tablet Take 1 tablet by mouth daily.     bimatoprost (LUMIGAN) 0.01 % SOLN Place 1 drop into both eyes at bedtime.     carbamazepine (TEGRETOL) 200 MG tablet Take 1 tablet (200 mg total) by mouth 3 (three) times daily. 270 tablet 3   carvedilol (COREG) 25 MG tablet TAKE ONE TABLET BY MOUTH TWICE DAILY Needs appointment  for further refills 60 tablet 3   colchicine 0.6 MG tablet Take 0.6 mg by mouth daily.     furosemide (LASIX) 20 MG tablet Take 20 mg by mouth every morning.     gabapentin (NEURONTIN) 300 MG capsule Take 3 capsules at bedtime 270 capsule 3   glipiZIDE (GLUCOTROL XL) 10 MG 24 hr tablet Take 10 mg by mouth daily.     hydrALAZINE (APRESOLINE) 100 MG tablet Take 100 mg by mouth 2 (two) times daily.     losartan (COZAAR) 100 MG tablet Take 100 mg by mouth every evening.     metFORMIN (GLUCOPHAGE) 500 MG tablet Take 500 mg by mouth 2 (two) times daily with a meal.     Multiple Vitamin (MULTIVITAMIN WITH MINERALS) TABS tablet Take 1 tablet by mouth daily.     NIFEdipine (ADALAT CC) 90 MG 24 hr  tablet Take 1 tablet (90 mg total) by mouth daily. 30 tablet 8   pravastatin (PRAVACHOL) 40 MG tablet Take 40 mg by mouth every evening.     Probiotic Product (PROBIOTIC DAILY) CAPS Take 1 capsule by mouth daily.     No current facility-administered medications on file prior to visit.    ALLERGIES: Allergies  Allergen Reactions   Codeine Itching and Rash    FAMILY HISTORY: Family History  Problem Relation Age of Onset   Hypertension Mother    Arthritis Sister     SOCIAL HISTORY: Social History   Socioeconomic History   Marital status: Single    Spouse name: Not on file   Number of children: Not on file   Years of education: Not on file   Highest education level: Not on file  Occupational History   Occupation: retired Licensed conveyancer  Tobacco Use   Smoking status: Former    Years: 1.00    Types: Cigarettes    Quit date: 01/06/1969    Years since quitting: 53.2   Smokeless tobacco: Never  Vaping Use   Vaping Use: Never used  Substance and Sexual Activity   Alcohol use: No   Drug use: No   Sexual activity: Not Currently  Other Topics Concern   Not on file  Social History Narrative   Left Handed   One Story Home   No caffeine   Social Determinants of Health   Financial Resource Strain: Not on file  Food Insecurity: Not on file  Transportation Needs: Not on file  Physical Activity: Not on file  Stress: Not on file  Social Connections: Not on file  Intimate Partner Violence: Not on file     PHYSICAL EXAM: Vitals:   04/10/22 1504  BP: (!) 126/53  Pulse: 60  Resp: 18  SpO2: 98%   General: No acute distress Head:  Normocephalic/atraumatic Skin/Extremities: No rash, left arm lymphedema (chronic) Neurological Exam: alert and oriented to person, place, and time. No aphasia or dysarthria. Fund of knowledge is appropriate.  Recent and remote memory are impaired, 1/3 delayed recall. Attention and concentration are normal.   Cranial nerves: Pupils equal, round.  Extraocular movements intact with no nystagmus. Visual fields full.  No facial asymmetry.  Motor: atrophy on muscles of right hand. Increased tone on right UE and LE, 5/5 proximal right UE, 3/5 distal right UE, 4/5 right hip flexion, 4/5 left UE, 3/5 left hip flexion. Mild ataxia on right finger to nose (similar to prior). Gait not tested.    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 They continue to deny any seizures since 2021 on carbamazepine '200mg'$  TID. Main concern continues to be back pain, she was started on Gabapentin but sister notes more falls and memory changes. She feels it helps with sleep. Reduce to '300mg'$  qhs. Continue follow-up with Neurosurgery for back pain. She does not drive. Follow-up in 6 months, call for any changes.    Thank you for allowing me to participate in her care.  Please do not hesitate to call for any questions or concerns.    Ellouise Newer, M.D.   CC: Dr. Delfina Redwood

## 2022-04-10 NOTE — Patient Instructions (Signed)
Good to see you.  Reduce Gabapentin '300mg'$ : Take 1 capsule every night  2. Continue carbamazepine '200mg'$ : take 1 tablet three times a day  3. Follow-up with Neurosurgery for next steps on back pain  4. Follow-up in 6 months, call for any changes   Seizure Precautions: 1. If medication has been prescribed for you to prevent seizures, take it exactly as directed.  Do not stop taking the medicine without talking to your doctor first, even if you have not had a seizure in a long time.   2. Avoid activities in which a seizure would cause danger to yourself or to others.  Don't operate dangerous machinery, swim alone, or climb in high or dangerous places, such as on ladders, roofs, or girders.  Do not drive unless your doctor says you may.  3. If you have any warning that you may have a seizure, lay down in a safe place where you can't hurt yourself.    4.  No driving for 6 months from last seizure, as per [facility].   Please refer to the following link on the [facility] website for more information: http://www.epilepsyfoundation.org/answerplace/Social/driving/drivingu.cfm   5.  Maintain good sleep hygiene.  6.  Contact your doctor if you have any problems that may be related to the medicine you are taking.  7.  Call 911 and bring the patient back to the ED if:        A.  The seizure lasts longer than 5 minutes.       B.  The patient doesn't awaken shortly after the seizure  C.  The patient has new problems such as difficulty seeing, speaking or moving  D.  The patient was injured during the seizure  E.  The patient has a temperature over 102 F (39C)  F.  The patient vomited and now is having trouble breathing

## 2022-04-11 DIAGNOSIS — W19XXXD Unspecified fall, subsequent encounter: Secondary | ICD-10-CM | POA: Diagnosis not present

## 2022-04-11 DIAGNOSIS — E1169 Type 2 diabetes mellitus with other specified complication: Secondary | ICD-10-CM | POA: Diagnosis not present

## 2022-04-11 DIAGNOSIS — I10 Essential (primary) hypertension: Secondary | ICD-10-CM | POA: Diagnosis not present

## 2022-04-11 DIAGNOSIS — M48061 Spinal stenosis, lumbar region without neurogenic claudication: Secondary | ICD-10-CM | POA: Diagnosis not present

## 2022-04-11 DIAGNOSIS — G809 Cerebral palsy, unspecified: Secondary | ICD-10-CM | POA: Diagnosis not present

## 2022-04-11 DIAGNOSIS — Z8673 Personal history of transient ischemic attack (TIA), and cerebral infarction without residual deficits: Secondary | ICD-10-CM | POA: Diagnosis not present

## 2022-04-11 DIAGNOSIS — H5340 Unspecified visual field defects: Secondary | ICD-10-CM | POA: Diagnosis not present

## 2022-04-11 DIAGNOSIS — E7800 Pure hypercholesterolemia, unspecified: Secondary | ICD-10-CM | POA: Diagnosis not present

## 2022-04-15 DIAGNOSIS — G809 Cerebral palsy, unspecified: Secondary | ICD-10-CM | POA: Diagnosis not present

## 2022-04-15 DIAGNOSIS — W19XXXD Unspecified fall, subsequent encounter: Secondary | ICD-10-CM | POA: Diagnosis not present

## 2022-04-15 DIAGNOSIS — Z8673 Personal history of transient ischemic attack (TIA), and cerebral infarction without residual deficits: Secondary | ICD-10-CM | POA: Diagnosis not present

## 2022-04-15 DIAGNOSIS — I10 Essential (primary) hypertension: Secondary | ICD-10-CM | POA: Diagnosis not present

## 2022-04-15 DIAGNOSIS — M48061 Spinal stenosis, lumbar region without neurogenic claudication: Secondary | ICD-10-CM | POA: Diagnosis not present

## 2022-04-15 DIAGNOSIS — E7800 Pure hypercholesterolemia, unspecified: Secondary | ICD-10-CM | POA: Diagnosis not present

## 2022-04-15 DIAGNOSIS — E1169 Type 2 diabetes mellitus with other specified complication: Secondary | ICD-10-CM | POA: Diagnosis not present

## 2022-04-15 DIAGNOSIS — H5340 Unspecified visual field defects: Secondary | ICD-10-CM | POA: Diagnosis not present

## 2022-04-17 DIAGNOSIS — M48061 Spinal stenosis, lumbar region without neurogenic claudication: Secondary | ICD-10-CM | POA: Diagnosis not present

## 2022-04-17 DIAGNOSIS — H5340 Unspecified visual field defects: Secondary | ICD-10-CM | POA: Diagnosis not present

## 2022-04-17 DIAGNOSIS — W19XXXD Unspecified fall, subsequent encounter: Secondary | ICD-10-CM | POA: Diagnosis not present

## 2022-04-17 DIAGNOSIS — E1169 Type 2 diabetes mellitus with other specified complication: Secondary | ICD-10-CM | POA: Diagnosis not present

## 2022-04-17 DIAGNOSIS — G809 Cerebral palsy, unspecified: Secondary | ICD-10-CM | POA: Diagnosis not present

## 2022-04-17 DIAGNOSIS — E7800 Pure hypercholesterolemia, unspecified: Secondary | ICD-10-CM | POA: Diagnosis not present

## 2022-04-17 DIAGNOSIS — I10 Essential (primary) hypertension: Secondary | ICD-10-CM | POA: Diagnosis not present

## 2022-04-17 DIAGNOSIS — Z8673 Personal history of transient ischemic attack (TIA), and cerebral infarction without residual deficits: Secondary | ICD-10-CM | POA: Diagnosis not present

## 2022-04-18 DIAGNOSIS — E1169 Type 2 diabetes mellitus with other specified complication: Secondary | ICD-10-CM | POA: Diagnosis not present

## 2022-04-18 DIAGNOSIS — Z8673 Personal history of transient ischemic attack (TIA), and cerebral infarction without residual deficits: Secondary | ICD-10-CM | POA: Diagnosis not present

## 2022-04-18 DIAGNOSIS — M48061 Spinal stenosis, lumbar region without neurogenic claudication: Secondary | ICD-10-CM | POA: Diagnosis not present

## 2022-04-18 DIAGNOSIS — I10 Essential (primary) hypertension: Secondary | ICD-10-CM | POA: Diagnosis not present

## 2022-04-18 DIAGNOSIS — G809 Cerebral palsy, unspecified: Secondary | ICD-10-CM | POA: Diagnosis not present

## 2022-04-18 DIAGNOSIS — H5340 Unspecified visual field defects: Secondary | ICD-10-CM | POA: Diagnosis not present

## 2022-04-18 DIAGNOSIS — E7800 Pure hypercholesterolemia, unspecified: Secondary | ICD-10-CM | POA: Diagnosis not present

## 2022-04-18 DIAGNOSIS — W19XXXD Unspecified fall, subsequent encounter: Secondary | ICD-10-CM | POA: Diagnosis not present

## 2022-04-23 DIAGNOSIS — E7800 Pure hypercholesterolemia, unspecified: Secondary | ICD-10-CM | POA: Diagnosis not present

## 2022-04-23 DIAGNOSIS — Z8673 Personal history of transient ischemic attack (TIA), and cerebral infarction without residual deficits: Secondary | ICD-10-CM | POA: Diagnosis not present

## 2022-04-23 DIAGNOSIS — H5340 Unspecified visual field defects: Secondary | ICD-10-CM | POA: Diagnosis not present

## 2022-04-23 DIAGNOSIS — G809 Cerebral palsy, unspecified: Secondary | ICD-10-CM | POA: Diagnosis not present

## 2022-04-23 DIAGNOSIS — I10 Essential (primary) hypertension: Secondary | ICD-10-CM | POA: Diagnosis not present

## 2022-04-23 DIAGNOSIS — W19XXXD Unspecified fall, subsequent encounter: Secondary | ICD-10-CM | POA: Diagnosis not present

## 2022-04-23 DIAGNOSIS — E1169 Type 2 diabetes mellitus with other specified complication: Secondary | ICD-10-CM | POA: Diagnosis not present

## 2022-04-23 DIAGNOSIS — M48061 Spinal stenosis, lumbar region without neurogenic claudication: Secondary | ICD-10-CM | POA: Diagnosis not present

## 2022-04-24 DIAGNOSIS — E1169 Type 2 diabetes mellitus with other specified complication: Secondary | ICD-10-CM | POA: Diagnosis not present

## 2022-04-24 DIAGNOSIS — Z8673 Personal history of transient ischemic attack (TIA), and cerebral infarction without residual deficits: Secondary | ICD-10-CM | POA: Diagnosis not present

## 2022-04-24 DIAGNOSIS — G809 Cerebral palsy, unspecified: Secondary | ICD-10-CM | POA: Diagnosis not present

## 2022-04-24 DIAGNOSIS — I10 Essential (primary) hypertension: Secondary | ICD-10-CM | POA: Diagnosis not present

## 2022-04-24 DIAGNOSIS — W19XXXD Unspecified fall, subsequent encounter: Secondary | ICD-10-CM | POA: Diagnosis not present

## 2022-04-24 DIAGNOSIS — E7800 Pure hypercholesterolemia, unspecified: Secondary | ICD-10-CM | POA: Diagnosis not present

## 2022-04-24 DIAGNOSIS — M48061 Spinal stenosis, lumbar region without neurogenic claudication: Secondary | ICD-10-CM | POA: Diagnosis not present

## 2022-04-24 DIAGNOSIS — H5340 Unspecified visual field defects: Secondary | ICD-10-CM | POA: Diagnosis not present

## 2022-04-25 DIAGNOSIS — H5340 Unspecified visual field defects: Secondary | ICD-10-CM | POA: Diagnosis not present

## 2022-04-25 DIAGNOSIS — E1169 Type 2 diabetes mellitus with other specified complication: Secondary | ICD-10-CM | POA: Diagnosis not present

## 2022-04-25 DIAGNOSIS — Z8673 Personal history of transient ischemic attack (TIA), and cerebral infarction without residual deficits: Secondary | ICD-10-CM | POA: Diagnosis not present

## 2022-04-25 DIAGNOSIS — M48061 Spinal stenosis, lumbar region without neurogenic claudication: Secondary | ICD-10-CM | POA: Diagnosis not present

## 2022-04-25 DIAGNOSIS — I10 Essential (primary) hypertension: Secondary | ICD-10-CM | POA: Diagnosis not present

## 2022-04-25 DIAGNOSIS — W19XXXD Unspecified fall, subsequent encounter: Secondary | ICD-10-CM | POA: Diagnosis not present

## 2022-04-25 DIAGNOSIS — E7800 Pure hypercholesterolemia, unspecified: Secondary | ICD-10-CM | POA: Diagnosis not present

## 2022-04-25 DIAGNOSIS — G809 Cerebral palsy, unspecified: Secondary | ICD-10-CM | POA: Diagnosis not present

## 2022-04-29 DIAGNOSIS — W19XXXD Unspecified fall, subsequent encounter: Secondary | ICD-10-CM | POA: Diagnosis not present

## 2022-04-29 DIAGNOSIS — E7800 Pure hypercholesterolemia, unspecified: Secondary | ICD-10-CM | POA: Diagnosis not present

## 2022-04-29 DIAGNOSIS — H5340 Unspecified visual field defects: Secondary | ICD-10-CM | POA: Diagnosis not present

## 2022-04-29 DIAGNOSIS — I10 Essential (primary) hypertension: Secondary | ICD-10-CM | POA: Diagnosis not present

## 2022-04-29 DIAGNOSIS — Z8673 Personal history of transient ischemic attack (TIA), and cerebral infarction without residual deficits: Secondary | ICD-10-CM | POA: Diagnosis not present

## 2022-04-29 DIAGNOSIS — E1169 Type 2 diabetes mellitus with other specified complication: Secondary | ICD-10-CM | POA: Diagnosis not present

## 2022-04-29 DIAGNOSIS — G809 Cerebral palsy, unspecified: Secondary | ICD-10-CM | POA: Diagnosis not present

## 2022-04-29 DIAGNOSIS — M48061 Spinal stenosis, lumbar region without neurogenic claudication: Secondary | ICD-10-CM | POA: Diagnosis not present

## 2022-05-02 DIAGNOSIS — G809 Cerebral palsy, unspecified: Secondary | ICD-10-CM | POA: Diagnosis not present

## 2022-05-02 DIAGNOSIS — H5340 Unspecified visual field defects: Secondary | ICD-10-CM | POA: Diagnosis not present

## 2022-05-02 DIAGNOSIS — Z8673 Personal history of transient ischemic attack (TIA), and cerebral infarction without residual deficits: Secondary | ICD-10-CM | POA: Diagnosis not present

## 2022-05-02 DIAGNOSIS — E7800 Pure hypercholesterolemia, unspecified: Secondary | ICD-10-CM | POA: Diagnosis not present

## 2022-05-02 DIAGNOSIS — W19XXXD Unspecified fall, subsequent encounter: Secondary | ICD-10-CM | POA: Diagnosis not present

## 2022-05-02 DIAGNOSIS — M48061 Spinal stenosis, lumbar region without neurogenic claudication: Secondary | ICD-10-CM | POA: Diagnosis not present

## 2022-05-02 DIAGNOSIS — E1169 Type 2 diabetes mellitus with other specified complication: Secondary | ICD-10-CM | POA: Diagnosis not present

## 2022-05-02 DIAGNOSIS — I10 Essential (primary) hypertension: Secondary | ICD-10-CM | POA: Diagnosis not present

## 2022-05-09 DIAGNOSIS — G809 Cerebral palsy, unspecified: Secondary | ICD-10-CM | POA: Diagnosis not present

## 2022-05-09 DIAGNOSIS — Z8673 Personal history of transient ischemic attack (TIA), and cerebral infarction without residual deficits: Secondary | ICD-10-CM | POA: Diagnosis not present

## 2022-05-09 DIAGNOSIS — E1169 Type 2 diabetes mellitus with other specified complication: Secondary | ICD-10-CM | POA: Diagnosis not present

## 2022-05-09 DIAGNOSIS — E7800 Pure hypercholesterolemia, unspecified: Secondary | ICD-10-CM | POA: Diagnosis not present

## 2022-05-09 DIAGNOSIS — W19XXXD Unspecified fall, subsequent encounter: Secondary | ICD-10-CM | POA: Diagnosis not present

## 2022-05-09 DIAGNOSIS — M48061 Spinal stenosis, lumbar region without neurogenic claudication: Secondary | ICD-10-CM | POA: Diagnosis not present

## 2022-05-09 DIAGNOSIS — H5340 Unspecified visual field defects: Secondary | ICD-10-CM | POA: Diagnosis not present

## 2022-05-09 DIAGNOSIS — I10 Essential (primary) hypertension: Secondary | ICD-10-CM | POA: Diagnosis not present

## 2022-05-13 ENCOUNTER — Ambulatory Visit
Admission: RE | Admit: 2022-05-13 | Discharge: 2022-05-13 | Disposition: A | Discharge status: Home / Self Care | Payer: Medicare PPO | Source: Ambulatory Visit | Attending: Internal Medicine | Admitting: Internal Medicine

## 2022-05-13 ENCOUNTER — Other Ambulatory Visit: Payer: Self-pay | Admitting: Internal Medicine

## 2022-05-13 DIAGNOSIS — M75100 Unspecified rotator cuff tear or rupture of unspecified shoulder, not specified as traumatic: Secondary | ICD-10-CM | POA: Diagnosis not present

## 2022-05-13 DIAGNOSIS — Q059 Spina bifida, unspecified: Secondary | ICD-10-CM | POA: Diagnosis not present

## 2022-05-13 DIAGNOSIS — M25562 Pain in left knee: Secondary | ICD-10-CM | POA: Diagnosis not present

## 2022-05-13 DIAGNOSIS — R269 Unspecified abnormalities of gait and mobility: Secondary | ICD-10-CM | POA: Diagnosis not present

## 2022-05-13 DIAGNOSIS — M4800 Spinal stenosis, site unspecified: Secondary | ICD-10-CM | POA: Diagnosis not present

## 2022-05-13 DIAGNOSIS — R569 Unspecified convulsions: Secondary | ICD-10-CM | POA: Diagnosis not present

## 2022-05-13 DIAGNOSIS — G809 Cerebral palsy, unspecified: Secondary | ICD-10-CM | POA: Diagnosis not present

## 2022-05-13 DIAGNOSIS — W19XXXA Unspecified fall, initial encounter: Secondary | ICD-10-CM | POA: Diagnosis not present

## 2022-05-14 DIAGNOSIS — E1169 Type 2 diabetes mellitus with other specified complication: Secondary | ICD-10-CM | POA: Diagnosis not present

## 2022-05-14 DIAGNOSIS — I10 Essential (primary) hypertension: Secondary | ICD-10-CM | POA: Diagnosis not present

## 2022-05-14 DIAGNOSIS — W19XXXD Unspecified fall, subsequent encounter: Secondary | ICD-10-CM | POA: Diagnosis not present

## 2022-05-14 DIAGNOSIS — E7800 Pure hypercholesterolemia, unspecified: Secondary | ICD-10-CM | POA: Diagnosis not present

## 2022-05-14 DIAGNOSIS — Z8673 Personal history of transient ischemic attack (TIA), and cerebral infarction without residual deficits: Secondary | ICD-10-CM | POA: Diagnosis not present

## 2022-05-14 DIAGNOSIS — H5340 Unspecified visual field defects: Secondary | ICD-10-CM | POA: Diagnosis not present

## 2022-05-14 DIAGNOSIS — G809 Cerebral palsy, unspecified: Secondary | ICD-10-CM | POA: Diagnosis not present

## 2022-05-14 DIAGNOSIS — M48061 Spinal stenosis, lumbar region without neurogenic claudication: Secondary | ICD-10-CM | POA: Diagnosis not present

## 2022-05-20 DIAGNOSIS — E1169 Type 2 diabetes mellitus with other specified complication: Secondary | ICD-10-CM | POA: Diagnosis not present

## 2022-05-20 DIAGNOSIS — W19XXXD Unspecified fall, subsequent encounter: Secondary | ICD-10-CM | POA: Diagnosis not present

## 2022-05-20 DIAGNOSIS — G809 Cerebral palsy, unspecified: Secondary | ICD-10-CM | POA: Diagnosis not present

## 2022-05-20 DIAGNOSIS — I10 Essential (primary) hypertension: Secondary | ICD-10-CM | POA: Diagnosis not present

## 2022-05-20 DIAGNOSIS — Z8673 Personal history of transient ischemic attack (TIA), and cerebral infarction without residual deficits: Secondary | ICD-10-CM | POA: Diagnosis not present

## 2022-05-20 DIAGNOSIS — H5340 Unspecified visual field defects: Secondary | ICD-10-CM | POA: Diagnosis not present

## 2022-05-20 DIAGNOSIS — M48061 Spinal stenosis, lumbar region without neurogenic claudication: Secondary | ICD-10-CM | POA: Diagnosis not present

## 2022-05-20 DIAGNOSIS — E7800 Pure hypercholesterolemia, unspecified: Secondary | ICD-10-CM | POA: Diagnosis not present

## 2022-05-22 DIAGNOSIS — M1712 Unilateral primary osteoarthritis, left knee: Secondary | ICD-10-CM | POA: Diagnosis not present

## 2022-05-22 DIAGNOSIS — M25462 Effusion, left knee: Secondary | ICD-10-CM | POA: Diagnosis not present

## 2022-05-28 ENCOUNTER — Ambulatory Visit: Payer: Medicare PPO | Admitting: Podiatry

## 2022-06-06 DIAGNOSIS — I890 Lymphedema, not elsewhere classified: Secondary | ICD-10-CM | POA: Diagnosis not present

## 2022-06-06 DIAGNOSIS — G8929 Other chronic pain: Secondary | ICD-10-CM | POA: Diagnosis not present

## 2022-06-06 DIAGNOSIS — G40909 Epilepsy, unspecified, not intractable, without status epilepticus: Secondary | ICD-10-CM | POA: Diagnosis not present

## 2022-06-06 DIAGNOSIS — E119 Type 2 diabetes mellitus without complications: Secondary | ICD-10-CM | POA: Diagnosis not present

## 2022-06-06 DIAGNOSIS — M109 Gout, unspecified: Secondary | ICD-10-CM | POA: Diagnosis not present

## 2022-06-06 DIAGNOSIS — I10 Essential (primary) hypertension: Secondary | ICD-10-CM | POA: Diagnosis not present

## 2022-06-06 DIAGNOSIS — G809 Cerebral palsy, unspecified: Secondary | ICD-10-CM | POA: Diagnosis not present

## 2022-06-06 DIAGNOSIS — H409 Unspecified glaucoma: Secondary | ICD-10-CM | POA: Diagnosis not present

## 2022-06-06 DIAGNOSIS — M1712 Unilateral primary osteoarthritis, left knee: Secondary | ICD-10-CM | POA: Diagnosis not present

## 2022-06-12 DIAGNOSIS — H16223 Keratoconjunctivitis sicca, not specified as Sjogren's, bilateral: Secondary | ICD-10-CM | POA: Diagnosis not present

## 2022-06-12 DIAGNOSIS — E1139 Type 2 diabetes mellitus with other diabetic ophthalmic complication: Secondary | ICD-10-CM | POA: Diagnosis not present

## 2022-06-12 DIAGNOSIS — H43811 Vitreous degeneration, right eye: Secondary | ICD-10-CM | POA: Diagnosis not present

## 2022-06-12 DIAGNOSIS — H401133 Primary open-angle glaucoma, bilateral, severe stage: Secondary | ICD-10-CM | POA: Diagnosis not present

## 2022-06-12 DIAGNOSIS — H35033 Hypertensive retinopathy, bilateral: Secondary | ICD-10-CM | POA: Diagnosis not present

## 2022-06-13 DIAGNOSIS — G8929 Other chronic pain: Secondary | ICD-10-CM | POA: Diagnosis not present

## 2022-06-13 DIAGNOSIS — H409 Unspecified glaucoma: Secondary | ICD-10-CM | POA: Diagnosis not present

## 2022-06-13 DIAGNOSIS — E119 Type 2 diabetes mellitus without complications: Secondary | ICD-10-CM | POA: Diagnosis not present

## 2022-06-13 DIAGNOSIS — M1712 Unilateral primary osteoarthritis, left knee: Secondary | ICD-10-CM | POA: Diagnosis not present

## 2022-06-13 DIAGNOSIS — G809 Cerebral palsy, unspecified: Secondary | ICD-10-CM | POA: Diagnosis not present

## 2022-06-13 DIAGNOSIS — M109 Gout, unspecified: Secondary | ICD-10-CM | POA: Diagnosis not present

## 2022-06-13 DIAGNOSIS — I890 Lymphedema, not elsewhere classified: Secondary | ICD-10-CM | POA: Diagnosis not present

## 2022-06-13 DIAGNOSIS — G40909 Epilepsy, unspecified, not intractable, without status epilepticus: Secondary | ICD-10-CM | POA: Diagnosis not present

## 2022-06-13 DIAGNOSIS — I10 Essential (primary) hypertension: Secondary | ICD-10-CM | POA: Diagnosis not present

## 2022-06-18 DIAGNOSIS — I890 Lymphedema, not elsewhere classified: Secondary | ICD-10-CM | POA: Diagnosis not present

## 2022-06-18 DIAGNOSIS — H409 Unspecified glaucoma: Secondary | ICD-10-CM | POA: Diagnosis not present

## 2022-06-18 DIAGNOSIS — G809 Cerebral palsy, unspecified: Secondary | ICD-10-CM | POA: Diagnosis not present

## 2022-06-18 DIAGNOSIS — M109 Gout, unspecified: Secondary | ICD-10-CM | POA: Diagnosis not present

## 2022-06-18 DIAGNOSIS — E119 Type 2 diabetes mellitus without complications: Secondary | ICD-10-CM | POA: Diagnosis not present

## 2022-06-18 DIAGNOSIS — I10 Essential (primary) hypertension: Secondary | ICD-10-CM | POA: Diagnosis not present

## 2022-06-18 DIAGNOSIS — G40909 Epilepsy, unspecified, not intractable, without status epilepticus: Secondary | ICD-10-CM | POA: Diagnosis not present

## 2022-06-18 DIAGNOSIS — G8929 Other chronic pain: Secondary | ICD-10-CM | POA: Diagnosis not present

## 2022-06-18 DIAGNOSIS — M1712 Unilateral primary osteoarthritis, left knee: Secondary | ICD-10-CM | POA: Diagnosis not present

## 2022-06-19 ENCOUNTER — Ambulatory Visit: Payer: Medicare PPO | Admitting: Podiatry

## 2022-06-19 DIAGNOSIS — M79674 Pain in right toe(s): Secondary | ICD-10-CM

## 2022-06-19 DIAGNOSIS — B351 Tinea unguium: Secondary | ICD-10-CM

## 2022-06-19 DIAGNOSIS — M79675 Pain in left toe(s): Secondary | ICD-10-CM

## 2022-06-19 NOTE — Progress Notes (Signed)
This patient presents  to my office for at risk foot care.  This patient requires this care by a professional since this patient will be at risk due to having diabetes with no complications. This patient is unable to cut nails herself since the patient cannot reach her nails.These nails are painful walking and wearing shoes.  This patient presents for at risk foot care today.  General Appearance  Alert, conversant and in no acute stress.  Vascular  Dorsalis pedis and posterior tibial  pulses are palpable  bilaterally.  Capillary return is within normal limits  bilaterally. Temperature is within normal limits  bilaterally.  Neurologic  Senn-Weinstein monofilament wire test within normal limits  bilaterally. Muscle power within normal limits bilaterally.  Nails Thick disfigured discolored nails with subungual debris  from hallux to fifth toes bilaterally. No evidence of bacterial infection or drainage bilaterally.  Orthopedic  No limitations of motion  feet .  No crepitus or effusions noted.  No bony pathology noted. Digital contractures.  Skin  normotropic skin with no porokeratosis noted bilaterally.  No signs of infections or ulcers noted.   Asymptomatic corn second toe left.    Onychomycosis  Pain in right toes  Pain in left toes    Consent was obtained for treatment procedures.   Mechanical debridement of nails 1-5  bilaterally performed with a nail nipper.  Filed with dremel without incident.    Return office visit  3 months                    Told patient to return for periodic foot care and evaluation due to potential at risk complications.   Gardiner Barefoot DPM

## 2022-06-21 DIAGNOSIS — G40909 Epilepsy, unspecified, not intractable, without status epilepticus: Secondary | ICD-10-CM | POA: Diagnosis not present

## 2022-06-21 DIAGNOSIS — I10 Essential (primary) hypertension: Secondary | ICD-10-CM | POA: Diagnosis not present

## 2022-06-21 DIAGNOSIS — H409 Unspecified glaucoma: Secondary | ICD-10-CM | POA: Diagnosis not present

## 2022-06-21 DIAGNOSIS — M109 Gout, unspecified: Secondary | ICD-10-CM | POA: Diagnosis not present

## 2022-06-21 DIAGNOSIS — E119 Type 2 diabetes mellitus without complications: Secondary | ICD-10-CM | POA: Diagnosis not present

## 2022-06-21 DIAGNOSIS — G8929 Other chronic pain: Secondary | ICD-10-CM | POA: Diagnosis not present

## 2022-06-21 DIAGNOSIS — M1712 Unilateral primary osteoarthritis, left knee: Secondary | ICD-10-CM | POA: Diagnosis not present

## 2022-06-21 DIAGNOSIS — G809 Cerebral palsy, unspecified: Secondary | ICD-10-CM | POA: Diagnosis not present

## 2022-06-21 DIAGNOSIS — I890 Lymphedema, not elsewhere classified: Secondary | ICD-10-CM | POA: Diagnosis not present

## 2022-06-26 DIAGNOSIS — M109 Gout, unspecified: Secondary | ICD-10-CM | POA: Diagnosis not present

## 2022-06-26 DIAGNOSIS — E119 Type 2 diabetes mellitus without complications: Secondary | ICD-10-CM | POA: Diagnosis not present

## 2022-06-26 DIAGNOSIS — G8929 Other chronic pain: Secondary | ICD-10-CM | POA: Diagnosis not present

## 2022-06-26 DIAGNOSIS — I890 Lymphedema, not elsewhere classified: Secondary | ICD-10-CM | POA: Diagnosis not present

## 2022-06-26 DIAGNOSIS — G809 Cerebral palsy, unspecified: Secondary | ICD-10-CM | POA: Diagnosis not present

## 2022-06-26 DIAGNOSIS — G40909 Epilepsy, unspecified, not intractable, without status epilepticus: Secondary | ICD-10-CM | POA: Diagnosis not present

## 2022-06-26 DIAGNOSIS — H409 Unspecified glaucoma: Secondary | ICD-10-CM | POA: Diagnosis not present

## 2022-06-26 DIAGNOSIS — I10 Essential (primary) hypertension: Secondary | ICD-10-CM | POA: Diagnosis not present

## 2022-06-26 DIAGNOSIS — M1712 Unilateral primary osteoarthritis, left knee: Secondary | ICD-10-CM | POA: Diagnosis not present

## 2022-06-28 DIAGNOSIS — E119 Type 2 diabetes mellitus without complications: Secondary | ICD-10-CM | POA: Diagnosis not present

## 2022-06-28 DIAGNOSIS — G40909 Epilepsy, unspecified, not intractable, without status epilepticus: Secondary | ICD-10-CM | POA: Diagnosis not present

## 2022-06-28 DIAGNOSIS — M109 Gout, unspecified: Secondary | ICD-10-CM | POA: Diagnosis not present

## 2022-06-28 DIAGNOSIS — I10 Essential (primary) hypertension: Secondary | ICD-10-CM | POA: Diagnosis not present

## 2022-06-28 DIAGNOSIS — M1712 Unilateral primary osteoarthritis, left knee: Secondary | ICD-10-CM | POA: Diagnosis not present

## 2022-06-28 DIAGNOSIS — G8929 Other chronic pain: Secondary | ICD-10-CM | POA: Diagnosis not present

## 2022-06-28 DIAGNOSIS — I890 Lymphedema, not elsewhere classified: Secondary | ICD-10-CM | POA: Diagnosis not present

## 2022-06-28 DIAGNOSIS — H409 Unspecified glaucoma: Secondary | ICD-10-CM | POA: Diagnosis not present

## 2022-06-28 DIAGNOSIS — G809 Cerebral palsy, unspecified: Secondary | ICD-10-CM | POA: Diagnosis not present

## 2022-07-03 DIAGNOSIS — I10 Essential (primary) hypertension: Secondary | ICD-10-CM | POA: Diagnosis not present

## 2022-07-03 DIAGNOSIS — H409 Unspecified glaucoma: Secondary | ICD-10-CM | POA: Diagnosis not present

## 2022-07-03 DIAGNOSIS — G8929 Other chronic pain: Secondary | ICD-10-CM | POA: Diagnosis not present

## 2022-07-03 DIAGNOSIS — M109 Gout, unspecified: Secondary | ICD-10-CM | POA: Diagnosis not present

## 2022-07-03 DIAGNOSIS — I890 Lymphedema, not elsewhere classified: Secondary | ICD-10-CM | POA: Diagnosis not present

## 2022-07-03 DIAGNOSIS — M1712 Unilateral primary osteoarthritis, left knee: Secondary | ICD-10-CM | POA: Diagnosis not present

## 2022-07-03 DIAGNOSIS — G809 Cerebral palsy, unspecified: Secondary | ICD-10-CM | POA: Diagnosis not present

## 2022-07-03 DIAGNOSIS — G40909 Epilepsy, unspecified, not intractable, without status epilepticus: Secondary | ICD-10-CM | POA: Diagnosis not present

## 2022-07-03 DIAGNOSIS — E119 Type 2 diabetes mellitus without complications: Secondary | ICD-10-CM | POA: Diagnosis not present

## 2022-07-06 DIAGNOSIS — G40909 Epilepsy, unspecified, not intractable, without status epilepticus: Secondary | ICD-10-CM | POA: Diagnosis not present

## 2022-07-06 DIAGNOSIS — I10 Essential (primary) hypertension: Secondary | ICD-10-CM | POA: Diagnosis not present

## 2022-07-06 DIAGNOSIS — G8929 Other chronic pain: Secondary | ICD-10-CM | POA: Diagnosis not present

## 2022-07-06 DIAGNOSIS — E119 Type 2 diabetes mellitus without complications: Secondary | ICD-10-CM | POA: Diagnosis not present

## 2022-07-06 DIAGNOSIS — I890 Lymphedema, not elsewhere classified: Secondary | ICD-10-CM | POA: Diagnosis not present

## 2022-07-06 DIAGNOSIS — H409 Unspecified glaucoma: Secondary | ICD-10-CM | POA: Diagnosis not present

## 2022-07-06 DIAGNOSIS — M1712 Unilateral primary osteoarthritis, left knee: Secondary | ICD-10-CM | POA: Diagnosis not present

## 2022-07-06 DIAGNOSIS — M109 Gout, unspecified: Secondary | ICD-10-CM | POA: Diagnosis not present

## 2022-07-06 DIAGNOSIS — G809 Cerebral palsy, unspecified: Secondary | ICD-10-CM | POA: Diagnosis not present

## 2022-07-09 DIAGNOSIS — M1712 Unilateral primary osteoarthritis, left knee: Secondary | ICD-10-CM | POA: Diagnosis not present

## 2022-07-09 DIAGNOSIS — G40909 Epilepsy, unspecified, not intractable, without status epilepticus: Secondary | ICD-10-CM | POA: Diagnosis not present

## 2022-07-09 DIAGNOSIS — M109 Gout, unspecified: Secondary | ICD-10-CM | POA: Diagnosis not present

## 2022-07-09 DIAGNOSIS — H409 Unspecified glaucoma: Secondary | ICD-10-CM | POA: Diagnosis not present

## 2022-07-09 DIAGNOSIS — G8929 Other chronic pain: Secondary | ICD-10-CM | POA: Diagnosis not present

## 2022-07-09 DIAGNOSIS — I890 Lymphedema, not elsewhere classified: Secondary | ICD-10-CM | POA: Diagnosis not present

## 2022-07-09 DIAGNOSIS — E119 Type 2 diabetes mellitus without complications: Secondary | ICD-10-CM | POA: Diagnosis not present

## 2022-07-09 DIAGNOSIS — G809 Cerebral palsy, unspecified: Secondary | ICD-10-CM | POA: Diagnosis not present

## 2022-07-09 DIAGNOSIS — I10 Essential (primary) hypertension: Secondary | ICD-10-CM | POA: Diagnosis not present

## 2022-07-16 DIAGNOSIS — H409 Unspecified glaucoma: Secondary | ICD-10-CM | POA: Diagnosis not present

## 2022-07-16 DIAGNOSIS — I890 Lymphedema, not elsewhere classified: Secondary | ICD-10-CM | POA: Diagnosis not present

## 2022-07-16 DIAGNOSIS — G40909 Epilepsy, unspecified, not intractable, without status epilepticus: Secondary | ICD-10-CM | POA: Diagnosis not present

## 2022-07-16 DIAGNOSIS — M1712 Unilateral primary osteoarthritis, left knee: Secondary | ICD-10-CM | POA: Diagnosis not present

## 2022-07-16 DIAGNOSIS — G809 Cerebral palsy, unspecified: Secondary | ICD-10-CM | POA: Diagnosis not present

## 2022-07-16 DIAGNOSIS — M109 Gout, unspecified: Secondary | ICD-10-CM | POA: Diagnosis not present

## 2022-07-16 DIAGNOSIS — G8929 Other chronic pain: Secondary | ICD-10-CM | POA: Diagnosis not present

## 2022-07-16 DIAGNOSIS — I10 Essential (primary) hypertension: Secondary | ICD-10-CM | POA: Diagnosis not present

## 2022-07-16 DIAGNOSIS — E119 Type 2 diabetes mellitus without complications: Secondary | ICD-10-CM | POA: Diagnosis not present

## 2022-07-26 DIAGNOSIS — E119 Type 2 diabetes mellitus without complications: Secondary | ICD-10-CM | POA: Diagnosis not present

## 2022-07-26 DIAGNOSIS — I890 Lymphedema, not elsewhere classified: Secondary | ICD-10-CM | POA: Diagnosis not present

## 2022-07-26 DIAGNOSIS — G809 Cerebral palsy, unspecified: Secondary | ICD-10-CM | POA: Diagnosis not present

## 2022-07-26 DIAGNOSIS — M1712 Unilateral primary osteoarthritis, left knee: Secondary | ICD-10-CM | POA: Diagnosis not present

## 2022-07-26 DIAGNOSIS — H409 Unspecified glaucoma: Secondary | ICD-10-CM | POA: Diagnosis not present

## 2022-07-26 DIAGNOSIS — G8929 Other chronic pain: Secondary | ICD-10-CM | POA: Diagnosis not present

## 2022-07-26 DIAGNOSIS — G40909 Epilepsy, unspecified, not intractable, without status epilepticus: Secondary | ICD-10-CM | POA: Diagnosis not present

## 2022-07-26 DIAGNOSIS — M109 Gout, unspecified: Secondary | ICD-10-CM | POA: Diagnosis not present

## 2022-07-26 DIAGNOSIS — I10 Essential (primary) hypertension: Secondary | ICD-10-CM | POA: Diagnosis not present

## 2022-07-30 DIAGNOSIS — I890 Lymphedema, not elsewhere classified: Secondary | ICD-10-CM | POA: Diagnosis not present

## 2022-07-30 DIAGNOSIS — G8929 Other chronic pain: Secondary | ICD-10-CM | POA: Diagnosis not present

## 2022-07-30 DIAGNOSIS — M109 Gout, unspecified: Secondary | ICD-10-CM | POA: Diagnosis not present

## 2022-07-30 DIAGNOSIS — M1712 Unilateral primary osteoarthritis, left knee: Secondary | ICD-10-CM | POA: Diagnosis not present

## 2022-07-30 DIAGNOSIS — E119 Type 2 diabetes mellitus without complications: Secondary | ICD-10-CM | POA: Diagnosis not present

## 2022-07-30 DIAGNOSIS — I10 Essential (primary) hypertension: Secondary | ICD-10-CM | POA: Diagnosis not present

## 2022-07-30 DIAGNOSIS — H409 Unspecified glaucoma: Secondary | ICD-10-CM | POA: Diagnosis not present

## 2022-07-30 DIAGNOSIS — G809 Cerebral palsy, unspecified: Secondary | ICD-10-CM | POA: Diagnosis not present

## 2022-07-30 DIAGNOSIS — G40909 Epilepsy, unspecified, not intractable, without status epilepticus: Secondary | ICD-10-CM | POA: Diagnosis not present

## 2022-08-20 DIAGNOSIS — I350 Nonrheumatic aortic (valve) stenosis: Secondary | ICD-10-CM | POA: Diagnosis not present

## 2022-08-20 DIAGNOSIS — R634 Abnormal weight loss: Secondary | ICD-10-CM | POA: Diagnosis not present

## 2022-08-20 DIAGNOSIS — G809 Cerebral palsy, unspecified: Secondary | ICD-10-CM | POA: Diagnosis not present

## 2022-08-20 DIAGNOSIS — Z9181 History of falling: Secondary | ICD-10-CM | POA: Diagnosis not present

## 2022-08-20 DIAGNOSIS — I700 Atherosclerosis of aorta: Secondary | ICD-10-CM | POA: Diagnosis not present

## 2022-08-20 DIAGNOSIS — E119 Type 2 diabetes mellitus without complications: Secondary | ICD-10-CM | POA: Diagnosis not present

## 2022-08-20 DIAGNOSIS — I10 Essential (primary) hypertension: Secondary | ICD-10-CM | POA: Diagnosis not present

## 2022-08-20 DIAGNOSIS — E1169 Type 2 diabetes mellitus with other specified complication: Secondary | ICD-10-CM | POA: Diagnosis not present

## 2022-08-20 DIAGNOSIS — E7800 Pure hypercholesterolemia, unspecified: Secondary | ICD-10-CM | POA: Diagnosis not present

## 2022-08-20 DIAGNOSIS — I428 Other cardiomyopathies: Secondary | ICD-10-CM | POA: Diagnosis not present

## 2022-09-02 ENCOUNTER — Ambulatory Visit (HOSPITAL_COMMUNITY): Payer: Medicare PPO | Attending: Cardiovascular Disease

## 2022-09-06 DIAGNOSIS — R443 Hallucinations, unspecified: Secondary | ICD-10-CM | POA: Diagnosis not present

## 2022-09-06 DIAGNOSIS — R413 Other amnesia: Secondary | ICD-10-CM | POA: Diagnosis not present

## 2022-09-06 DIAGNOSIS — Z9181 History of falling: Secondary | ICD-10-CM | POA: Diagnosis not present

## 2022-09-10 ENCOUNTER — Encounter (HOSPITAL_COMMUNITY): Payer: Self-pay | Admitting: Cardiovascular Disease

## 2022-09-11 DIAGNOSIS — H401133 Primary open-angle glaucoma, bilateral, severe stage: Secondary | ICD-10-CM | POA: Diagnosis not present

## 2022-09-11 DIAGNOSIS — H16223 Keratoconjunctivitis sicca, not specified as Sjogren's, bilateral: Secondary | ICD-10-CM | POA: Diagnosis not present

## 2022-09-11 DIAGNOSIS — H43811 Vitreous degeneration, right eye: Secondary | ICD-10-CM | POA: Diagnosis not present

## 2022-09-16 ENCOUNTER — Other Ambulatory Visit: Payer: Self-pay

## 2022-09-16 DIAGNOSIS — I350 Nonrheumatic aortic (valve) stenosis: Secondary | ICD-10-CM

## 2022-09-16 DIAGNOSIS — I10 Essential (primary) hypertension: Secondary | ICD-10-CM

## 2022-09-16 DIAGNOSIS — E782 Mixed hyperlipidemia: Secondary | ICD-10-CM

## 2022-09-19 ENCOUNTER — Encounter: Payer: Self-pay | Admitting: Podiatry

## 2022-09-19 ENCOUNTER — Ambulatory Visit: Payer: Medicare PPO | Admitting: Podiatry

## 2022-09-19 DIAGNOSIS — B351 Tinea unguium: Secondary | ICD-10-CM

## 2022-09-19 DIAGNOSIS — M79675 Pain in left toe(s): Secondary | ICD-10-CM

## 2022-09-19 DIAGNOSIS — M79674 Pain in right toe(s): Secondary | ICD-10-CM | POA: Diagnosis not present

## 2022-09-19 NOTE — Progress Notes (Signed)
This patient presents  to my office for at risk foot care.  This patient requires this care by a professional since this patient will be at risk due to having diabetes with no complications. This patient is unable to cut nails herself since the patient cannot reach her nails.These nails are painful walking and wearing shoes.  This patient presents for at risk foot care today.  General Appearance  Alert, conversant and in no acute stress.  Vascular  Dorsalis pedis and posterior tibial  pulses are palpable  bilaterally.  Capillary return is within normal limits  bilaterally. Temperature is within normal limits  bilaterally.  Neurologic  Senn-Weinstein monofilament wire test within normal limits  bilaterally. Muscle power within normal limits bilaterally.  Nails Thick disfigured discolored nails with subungual debris  from hallux to fifth toes bilaterally. No evidence of bacterial infection or drainage bilaterally.  Orthopedic  No limitations of motion  feet .  No crepitus or effusions noted.  No bony pathology or digital deformities noted.  Skin  normotropic skin with no porokeratosis noted bilaterally.  No signs of infections or ulcers noted.     Onychomycosis  Pain in right toes  Pain in left toes    Consent was obtained for treatment procedures.   Mechanical debridement of nails 1-5  bilaterally performed with a nail nipper.  Filed with dremel without incident.    Return office visit  3  months                    Told patient to return for periodic foot care and evaluation due to potential at risk complications.   Helane Gunther DPM

## 2022-10-08 ENCOUNTER — Ambulatory Visit (HOSPITAL_COMMUNITY): Payer: Medicare PPO | Attending: Cardiology

## 2022-10-08 DIAGNOSIS — I350 Nonrheumatic aortic (valve) stenosis: Secondary | ICD-10-CM | POA: Diagnosis not present

## 2022-10-08 DIAGNOSIS — E782 Mixed hyperlipidemia: Secondary | ICD-10-CM | POA: Diagnosis not present

## 2022-10-08 DIAGNOSIS — I10 Essential (primary) hypertension: Secondary | ICD-10-CM

## 2022-10-08 LAB — ECHOCARDIOGRAM COMPLETE
AR max vel: 1.05 cm2
AV Area VTI: 1.07 cm2
AV Area mean vel: 1.09 cm2
AV Mean grad: 18 mmHg
AV Peak grad: 28.7 mmHg
Ao pk vel: 2.68 m/s
Area-P 1/2: 2.22 cm2
S' Lateral: 3.1 cm

## 2022-10-17 ENCOUNTER — Encounter: Payer: Self-pay | Admitting: *Deleted

## 2022-11-08 ENCOUNTER — Encounter: Payer: Self-pay | Admitting: Neurology

## 2022-11-08 ENCOUNTER — Ambulatory Visit: Payer: Medicare PPO | Admitting: Neurology

## 2022-11-08 VITALS — BP 153/66 | HR 77 | Ht 65.5 in | Wt 133.2 lb

## 2022-11-08 DIAGNOSIS — R296 Repeated falls: Secondary | ICD-10-CM | POA: Diagnosis not present

## 2022-11-08 DIAGNOSIS — M48061 Spinal stenosis, lumbar region without neurogenic claudication: Secondary | ICD-10-CM | POA: Diagnosis not present

## 2022-11-08 DIAGNOSIS — G40009 Localization-related (focal) (partial) idiopathic epilepsy and epileptic syndromes with seizures of localized onset, not intractable, without status epilepticus: Secondary | ICD-10-CM | POA: Diagnosis not present

## 2022-11-08 DIAGNOSIS — R29898 Other symptoms and signs involving the musculoskeletal system: Secondary | ICD-10-CM

## 2022-11-08 MED ORDER — GABAPENTIN 300 MG PO CAPS: ORAL_CAPSULE | ORAL | 3 refills | Status: DC

## 2022-11-08 MED ORDER — CARBAMAZEPINE 200 MG PO TABS: 200.0000 mg | ORAL_TABLET | Freq: Three times a day (TID) | ORAL | 3 refills | Status: DC

## 2022-11-08 NOTE — Patient Instructions (Addendum)
Good to see you.  Referral will be sent for home physical therapy  2. Continue all your medications  3. Follow-up in 6 months, call for any changes   Seizure Precautions: 1. If medication has been prescribed for you to prevent seizures, take it exactly as directed.  Do not stop taking the medicine without talking to your doctor first, even if you have not had a seizure in a long time.   2. Avoid activities in which a seizure would cause danger to yourself or to others.  Don't operate dangerous machinery, swim alone, or climb in high or dangerous places, such as on ladders, roofs, or girders.  Do not drive unless your doctor says you may.  3. If you have any warning that you may have a seizure, lay down in a safe place where you can't hurt yourself.    4.  No driving for 6 months from last seizure, as per [facility].   Please refer to the following link on the Epile[employer] Foundation of [employer]'s website for more information: http://www.epilepsyfoundation.org/answerplace/Social/driving/drivingu.cfm   5.  Maintain good sleep hygiene.  6.  Contact your doctor if you have any problems that may be related to the medicine you are taking.  7.  Call 911 and bring the patient back to the ED if:        A.  The seizure lasts longer than 5 minutes.       B.  The patient doesn't awaken shortly after the seizure  C.  The patient has new problems such as difficulty seeing, speaking or moving  D.  The patient was injured during the seizure  E.  The patient has a temperature over 102 F (39C)  F.  The patient vomited and now is having trouble breathing

## 2022-11-08 NOTE — Progress Notes (Unsigned)
NEUROLOGY FOLLOW UP OFFICE NOTE  Donna Becker [id number] 1945/11/09  HISTORY OF PRESENT ILLNESS: I had the pleasure of seeing Donna Becker in follow-up in the neurology clinic on 11/08/2022.  The patient was last seen 7 months ago for seizures and dizziness. She is again accompanied by her sister [name] who helps supplement the history today.  Records and images were personally reviewed where available.  On last visit, they were reporting more falls and cognitive changes since starting Gabapentin for back pain, dose reduced from 300mg  TID to 300mg  at bedtime. Since then, she was at her PCP office in June due to hallucinations, seeing bugs on the ground. There was some failure to thrive and cognition issues. She had a UA that was slightly abnormal and treated with antibiotics for 3 days.  Last fall was last month, no loc No staring, jerking Better cognition with lower dose; no change in falls No more halluc Memory fine Taking own meds, not forgetting No HAs, no dizziness Left used to be good side but arm and leg are numb; weaker CP on right hand, atrophy Unable to abduct left arm, unable to extend right wrist Right foot drop, drags left foot when walking Not doing PT anymore Sleeping okay Mood fine; ok per [name] comes and goes No concerns about memory today Indep with dressing and bathing; takes my time Other sister manages finances, brother does food  I had the pleasure of seeing Donna Becker in follow-up in the neurology clinic on 04/10/2022.  The patient was last seen 7 months ago for seizures and dizziness. She is again accompanied by her sister [name] who helps supplement the history today. Records and images were personally reviewed where available. Since her last visit, she and [name] continue to deny any seizures since 2021 on carbamazepine 200mg  TID, no side effects. She lives with her brother. [name] comes daily. They have not seen any staring/unresponsive episodes. She denies  any headaches, dizziness. Main concern continues to be back pain, she has seen Neurosurgery and started injections but continues to have pain. She was started on Gabapentin, currently on 300mg  TID, however [name] notes that since starting Gabapentin, she has had more falls and cognitive changes. Last fall was 2 weeks ago, she messed up her face. She states her knee is cracking, she has started doing PT. She notes the Gabapentin helps her sleep at night.    History on Initial Assessment 10/28/2019: This is a 77 year old left-handed woman with a history of hypertension, hyperlipidemia, diabetes, breast cancer with left arm lymphedema, cerebral palsy, seizures, presenting for evaluation of seizures, dizziness. Records from [facility] were reviewed, her last visit with epileptologist Dr. Sherlean Foot was in 2011. She was born with right-sided cerebral palsy and started having seizures as a teenager. 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 She also had episodes of staring/confusion lasting up to 15-20 minutes. She was previously on phenobarbital and nitrofurantoin, then has been on Tegretol for many years. In March 2011, she had an episode of confusion and odd behavior followed by prolonged period of sleepiness. MRI/MRA brain no acute changes. She did not tolerate increasing dose of Tegretol, at that time Levetiracetam 500mg  BID was added. On her visit in 2011, she reported frequent falls, due to left knee pain, she would fall because her right leg cannot support her. She states the doctor at [facility] gave her a white pill and the pain in her left leg was better. She reports the  vessels in her left leg pops up, she has compression stockings which are uncomfortable. She is not taking the unrecalled medication since she moved to [place] more than 10 years ago. Gabapentin is on her medication list on [facility], however she does not think she is taking it. It is not on the medication list from Dr.  Idelle Crouch last office visit note. She denies any seizures for many, many years. She manages her own medications and states she takes them religiously. She lives with her brother who has not mentioned any staring/unresponsive episodes. She denies any gaps in time, olfactory/gustatory hallucinations, myoclonic jerks. She takes carbamazepine 200mg  1 tab in AM, 2 tabs in PM without side effects.  In April, her sister called Dr. Nehemiah Settle to report sudden onset fainting episodes at home. She fainted and fell suddenly while walking at home, then had another event 2 weeks later. She is alone in the office today and denies any episodes of loss of consciousness. Notes from Dr. Nehemiah Settle were reviewed, she was reporting feeling weak, dizzy, like the room is dizzy. Symptoms typically occur when changing position. Orthostatic vital signs were negative at her visit. Head CT was ordered but she did not do the test. She states the dizziness is not as much as it was. She has to be very careful with how she moves, she would be in bed and try to move then start spinning. No nausea/vomiting, this does not occur daily. She has had falls but is unsure if they are associated with the dizziness. She reports frequent falls, last fall was last Monday, she had her cane when she fell. She has to be very careful, before she knows it, she is on the floor, no loss of consciousness. She reports her left leg is so painful. Due to her right sided CP, she has always depended on the left side, but now it is "not my side anymore." She was previously taking aspirin for the pain but stopped since ineffective. She has left-sided back pain and neck pain down her left shoulder. No headaches, diplopia, dysarthria/dysphagia. She reports her last HbA1c was okay. Her last PT session was 2 years ago where she was given exercises for left leg pain, she continues to do them.   MRI lumbar spine in 2012 showed degenerative anterolisthesis and uncovering of the disc at  L4-5 and L5-S1; Right greater than left lateral recess narrowing at L4-5; Mild lateral recess narrowing bilaterally at L5-S1; Prominent epidural fat at L4-5 and L5-S1 compatible with epidural lipomatosis; Mild facet hypertrophy L3-4 without significant stenosis.   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 There is no history of febrile convulsions, CNS infections such as meningitis/encephalitis, significant traumatic brain injury, neurosurgical procedures, or family history of seizures.  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 No acute change.   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   MRI lumbar spine without contrast 02/2021 showed increased chronic grade 1 anterolisthesis at L4-L5 and L5-S1 with disc and advanced posterior element degeneration, increased severe spinal stenosis at L4-L5, with severe lateral recess involvement, query left L5 radiculitis. There was moderate left and mild right L5 foraminal stenosis, mild left L2 foraminal stenosis. There was note of a  bulky chronic left renal calculus suspected at the left renal pelvis, 9mm.   PAST MEDICAL HISTORY: Past Medical History:  Diagnosis Date   Arthritis    Cancer (HCC)    left breast cancer /with retained lymph edema left arm   Cerebral palsy, hemiplegic (HCC)    right sided weakness- ambulates with walker   Diabetes mellitus without complication (HCC)    Glaucoma    Gout    History of shingles    Hypertension    Neuromuscular disorder (HCC)    neuropathy, right sided paresis, left arm numbness   Seizure (HCC)    Seizures (HCC)    Transfusion  history    '90's when had breast cancer surgery    MEDICATIONS: Current Outpatient Medications on File Prior to Visit  Medication Sig Dispense Refill   allopurinol (ZYLOPRIM) 100 MG tablet Take 100 mg by mouth daily.     B Complex-C (B-COMPLEX WITH VITAMIN C) tablet Take 1 tablet by mouth daily.     bimatoprost (LUMIGAN) 0.01 % SOLN Place 1 drop into both eyes at bedtime.     carbamazepine (TEGRETOL) 200 MG tablet Take 1 tablet (200 mg total) by mouth 3 (three) times daily. 270 tablet 3   carvedilol (COREG) 25 MG tablet TAKE ONE TABLET BY MOUTH TWICE DAILY Needs appointment for further refills 60 tablet 3   colchicine 0.6 MG tablet Take 0.6 mg by mouth daily.     furosemide (LASIX) 20 MG tablet Take 20 mg by mouth every morning.     gabapentin (NEURONTIN) 300 MG capsule Take 1 capsule at night 90 capsule 3   glipiZIDE (GLUCOTROL XL) 10 MG 24 hr tablet Take 10 mg by mouth daily.     hydrALAZINE (APRESOLINE) 100 MG tablet Take 100 mg by mouth 2 (two) times daily.     losartan (COZAAR) 100 MG tablet Take 100 mg by mouth every evening.     metFORMIN (GLUCOPHAGE) 500 MG tablet Take 500 mg by mouth 2 (two) times daily with a meal.     Multiple Vitamin (MULTIVITAMIN WITH MINERALS) TABS tablet Take 1 tablet by mouth daily.     NIFEdipine (ADALAT CC) 90 MG 24 hr tablet Take 1 tablet (90 mg total) by mouth daily. 30 tablet 8   pravastatin (PRAVACHOL) 40 MG tablet Take 40 mg by mouth every evening.     Probiotic Product (PROBIOTIC DAILY) CAPS Take 1 capsule by mouth daily.     No current facility-administered medications on file prior to visit.    ALLERGIES: Allergies  Allergen Reactions   Codeine Itching and Rash    FAMILY HISTORY: Family History  Problem Relation Age of Onset   Hypertension Mother    Arthritis Sister     SOCIAL HISTORY: Social History   Socioeconomic History   Marital status: Single    Spouse name: Not on file   Number of children: Not on file   Years of  education: Not on file   Highest education level: Not on file  Occupational History   Occupation: retired Comptroller  Tobacco Use   Smoking status: Former    Current packs/day: 0.00    Types: Cigarettes    Start date: 01/07/1968    Quit date: 01/06/1969    Years since quitting: 53.8   Smokeless tobacco: Never  Vaping Use   Vaping status: Never Used  Substance and Sexual Activity   Alcohol use: No   Drug use: No   Sexual activity: Not Currently  Other Topics Concern   Not on file  Social History Narrative   Left Handed   One Story Home   No caffeine   Daughter is with her today   Social Determinants of Health   Financial Resource Strain: Not on file  Food Insecurity: Not on file  Transportation Needs: Not on file  Physical Activity: Not on file  Stress: Not on file  Social Connections: Not on file  Intimate Partner Violence: Not on file     PHYSICAL EXAM: There were no vitals filed for this visit. General: No acute distress Head:  Normocephalic/atraumatic Skin/Extremities: No rash, no edema Neurological Exam: alert and oriented to person, place, and time. No aphasia or dysarthria. Fund of knowledge is appropriate.  Recent and remote memory are intact.  Attention and concentration are normal.   Cranial nerves: Pupils equal, round. Extraocular movements intact with no nystagmus. Visual fields full.  No facial asymmetry.  Motor: Bulk and tone normal, muscle strength 5/5 throughout with no pronator drift.   Finger to nose testing intact.  Gait narrow-based and steady, able to tandem walk adequately.  Romberg negative.  Recent and remote memory are impaired, 1/3 delayed recall. Attention and concentration are normal.   Cranial nerves: Pupils equal, round. Extraocular movements intact with no nystagmus. Visual fields full.  No facial asymmetry.  Motor: atrophy on muscles of right hand. Increased tone on right UE and LE, 5/5 proximal right UE, 3/5 distal right UE, 4/5 right hip  flexion, 4/5 left UE, 3/5 left hip flexion. Mild ataxia on right finger to nose (similar to prior). Gait not tested.   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 They continue to deny any seizures since 2021 on carbamazepine 200mg  TID. Main concern continues to be back pain, she was started on Gabapentin but sister notes more falls and memory changes. She feels it helps with sleep. Reduce to 300mg  qhs. Continue follow-up with Neurosurgery for back pain. She does not drive. Follow-up in 6 months, call for any changes.       Thank you for allowing me to participate in *** care.  Please do not hesitate to call for any questions or concerns.  The duration of this appointment visit was *** minutes of face-to-face time with the patient.  Greater than 50% of this time was spent in counseling, explanation of diagnosis, planning of further management, and coordination of care.   Patrcia Dolly, M.D.   CC: ***

## 2022-11-12 DIAGNOSIS — G40009 Localization-related (focal) (partial) idiopathic epilepsy and epileptic syndromes with seizures of localized onset, not intractable, without status epilepticus: Secondary | ICD-10-CM | POA: Diagnosis not present

## 2022-11-12 DIAGNOSIS — M48061 Spinal stenosis, lumbar region without neurogenic claudication: Secondary | ICD-10-CM | POA: Diagnosis not present

## 2022-11-12 DIAGNOSIS — E119 Type 2 diabetes mellitus without complications: Secondary | ICD-10-CM | POA: Diagnosis not present

## 2022-11-12 DIAGNOSIS — I890 Lymphedema, not elsewhere classified: Secondary | ICD-10-CM | POA: Diagnosis not present

## 2022-11-12 DIAGNOSIS — I10 Essential (primary) hypertension: Secondary | ICD-10-CM | POA: Diagnosis not present

## 2022-11-12 DIAGNOSIS — H409 Unspecified glaucoma: Secondary | ICD-10-CM | POA: Diagnosis not present

## 2022-11-12 DIAGNOSIS — M1990 Unspecified osteoarthritis, unspecified site: Secondary | ICD-10-CM | POA: Diagnosis not present

## 2022-11-12 DIAGNOSIS — M109 Gout, unspecified: Secondary | ICD-10-CM | POA: Diagnosis not present

## 2022-11-12 DIAGNOSIS — G802 Spastic hemiplegic cerebral palsy: Secondary | ICD-10-CM | POA: Diagnosis not present

## 2022-11-13 ENCOUNTER — Telehealth: Payer: Self-pay | Admitting: Neurology

## 2022-11-13 NOTE — Telephone Encounter (Signed)
Donna Becker from [facility] called needing home health Pt orders "once a week for 9 weeks" and Ot eval  Can reach her at [phone]

## 2022-11-13 NOTE — Telephone Encounter (Signed)
Donna Becker from [facility] was  called no answer left a message with home health Pt orders "once a week for 9 weeks" and Ot eval

## 2022-11-19 DIAGNOSIS — I890 Lymphedema, not elsewhere classified: Secondary | ICD-10-CM | POA: Diagnosis not present

## 2022-11-19 DIAGNOSIS — E119 Type 2 diabetes mellitus without complications: Secondary | ICD-10-CM | POA: Diagnosis not present

## 2022-11-19 DIAGNOSIS — M1990 Unspecified osteoarthritis, unspecified site: Secondary | ICD-10-CM | POA: Diagnosis not present

## 2022-11-19 DIAGNOSIS — H409 Unspecified glaucoma: Secondary | ICD-10-CM | POA: Diagnosis not present

## 2022-11-19 DIAGNOSIS — G802 Spastic hemiplegic cerebral palsy: Secondary | ICD-10-CM | POA: Diagnosis not present

## 2022-11-19 DIAGNOSIS — G40009 Localization-related (focal) (partial) idiopathic epilepsy and epileptic syndromes with seizures of localized onset, not intractable, without status epilepticus: Secondary | ICD-10-CM | POA: Diagnosis not present

## 2022-11-19 DIAGNOSIS — M109 Gout, unspecified: Secondary | ICD-10-CM | POA: Diagnosis not present

## 2022-11-19 DIAGNOSIS — M48061 Spinal stenosis, lumbar region without neurogenic claudication: Secondary | ICD-10-CM | POA: Diagnosis not present

## 2022-11-19 DIAGNOSIS — I10 Essential (primary) hypertension: Secondary | ICD-10-CM | POA: Diagnosis not present

## 2022-11-20 ENCOUNTER — Other Ambulatory Visit (HOSPITAL_COMMUNITY): Payer: Self-pay

## 2022-11-20 MED ORDER — FUROSEMIDE 20 MG PO TABS
20.0000 mg | ORAL_TABLET | Freq: Every day | ORAL | 3 refills | Status: DC
  Filled 2022-11-20 – 2022-11-22 (×2): qty 30, 30d supply, fill #0
  Filled 2022-12-19 – 2022-12-20 (×4): qty 30, 30d supply, fill #1
  Filled 2023-01-13: qty 30, 30d supply, fill #2
  Filled 2023-02-17: qty 30, 30d supply, fill #3
  Filled 2023-03-12 – 2023-03-13 (×2): qty 30, 30d supply, fill #4
  Filled 2023-03-31 – 2023-04-08 (×2): qty 30, 30d supply, fill #5
  Filled 2023-04-29 – 2023-05-01 (×2): qty 30, 30d supply, fill #6
  Filled 2023-05-19 – 2023-05-27 (×2): qty 30, 30d supply, fill #7
  Filled 2023-06-04 – 2023-06-26 (×4): qty 30, 30d supply, fill #8
  Filled 2023-07-14 – 2023-07-22 (×2): qty 30, 30d supply, fill #9
  Filled 2023-08-11 – 2023-08-14 (×2): qty 30, 30d supply, fill #10
  Filled 2023-09-30: qty 30, 30d supply, fill #11

## 2022-11-20 MED ORDER — ALLOPURINOL 100 MG PO TABS
100.0000 mg | ORAL_TABLET | Freq: Every morning | ORAL | 3 refills | Status: DC
  Filled 2022-11-20 – 2022-11-22 (×2): qty 30, 30d supply, fill #0
  Filled 2022-12-19 – 2022-12-20 (×4): qty 30, 30d supply, fill #1
  Filled 2023-01-13: qty 30, 30d supply, fill #2
  Filled 2023-02-17: qty 30, 30d supply, fill #3
  Filled 2023-03-12 – 2023-03-13 (×2): qty 30, 30d supply, fill #4
  Filled 2023-03-31 – 2023-04-08 (×2): qty 30, 30d supply, fill #5
  Filled 2023-04-29 – 2023-05-01 (×2): qty 30, 30d supply, fill #6
  Filled 2023-05-19 – 2023-05-27 (×2): qty 30, 30d supply, fill #7
  Filled 2023-06-04 – 2023-06-26 (×4): qty 30, 30d supply, fill #8
  Filled 2023-07-14 – 2023-07-22 (×2): qty 30, 30d supply, fill #9
  Filled 2023-08-11 – 2023-08-14 (×2): qty 30, 30d supply, fill #10
  Filled 2023-09-30: qty 30, 30d supply, fill #11

## 2022-11-20 MED ORDER — CARBAMAZEPINE 200 MG PO TABS
200.0000 mg | ORAL_TABLET | Freq: Three times a day (TID) | ORAL | 3 refills | Status: DC
  Filled 2022-11-20: qty 90, 30d supply, fill #0
  Filled 2022-11-22: qty 270, 90d supply, fill #0
  Filled 2023-02-13: qty 270, 90d supply, fill #1
  Filled 2023-03-12 – 2023-05-01 (×4): qty 270, 90d supply, fill #2

## 2022-11-20 MED ORDER — HYDRALAZINE HCL 100 MG PO TABS
100.0000 mg | ORAL_TABLET | Freq: Two times a day (BID) | ORAL | 3 refills | Status: DC
  Filled 2022-11-20 – 2022-11-22 (×2): qty 60, 30d supply, fill #0
  Filled 2022-12-19 – 2022-12-20 (×4): qty 60, 30d supply, fill #1
  Filled 2023-01-13: qty 60, 30d supply, fill #2
  Filled 2023-02-17: qty 60, 30d supply, fill #3
  Filled 2023-03-12 – 2023-03-13 (×2): qty 60, 30d supply, fill #4
  Filled 2023-03-31 – 2023-04-08 (×2): qty 60, 30d supply, fill #5
  Filled 2023-04-29 – 2023-05-01 (×2): qty 60, 30d supply, fill #6
  Filled 2023-05-19 – 2023-05-27 (×2): qty 60, 30d supply, fill #7
  Filled 2023-06-04 – 2023-06-26 (×4): qty 60, 30d supply, fill #8
  Filled 2023-07-14 – 2023-07-22 (×2): qty 60, 30d supply, fill #9
  Filled 2023-08-11 – 2023-08-14 (×2): qty 60, 30d supply, fill #10
  Filled 2023-09-30: qty 60, 30d supply, fill #11

## 2022-11-20 MED ORDER — PRAVASTATIN SODIUM 20 MG PO TABS
20.0000 mg | ORAL_TABLET | Freq: Every day | ORAL | 3 refills | Status: DC
  Filled 2022-11-20 – 2022-11-22 (×2): qty 30, 30d supply, fill #0
  Filled 2022-12-19 – 2022-12-20 (×4): qty 30, 30d supply, fill #1
  Filled 2023-01-13: qty 30, 30d supply, fill #2
  Filled 2023-02-17: qty 30, 30d supply, fill #3
  Filled 2023-03-12 – 2023-03-13 (×2): qty 30, 30d supply, fill #4
  Filled 2023-03-31 – 2023-04-08 (×2): qty 30, 30d supply, fill #5
  Filled 2023-04-29 – 2023-05-01 (×2): qty 30, 30d supply, fill #6
  Filled 2023-05-19 – 2023-05-27 (×2): qty 30, 30d supply, fill #7
  Filled 2023-06-04 – 2023-06-26 (×4): qty 30, 30d supply, fill #8
  Filled 2023-07-14 – 2023-07-22 (×2): qty 30, 30d supply, fill #9
  Filled 2023-08-14: qty 30, 30d supply, fill #10
  Filled 2023-09-30: qty 30, 30d supply, fill #11

## 2022-11-20 MED ORDER — NIFEDIPINE ER 90 MG PO TB24
90.0000 mg | ORAL_TABLET | Freq: Every day | ORAL | 3 refills | Status: DC
  Filled 2022-11-20 – 2022-11-22 (×2): qty 30, 30d supply, fill #0
  Filled 2022-12-19 – 2022-12-20 (×4): qty 30, 30d supply, fill #1
  Filled 2023-01-13: qty 30, 30d supply, fill #2
  Filled 2023-02-17: qty 30, 30d supply, fill #3
  Filled 2023-03-12 – 2023-03-13 (×2): qty 30, 30d supply, fill #4
  Filled 2023-03-31 – 2023-04-08 (×2): qty 30, 30d supply, fill #5
  Filled 2023-04-29 – 2023-05-01 (×2): qty 30, 30d supply, fill #6
  Filled 2023-05-19 – 2023-05-27 (×2): qty 30, 30d supply, fill #7
  Filled 2023-06-04 – 2023-06-26 (×4): qty 30, 30d supply, fill #8
  Filled 2023-07-14 – 2023-07-22 (×2): qty 30, 30d supply, fill #9
  Filled 2023-08-11 – 2023-08-14 (×2): qty 30, 30d supply, fill #10
  Filled 2023-09-30: qty 30, 30d supply, fill #11

## 2022-11-20 MED ORDER — CARVEDILOL 25 MG PO TABS
25.0000 mg | ORAL_TABLET | Freq: Two times a day (BID) | ORAL | 3 refills | Status: DC
Start: 2022-11-20 — End: 2023-10-15
  Filled 2022-11-20 – 2022-11-22 (×2): qty 60, 30d supply, fill #0
  Filled 2022-12-19 – 2022-12-20 (×4): qty 60, 30d supply, fill #1
  Filled 2023-01-13: qty 60, 30d supply, fill #2
  Filled 2023-02-17: qty 60, 30d supply, fill #3
  Filled 2023-03-12 – 2023-03-13 (×2): qty 60, 30d supply, fill #4
  Filled 2023-03-31 – 2023-04-08 (×2): qty 60, 30d supply, fill #5
  Filled 2023-04-29 – 2023-05-01 (×2): qty 60, 30d supply, fill #6
  Filled 2023-05-19 – 2023-05-27 (×2): qty 60, 30d supply, fill #7
  Filled 2023-06-04 – 2023-06-26 (×4): qty 60, 30d supply, fill #8
  Filled 2023-07-14 – 2023-07-22 (×2): qty 60, 30d supply, fill #9
  Filled 2023-08-11 – 2023-08-14 (×2): qty 60, 30d supply, fill #10
  Filled 2023-09-30: qty 60, 30d supply, fill #11

## 2022-11-21 ENCOUNTER — Other Ambulatory Visit (HOSPITAL_COMMUNITY): Payer: Self-pay

## 2022-11-21 ENCOUNTER — Other Ambulatory Visit: Payer: Self-pay

## 2022-11-21 DIAGNOSIS — M109 Gout, unspecified: Secondary | ICD-10-CM | POA: Diagnosis not present

## 2022-11-21 DIAGNOSIS — E119 Type 2 diabetes mellitus without complications: Secondary | ICD-10-CM | POA: Diagnosis not present

## 2022-11-21 DIAGNOSIS — G802 Spastic hemiplegic cerebral palsy: Secondary | ICD-10-CM | POA: Diagnosis not present

## 2022-11-21 DIAGNOSIS — H409 Unspecified glaucoma: Secondary | ICD-10-CM | POA: Diagnosis not present

## 2022-11-21 DIAGNOSIS — I10 Essential (primary) hypertension: Secondary | ICD-10-CM | POA: Diagnosis not present

## 2022-11-21 DIAGNOSIS — M48061 Spinal stenosis, lumbar region without neurogenic claudication: Secondary | ICD-10-CM | POA: Diagnosis not present

## 2022-11-21 DIAGNOSIS — I890 Lymphedema, not elsewhere classified: Secondary | ICD-10-CM | POA: Diagnosis not present

## 2022-11-21 DIAGNOSIS — G40009 Localization-related (focal) (partial) idiopathic epilepsy and epileptic syndromes with seizures of localized onset, not intractable, without status epilepticus: Secondary | ICD-10-CM | POA: Diagnosis not present

## 2022-11-21 DIAGNOSIS — M1990 Unspecified osteoarthritis, unspecified site: Secondary | ICD-10-CM | POA: Diagnosis not present

## 2022-11-22 ENCOUNTER — Emergency Department (HOSPITAL_COMMUNITY): Payer: Medicare PPO

## 2022-11-22 ENCOUNTER — Other Ambulatory Visit: Payer: Self-pay

## 2022-11-22 ENCOUNTER — Other Ambulatory Visit (HOSPITAL_COMMUNITY): Payer: Self-pay

## 2022-11-22 ENCOUNTER — Other Ambulatory Visit (HOSPITAL_BASED_OUTPATIENT_CLINIC_OR_DEPARTMENT_OTHER): Payer: Self-pay

## 2022-11-22 ENCOUNTER — Emergency Department (HOSPITAL_COMMUNITY)
Admission: EM | Admit: 2022-11-22 | Discharge: 2022-11-22 | Disposition: A | Discharge status: Home / Self Care | Payer: Medicare PPO | Attending: Emergency Medicine | Admitting: Emergency Medicine

## 2022-11-22 DIAGNOSIS — Z79899 Other long term (current) drug therapy: Secondary | ICD-10-CM | POA: Diagnosis not present

## 2022-11-22 DIAGNOSIS — R079 Chest pain, unspecified: Secondary | ICD-10-CM | POA: Diagnosis not present

## 2022-11-22 DIAGNOSIS — M50221 Other cervical disc displacement at C4-C5 level: Secondary | ICD-10-CM | POA: Diagnosis not present

## 2022-11-22 DIAGNOSIS — I10 Essential (primary) hypertension: Secondary | ICD-10-CM | POA: Diagnosis not present

## 2022-11-22 DIAGNOSIS — S299XXA Unspecified injury of thorax, initial encounter: Secondary | ICD-10-CM | POA: Diagnosis not present

## 2022-11-22 DIAGNOSIS — Z7984 Long term (current) use of oral hypoglycemic drugs: Secondary | ICD-10-CM | POA: Diagnosis not present

## 2022-11-22 DIAGNOSIS — S0990XA Unspecified injury of head, initial encounter: Secondary | ICD-10-CM | POA: Diagnosis not present

## 2022-11-22 DIAGNOSIS — I672 Cerebral atherosclerosis: Secondary | ICD-10-CM | POA: Diagnosis not present

## 2022-11-22 DIAGNOSIS — S0003XA Contusion of scalp, initial encounter: Secondary | ICD-10-CM | POA: Insufficient documentation

## 2022-11-22 DIAGNOSIS — M5021 Other cervical disc displacement,  high cervical region: Secondary | ICD-10-CM | POA: Diagnosis not present

## 2022-11-22 DIAGNOSIS — M549 Dorsalgia, unspecified: Secondary | ICD-10-CM | POA: Diagnosis not present

## 2022-11-22 DIAGNOSIS — M50222 Other cervical disc displacement at C5-C6 level: Secondary | ICD-10-CM | POA: Diagnosis not present

## 2022-11-22 DIAGNOSIS — I6782 Cerebral ischemia: Secondary | ICD-10-CM | POA: Diagnosis not present

## 2022-11-22 DIAGNOSIS — R0989 Other specified symptoms and signs involving the circulatory and respiratory systems: Secondary | ICD-10-CM | POA: Diagnosis not present

## 2022-11-22 DIAGNOSIS — I700 Atherosclerosis of aorta: Secondary | ICD-10-CM | POA: Diagnosis not present

## 2022-11-22 DIAGNOSIS — S199XXA Unspecified injury of neck, initial encounter: Secondary | ICD-10-CM | POA: Diagnosis not present

## 2022-11-22 DIAGNOSIS — M79662 Pain in left lower leg: Secondary | ICD-10-CM | POA: Diagnosis not present

## 2022-11-22 DIAGNOSIS — M542 Cervicalgia: Secondary | ICD-10-CM | POA: Insufficient documentation

## 2022-11-22 DIAGNOSIS — R29818 Other symptoms and signs involving the nervous system: Secondary | ICD-10-CM | POA: Diagnosis not present

## 2022-11-22 DIAGNOSIS — G9389 Other specified disorders of brain: Secondary | ICD-10-CM | POA: Diagnosis not present

## 2022-11-22 DIAGNOSIS — I69398 Other sequelae of cerebral infarction: Secondary | ICD-10-CM | POA: Diagnosis not present

## 2022-11-22 DIAGNOSIS — Z1152 Encounter for screening for COVID-19: Secondary | ICD-10-CM | POA: Diagnosis not present

## 2022-11-22 DIAGNOSIS — S3993XA Unspecified injury of pelvis, initial encounter: Secondary | ICD-10-CM | POA: Diagnosis not present

## 2022-11-22 DIAGNOSIS — W19XXXA Unspecified fall, initial encounter: Secondary | ICD-10-CM | POA: Diagnosis not present

## 2022-11-22 DIAGNOSIS — E119 Type 2 diabetes mellitus without complications: Secondary | ICD-10-CM | POA: Insufficient documentation

## 2022-11-22 DIAGNOSIS — M1712 Unilateral primary osteoarthritis, left knee: Secondary | ICD-10-CM | POA: Diagnosis not present

## 2022-11-22 DIAGNOSIS — S8992XA Unspecified injury of left lower leg, initial encounter: Secondary | ICD-10-CM | POA: Diagnosis not present

## 2022-11-22 DIAGNOSIS — M47812 Spondylosis without myelopathy or radiculopathy, cervical region: Secondary | ICD-10-CM | POA: Diagnosis not present

## 2022-11-22 DIAGNOSIS — M4802 Spinal stenosis, cervical region: Secondary | ICD-10-CM | POA: Diagnosis not present

## 2022-11-22 DIAGNOSIS — M85862 Other specified disorders of bone density and structure, left lower leg: Secondary | ICD-10-CM | POA: Diagnosis not present

## 2022-11-22 DIAGNOSIS — R9089 Other abnormal findings on diagnostic imaging of central nervous system: Secondary | ICD-10-CM | POA: Diagnosis not present

## 2022-11-22 LAB — DIFFERENTIAL
Abs Immature Granulocytes: 0.01 10*3/uL (ref 0.00–0.07)
Basophils Absolute: 0 10*3/uL (ref 0.0–0.1)
Basophils Relative: 1 %
Eosinophils Absolute: 0.1 10*3/uL (ref 0.0–0.5)
Eosinophils Relative: 3 %
Immature Granulocytes: 0 %
Lymphocytes Relative: 22 %
Lymphs Abs: 0.8 10*3/uL (ref 0.7–4.0)
Monocytes Absolute: 0.4 10*3/uL (ref 0.1–1.0)
Monocytes Relative: 12 %
Neutro Abs: 2.3 10*3/uL (ref 1.7–7.7)
Neutrophils Relative %: 62 %

## 2022-11-22 LAB — COMPREHENSIVE METABOLIC PANEL
ALT: 24 U/L (ref 0–44)
AST: 23 U/L (ref 15–41)
Albumin: 3.3 g/dL — ABNORMAL LOW (ref 3.5–5.0)
Alkaline Phosphatase: 118 U/L (ref 38–126)
Anion gap: 11 (ref 5–15)
BUN: 29 mg/dL — ABNORMAL HIGH (ref 8–23)
CO2: 25 mmol/L (ref 22–32)
Calcium: 9.3 mg/dL (ref 8.9–10.3)
Chloride: 102 mmol/L (ref 98–111)
Creatinine, Ser: 0.77 mg/dL (ref 0.44–1.00)
GFR, Estimated: >60 mL/min (ref >60)
Glucose, Bld: 102 mg/dL — ABNORMAL HIGH (ref 70–99)
Potassium: 3.9 mmol/L (ref 3.5–5.1)
Sodium: 138 mmol/L (ref 135–145)
Total Bilirubin: 0.3 mg/dL (ref 0.3–1.2)
Total Protein: 7.6 g/dL (ref 6.5–8.1)

## 2022-11-22 LAB — CBC
HCT: 35.9 % — ABNORMAL LOW (ref 36.0–46.0)
Hemoglobin: 11.6 g/dL — ABNORMAL LOW (ref 12.0–15.0)
MCH: 31.5 pg (ref 26.0–34.0)
MCHC: 32.3 g/dL (ref 30.0–36.0)
MCV: 97.6 fL (ref 80.0–100.0)
Platelets: 257 10*3/uL (ref 150–400)
RBC: 3.68 MIL/uL — ABNORMAL LOW (ref 3.87–5.11)
RDW: 13.8 % (ref 11.5–15.5)
WBC: 3.6 10*3/uL — ABNORMAL LOW (ref 4.0–10.5)
nRBC: 0 % (ref 0.0–0.2)

## 2022-11-22 LAB — URINALYSIS, ROUTINE W REFLEX MICROSCOPIC
Bilirubin Urine: NEGATIVE
Glucose, UA: NEGATIVE mg/dL
Hgb urine dipstick: NEGATIVE
Ketones, ur: NEGATIVE mg/dL
Leukocytes,Ua: NEGATIVE
Nitrite: NEGATIVE
Protein, ur: 30 mg/dL — AB
Specific Gravity, Urine: 1.012 (ref 1.005–1.030)
pH: 6 (ref 5.0–8.0)

## 2022-11-22 LAB — I-STAT CHEM 8, ED
BUN: 29 mg/dL — ABNORMAL HIGH (ref 8–23)
Calcium, Ion: 1.21 mmol/L (ref 1.15–1.40)
Chloride: 103 mmol/L (ref 98–111)
Creatinine, Ser: 0.8 mg/dL (ref 0.44–1.00)
Glucose, Bld: 106 mg/dL — ABNORMAL HIGH (ref 70–99)
HCT: 36 % (ref 36.0–46.0)
Hemoglobin: 12.2 g/dL (ref 12.0–15.0)
Potassium: 3.9 mmol/L (ref 3.5–5.1)
Sodium: 142 mmol/L (ref 135–145)
TCO2: 27 mmol/L (ref 22–32)

## 2022-11-22 LAB — ETHANOL: Alcohol, Ethyl (B): <10 mg/dL (ref <10)

## 2022-11-22 LAB — PROTIME-INR
INR: 1.1 (ref 0.8–1.2)
Prothrombin Time: 14.4 seconds (ref 11.4–15.2)

## 2022-11-22 LAB — RAPID URINE DRUG SCREEN, HOSP PERFORMED
Amphetamines: NOT DETECTED
Barbiturates: NOT DETECTED
Benzodiazepines: NOT DETECTED
Cocaine: NOT DETECTED
Opiates: NOT DETECTED
Tetrahydrocannabinol: NOT DETECTED

## 2022-11-22 LAB — APTT: aPTT: 30 seconds (ref 24–36)

## 2022-11-22 LAB — SARS CORONAVIRUS 2 BY RT PCR: SARS Coronavirus 2 by RT PCR: NEGATIVE

## 2022-11-22 LAB — I-STAT CG4 LACTIC ACID, ED: Lactic Acid, Venous: 1 mmol/L (ref 0.5–1.9)

## 2022-11-22 MED ORDER — FENTANYL CITRATE PF 50 MCG/ML IJ SOSY
25.0000 ug | PREFILLED_SYRINGE | Freq: Once | INTRAMUSCULAR | Status: AC
  Administered 2022-11-22: 25 ug via INTRAVENOUS
  Filled 2022-11-22: qty 1

## 2022-11-22 MED ORDER — CARVEDILOL 12.5 MG PO TABS
25.0000 mg | ORAL_TABLET | Freq: Once | ORAL | Status: AC
  Administered 2022-11-22: 25 mg via ORAL
  Filled 2022-11-22: qty 2

## 2022-11-22 MED ORDER — ONDANSETRON HCL 4 MG/2ML IJ SOLN
4.0000 mg | Freq: Once | INTRAMUSCULAR | Status: AC
  Administered 2022-11-22: 4 mg via INTRAVENOUS
  Filled 2022-11-22: qty 2

## 2022-11-22 MED ORDER — HYDRALAZINE HCL 25 MG PO TABS
100.0000 mg | ORAL_TABLET | Freq: Once | ORAL | Status: AC
  Administered 2022-11-22: 100 mg via ORAL
  Filled 2022-11-22: qty 4

## 2022-11-22 NOTE — ED Notes (Signed)
Pt linen, gown, and brief changed. Patient transported to MRI.

## 2022-11-22 NOTE — ED Provider Notes (Signed)
[place] EMERGENCY DEPARTMENT AT [facility] Provider Note   CSN: [id number] Arrival date & time: 11/22/22  1223     History    Donna Becker is a 77 y.o. 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  She states that this morning she felt lightheaded and then presented to her doctor's appointment across the street.  When she was leaving this appointment, she began to feel lightheaded again and then collapsed to the ground hitting her head.  She complains of pain to the back of her scalp and neck.  She does not take anticoagulants.  At baseline, she uses a walker to ambulate.  She states that before the fall she began to feel weak in her left leg.  She states that though she chronically has weakness on her right side she has recently started to feel that she has had gradually increasing weakness to her left side which is new.  This has been going on for the last several days.  No associated visual changes.  No recent nausea, vomiting, diarrhea, fever, cough, congestion, or change in p.o. intake.  Also states that she has pain diffusely throughout her left leg since her fall today.  She denies losing consciousness.  She denies a history of heart problems or recurrent syncope.  She denies associated chest pain, dyspnea, or severe headache preceding collapse.  Handoff received from EMS.  Patient primarily complaining of neck pain.  Vital signs before arrival stable.  Patient placed in c-collar before arrival.       Home Medications Prior to Admission medications   Medication Sig Start Date End Date Taking? Authorizing Provider  allopurinol (ZYLOPRIM) 100 MG tablet Take 100 mg by mouth daily.    [provider]  allopurinol (ZYLOPRIM) 100 MG tablet Take 1 tablet (100 mg total) by mouth in the morning for gout. 11/20/22     B Complex-C  (B-COMPLEX WITH VITAMIN C) tablet Take 1 tablet by mouth daily.    [provider]  bimatoprost (LUMIGAN) 0.01 % SOLN Place 1 drop into both eyes at bedtime.    [provider]  carbamazepine (TEGRETOL) 200 MG tablet Take 1 tablet (200 mg total) by mouth 3 (three) times daily. 11/08/22   Van Clines, MD  carbamazepine (TEGRETOL) 200 MG tablet Take 1 tablet (200 mg total) by mouth 3 (three) times daily for seizure. 11/20/22     carvedilol (COREG) 25 MG tablet TAKE ONE TABLET BY MOUTH TWICE DAILY Needs appointment for further refills 10/15/21   O'Neal, Ronnald Ramp, MD  carvedilol (COREG) 25 MG tablet Take 1 tablet (25 mg total) by mouth 2 (two) times daily with food. 11/20/22     colchicine 0.6 MG tablet Take 0.6 mg by mouth daily.    [provider]  furosemide (LASIX) 20 MG tablet Take 20 mg by mouth every morning.    [provider]  furosemide (LASIX) 20 MG tablet Take 1 tablet (20 mg total) by mouth daily. 11/20/22     gabapentin (NEURONTIN) 300 MG capsule Take 1 capsule at night 11/08/22   Van Clines, MD  glipiZIDE (GLUCOTROL XL) 10 MG 24 hr tablet Take 10 mg by mouth daily.    [provider]  hydrALAZINE (APRESOLINE) 100 MG tablet Take 100 mg by mouth 2 (two) times daily. 09/08/19   [provider]  hydrALAZINE (APRESOLINE) 100 MG tablet Take 1 tablet (100 mg total) by mouth 2 (two) times daily, once in the morning and once at noon. 11/20/22     losartan (COZAAR) 100 MG tablet Take 100 mg by mouth every evening.    [provider]  metFORMIN (GLUCOPHAGE) 500 MG tablet Take 500 mg by mouth 2 (two) times daily with a meal.    [provider]  Multiple Vitamin (MULTIVITAMIN WITH MINERALS) TABS tablet Take 1 tablet by mouth daily.    [provider]  NIFEdipine (ADALAT CC) 90 MG 24 hr tablet Take 1 tablet (90 mg total) by mouth daily. 01/28/22   O'NealRonnald Ramp, MD  NIFEdipine (ADALAT CC) 90 MG 24 hr tablet  Take 1 tablet (90 mg total) by mouth daily on an empty stomach. 11/20/22     pravastatin (PRAVACHOL) 20 MG tablet Take 1 tablet (20 mg total) by mouth daily. 11/20/22     pravastatin (PRAVACHOL) 40 MG tablet Take 40 mg by mouth every evening.    [provider]  Probiotic Product (PROBIOTIC DAILY) CAPS Take 1 capsule by mouth daily.    [provider]      Allergies    Codeine    Review of Systems   Review of Systems  All other systems reviewed and are negative.   Physical Exam Updated Vital Signs BP (!) 146/59   Pulse 64   Temp (!) 97.4 F (36.3 C) (Oral)   Resp 15   SpO2 97%  Physical Exam Vitals and nursing note reviewed.  Constitutional:      General: She is not in acute distress.    Appearance: Normal appearance. She is not toxic-appearing or diaphoretic.  HENT:     Head: Normocephalic.     Comments: Left parietal scalp hematoma, no open wounds or active bleeding identified    Right Ear: External ear normal.     Left Ear: External ear normal.     Mouth/Throat:     Mouth: Mucous membranes are moist.  Eyes:     Extraocular Movements: Extraocular movements intact.     Conjunctiva/sclera: Conjunctivae normal.     Pupils: Pupils are equal, round, and reactive to light.  Neck:     Comments: C-collar in place, diffuse midline C spine tenderness, no palpable deformities or stepoffs Cardiovascular:     Rate and Rhythm: Normal rate and regular rhythm.     Heart sounds: No murmur heard. Pulmonary:     Effort: Pulmonary effort is normal. No respiratory distress.     Breath sounds: Normal breath sounds. No stridor. No wheezing or rales.  Chest:     Chest wall: No tenderness.  Abdominal:     General: Abdomen is flat. There is no distension.     Palpations: Abdomen is soft.     Tenderness: There is no abdominal tenderness. There is no guarding or rebound.  Musculoskeletal:     Comments: Pelvis stable to palpation with no tenderness, baseline right upper  extremity contracture, tenderness diffusely to left lower extremity greatest over tib-fib and without obvious deformity, remainder of extremities palpated and without obvious deformity, no midline T/L spinal tenderness, stepoffs, or deformities  Skin:    General: Skin is warm and dry.     Capillary Refill: Capillary refill takes less than 2 seconds.  Neurological:     Mental Status: She is alert and oriented to person, place, and time.     Comments: Baseline contracture to right upper extremity,  slight weakness to the right lower extremity patient states is at baseline, 5/5 strength to the left upper and left lower extremity  Psychiatric:        Mood and Affect: Mood normal.        Behavior: Behavior normal.     ED Results / Procedures / Treatments   Labs (all labs ordered are listed, but only abnormal results are displayed) Labs Reviewed  CBC - Abnormal; Notable for the following components:      Result Value   WBC 3.6 (*)    RBC 3.68 (*)    Hemoglobin 11.6 (*)    HCT 35.9 (*)    All other components within normal limits  COMPREHENSIVE METABOLIC PANEL - Abnormal; Notable for the following components:   Glucose, Bld 102 (*)    BUN 29 (*)    Albumin 3.3 (*)    All other components within normal limits  I-STAT CHEM 8, ED - Abnormal; Notable for the following components:   BUN 29 (*)    Glucose, Bld 106 (*)    All other components within normal limits  SARS CORONAVIRUS 2 BY RT PCR  ETHANOL  PROTIME-INR  APTT  DIFFERENTIAL  RAPID URINE DRUG SCREEN, HOSP PERFORMED  URINALYSIS, ROUTINE W REFLEX MICROSCOPIC  I-STAT CG4 LACTIC ACID, ED  TROPONIN I (HIGH SENSITIVITY)    EKG None  Radiology DG Knee Left Port  Result Date: 11/22/2022 CLINICAL DATA:  Trauma.  Knee pain. EXAM: PORTABLE LEFT KNEE - 1-2 VIEW COMPARISON:  None Available. FINDINGS: No acute fracture or dislocation. No aggressive osseous lesion. There are marked degenerative changes of the knee joint in the form of  markedly reduced tibiofemoral compartment joint space, asymmetrically involving lateral compartment along with subchondral cystic changes / sclerosis, tibial spiking and tricompartmental osteophytosis. No knee effusion or focal soft tissue swelling. No radiopaque foreign bodies. IMPRESSION: 1. Marked tricompartmental osteoarthritis. Electronically Signed   By: Jules Schick M.D.   On: 11/22/2022 13:19   DG Tibia/Fibula Left  Result Date: 11/22/2022 CLINICAL DATA:  Trauma.  Pain. EXAM: LEFT TIBIA AND FIBULA - 2 VIEW COMPARISON:  None Available. FINDINGS: There is diffuse osteopenia of the visualized osseous structures. No acute fracture or dislocation. No aggressive osseous lesion. There are marked degenerative changes of the knee joint in the form of markedly reduced tibiofemoral compartment joint space, asymmetrically involving medial compartment along with subchondral cystic changes / sclerosis, tibial spiking and tricompartmental osteophytosis. Ankle mortise appears intact. No focal soft tissue swelling. No radiopaque foreign bodies. IMPRESSION: 1. No acute fracture or dislocation. 2. Marked degenerative changes of the knee joint. Electronically Signed   By: Jules Schick M.D.   On: 11/22/2022 13:15   DG Chest Portable 1 View  Result Date: 11/22/2022 CLINICAL DATA:  Trauma.  Chest pain. EXAM: PORTABLE CHEST 1 VIEW COMPARISON:  12/27/2010. FINDINGS: Low lung volume. Mild central and diffuse pulmonary vascular congestion, likely accentuated by low lung volume. No overt pulmonary edema. Bilateral lung fields are clear. Bilateral costophrenic angles are clear. Stable cardio-mediastinal silhouette. Aortic arch calcifications noted. No acute osseous abnormalities. The soft tissues are within normal limits. Metallic staples noted in the left axillary region. Multiple monitoring electrodes overlying the right chest. IMPRESSION: 1. Low lung volume. Mild pulmonary vascular congestion. Aortic Atherosclerosis  (ICD10-I70.0). Electronically Signed   By: Jules Schick M.D.   On: 11/22/2022 13:13    Procedures Procedures    Medications Ordered in ED Medications  fentaNYL (SUBLIMAZE) injection 25 mcg (has  no administration in time range)  ondansetron (ZOFRAN) injection 4 mg (has no administration in time range)    ED Course/ Medical Decision Making/ A&P Clinical Course as of 11/22/22 1553  Fri Nov 22, 2022  1545 Hx CP. Lightheadedness. Syncope. Head hematoma. Cervical tenderness. Usually walks with a walker. New left sided weakness over last several days. - F/u CT head and C spine - Troponin [KH]    Clinical Course User Index [KH] Claretha Cooper, DO                                 Medical Decision Making Amount and/or Complexity of Data Reviewed Labs: ordered. Decision-making details documented in ED Course. Radiology: ordered. Decision-making details documented in ED Course. ECG/medicine tests: ordered. Decision-making details documented in ED Course.  Risk Prescription drug management.   Medical Decision Making:   BETHA WIGREN is a 77 y.o. female who presented to the ED today with fall detailed above.    Handoff received from EMS.  Patient's presentation is complicated by their history of advanced age, trauma, CP, DM, HTN .  Complete initial physical exam performed, notably the patient was alert and oriented.  She had a left parietal scalp hematoma.  Diffuse midline cervical spine tenderness without deformities.  Pelvis stable.  Diffuse tenderness to the left lower extremity worse to the tib-fib, no obvious deformity.    Reviewed and confirmed nursing documentation for past medical history, family history, social history.    Initial Assessment:   With the patient's presentation, differential diagnosis includes but is not limited to CVA/TIA, ICH, fracture, dislocation, disc herniation, closed head injury, scalp hematoma/closed head injury, ACS, dehydration, orthostatic  hypotension, metabolic/infectious process.  This is most consistent with an acute complicated illness  Initial Plan:  Screening labs including CBC and Metabolic panel to evaluate for infectious or metabolic etiology of disease.  Urinalysis with reflex culture ordered to evaluate for UTI or relevant urologic/nephrologic pathology.  CXR to evaluate for structural/infectious intrathoracic pathology.  EKG and troponin to evaluate for cardiac pathology CT head, C spine to assess for traumatic injuries CXR, pelvic, LLE XR to assess for traumatic injuries COVID swab, coags to assess for etiology of weakness/stroke workup Objective evaluation as below reviewed   Initial Study Results:   Laboratory  All laboratory results reviewed without evidence of clinically relevant pathology.   Exceptions include: Hgb 11.6   Radiology:  All images reviewed independently. Agree with radiology report at this time.   DG Knee Left Port  Result Date: 11/22/2022 CLINICAL DATA:  Trauma.  Knee pain. EXAM: PORTABLE LEFT KNEE - 1-2 VIEW COMPARISON:  None Available. FINDINGS: No acute fracture or dislocation. No aggressive osseous lesion. There are marked degenerative changes of the knee joint in the form of markedly reduced tibiofemoral compartment joint space, asymmetrically involving lateral compartment along with subchondral cystic changes / sclerosis, tibial spiking and tricompartmental osteophytosis. No knee effusion or focal soft tissue swelling. No radiopaque foreign bodies. IMPRESSION: 1. Marked tricompartmental osteoarthritis. Electronically Signed   By: Jules Schick M.D.   On: 11/22/2022 13:19   DG Tibia/Fibula Left  Result Date: 11/22/2022 CLINICAL DATA:  Trauma.  Pain. EXAM: LEFT TIBIA AND FIBULA - 2 VIEW COMPARISON:  None Available. FINDINGS: There is diffuse osteopenia of the visualized osseous structures. No acute fracture or dislocation. No aggressive osseous lesion. There are marked degenerative  changes of the knee joint in the form of  markedly reduced tibiofemoral compartment joint space, asymmetrically involving medial compartment along with subchondral cystic changes / sclerosis, tibial spiking and tricompartmental osteophytosis. Ankle mortise appears intact. No focal soft tissue swelling. No radiopaque foreign bodies. IMPRESSION: 1. No acute fracture or dislocation. 2. Marked degenerative changes of the knee joint. Electronically Signed   By: Jules Schick M.D.   On: 11/22/2022 13:15   DG Chest Portable 1 View  Result Date: 11/22/2022 CLINICAL DATA:  Trauma.  Chest pain. EXAM: PORTABLE CHEST 1 VIEW COMPARISON:  12/27/2010. FINDINGS: Low lung volume. Mild central and diffuse pulmonary vascular congestion, likely accentuated by low lung volume. No overt pulmonary edema. Bilateral lung fields are clear. Bilateral costophrenic angles are clear. Stable cardio-mediastinal silhouette. Aortic arch calcifications noted. No acute osseous abnormalities. The soft tissues are within normal limits. Metallic staples noted in the left axillary region. Multiple monitoring electrodes overlying the right chest. IMPRESSION: 1. Low lung volume. Mild pulmonary vascular congestion. Aortic Atherosclerosis (ICD10-I70.0). Electronically Signed   By: Jules Schick M.D.   On: 11/22/2022 13:13      Final Assessment and Plan:   77 year old female presents to ED post fall. Described preceding symptoms of lightheadedness. No chest pain, no dyspnea. No recent volume losses. At baseline ambulates with walker. Has baseline right sided weakness and contractures. Notes newly developed left sided weakness. 5/5 left sided strength on exam. Hit head during collapse, has scalp hematoma, no open wounds, PERRL, EOMI. Mostly c/o midline C spine tenderness. No deformities. Also c/o diffuse L leg pain, negative x-rays. No full LOC.  Low suspicion for ACS. Pelvis stable, negative x-ray. CXR normal. No witnessed seizure like activity, AMS,  normal lactic, low suspicion for seizure. Pending CT head and c-spine, care signed out to Dr. Valora Corporal, pending remainder of workup and disposition.    Clinical Impression:  1. Fall, initial encounter   2. Injury of head, initial encounter      Data Unavailable           Final Clinical Impression(s) / ED Diagnoses Final diagnoses:  None    Rx / DC Orders ED Discharge Orders     None         Tonette Lederer, PA-C 11/22/22 1558    Wynetta Fines, MD 11/22/22 [phone]

## 2022-11-22 NOTE — ED Provider Notes (Signed)
  Physical Exam  BP (!) 170/71   Pulse 70   Temp (!) 97.4 F (36.3 C) (Oral)   Resp 15   SpO2 97%   Physical Exam Vitals and nursing note reviewed.  Constitutional:      General: She is not in acute distress.    Appearance: She is well-developed. She is ill-appearing (Chronically).  HENT:     Head: Normocephalic.  Cardiovascular:     Rate and Rhythm: Normal rate and regular rhythm.  Pulmonary:     Effort: Pulmonary effort is normal. No respiratory distress.  Musculoskeletal:     Cervical back: Neck supple.  Skin:    General: Skin is warm and dry.  Neurological:     Mental Status: She is alert.     Comments: Chronic contracture of the right upper extremity, patient able to lift this extremity and reports that strength is at baseline.  She is able to lift left arm from the bed but is unable to resist force.  She is able to hold right lower extremity against resistance but has more difficulty with the left lower extremity.  Sensation is intact.     Procedures  Procedures  ED Course / MDM   Clinical Course as of 11/22/22 1655  Fri Nov 22, 2022  1545 Hx CP. Lightheadedness. Syncope. Head hematoma. Cervical tenderness. Usually walks with a walker. New left sided weakness over last several days. - F/u CT head and C spine    Clinical Course User Index [KH] Claretha Cooper, DO   Medical Decision Making Amount and/or Complexity of Data Reviewed Labs: ordered. Radiology: ordered.  Risk Prescription drug management.   At the time of my assumption of care, patient is afebrile, hemodynamically stable, no acute distress.  She does report persistent left upper and lower extremity weakness which is different from her baseline has been present for the past few days.  Denies any facial droop or dysarthria.    Results reviewed.  CBC without leukocytosis, mild anemia present with hemoglobin 11.6.  CMP with overall normal electrolytes, BUN mildly elevated but similar when compared to  prior.  UA with rare bacteria, overall noninfectious.  CT head and C-spine are without evidence of acute injury.  Given patient's new weakness, do feel that she would benefit from MRI to further evaluate.  MRI brain and C-spine are without acute injury.  Do demonstrate microhemorrhages.  Discussed this finding with patient.  On my reevaluation, she reports that she is feeling well however is hypertensive.  She does usually take her home antihypertensives around this time.  Ordered her home hydralazine.  Given stability of symptoms, feel that she is appropriate for discharge with outpatient follow-up.  She does report that she can follow-up with her primary care doctor this week and has an appointment with her physical therapist next week as well.  Patient discharged without further acute eval under my care in the emergency department.    Claretha Cooper, DO 11/22/22 2254    Charlynne Pander, MD 11/22/22 [phone]

## 2022-11-27 DIAGNOSIS — I350 Nonrheumatic aortic (valve) stenosis: Secondary | ICD-10-CM | POA: Diagnosis not present

## 2022-11-27 DIAGNOSIS — M75100 Unspecified rotator cuff tear or rupture of unspecified shoulder, not specified as traumatic: Secondary | ICD-10-CM | POA: Diagnosis not present

## 2022-11-27 DIAGNOSIS — I890 Lymphedema, not elsewhere classified: Secondary | ICD-10-CM | POA: Diagnosis not present

## 2022-11-27 DIAGNOSIS — G808 Other cerebral palsy: Secondary | ICD-10-CM | POA: Diagnosis not present

## 2022-11-27 DIAGNOSIS — R296 Repeated falls: Secondary | ICD-10-CM | POA: Diagnosis not present

## 2022-11-27 DIAGNOSIS — R269 Unspecified abnormalities of gait and mobility: Secondary | ICD-10-CM | POA: Diagnosis not present

## 2022-11-27 DIAGNOSIS — E119 Type 2 diabetes mellitus without complications: Secondary | ICD-10-CM | POA: Diagnosis not present

## 2022-11-27 DIAGNOSIS — E7800 Pure hypercholesterolemia, unspecified: Secondary | ICD-10-CM | POA: Diagnosis not present

## 2022-11-27 DIAGNOSIS — I10 Essential (primary) hypertension: Secondary | ICD-10-CM | POA: Diagnosis not present

## 2022-11-28 ENCOUNTER — Other Ambulatory Visit: Payer: Self-pay

## 2022-11-28 ENCOUNTER — Encounter (HOSPITAL_COMMUNITY): Payer: Self-pay | Admitting: Emergency Medicine

## 2022-11-28 ENCOUNTER — Telehealth: Payer: Self-pay | Admitting: Neurology

## 2022-11-28 ENCOUNTER — Emergency Department (HOSPITAL_COMMUNITY): Payer: Medicare PPO

## 2022-11-28 ENCOUNTER — Emergency Department (HOSPITAL_COMMUNITY)
Admission: EM | Admit: 2022-11-28 | Discharge: 2022-11-29 | Disposition: A | Discharge status: Home / Self Care | Payer: Medicare PPO | Attending: Emergency Medicine | Admitting: Emergency Medicine

## 2022-11-28 DIAGNOSIS — M48061 Spinal stenosis, lumbar region without neurogenic claudication: Secondary | ICD-10-CM | POA: Diagnosis not present

## 2022-11-28 DIAGNOSIS — Z7984 Long term (current) use of oral hypoglycemic drugs: Secondary | ICD-10-CM | POA: Diagnosis not present

## 2022-11-28 DIAGNOSIS — R404 Transient alteration of awareness: Secondary | ICD-10-CM | POA: Insufficient documentation

## 2022-11-28 DIAGNOSIS — I10 Essential (primary) hypertension: Secondary | ICD-10-CM | POA: Diagnosis not present

## 2022-11-28 DIAGNOSIS — G40009 Localization-related (focal) (partial) idiopathic epilepsy and epileptic syndromes with seizures of localized onset, not intractable, without status epilepticus: Secondary | ICD-10-CM | POA: Diagnosis not present

## 2022-11-28 DIAGNOSIS — W1830XA Fall on same level, unspecified, initial encounter: Secondary | ICD-10-CM | POA: Insufficient documentation

## 2022-11-28 DIAGNOSIS — M109 Gout, unspecified: Secondary | ICD-10-CM | POA: Diagnosis not present

## 2022-11-28 DIAGNOSIS — Z79899 Other long term (current) drug therapy: Secondary | ICD-10-CM | POA: Diagnosis not present

## 2022-11-28 DIAGNOSIS — S0993XA Unspecified injury of face, initial encounter: Secondary | ICD-10-CM | POA: Diagnosis not present

## 2022-11-28 DIAGNOSIS — I890 Lymphedema, not elsewhere classified: Secondary | ICD-10-CM | POA: Diagnosis not present

## 2022-11-28 DIAGNOSIS — R718 Other abnormality of red blood cells: Secondary | ICD-10-CM | POA: Diagnosis not present

## 2022-11-28 DIAGNOSIS — E119 Type 2 diabetes mellitus without complications: Secondary | ICD-10-CM | POA: Diagnosis not present

## 2022-11-28 DIAGNOSIS — Z853 Personal history of malignant neoplasm of breast: Secondary | ICD-10-CM | POA: Diagnosis not present

## 2022-11-28 DIAGNOSIS — W19XXXA Unspecified fall, initial encounter: Secondary | ICD-10-CM

## 2022-11-28 DIAGNOSIS — G802 Spastic hemiplegic cerebral palsy: Secondary | ICD-10-CM | POA: Diagnosis not present

## 2022-11-28 DIAGNOSIS — M1990 Unspecified osteoarthritis, unspecified site: Secondary | ICD-10-CM | POA: Diagnosis not present

## 2022-11-28 DIAGNOSIS — S199XXA Unspecified injury of neck, initial encounter: Secondary | ICD-10-CM | POA: Diagnosis not present

## 2022-11-28 DIAGNOSIS — R9431 Abnormal electrocardiogram [ECG] [EKG]: Secondary | ICD-10-CM | POA: Diagnosis not present

## 2022-11-28 DIAGNOSIS — J439 Emphysema, unspecified: Secondary | ICD-10-CM | POA: Diagnosis not present

## 2022-11-28 DIAGNOSIS — S0990XA Unspecified injury of head, initial encounter: Secondary | ICD-10-CM | POA: Diagnosis not present

## 2022-11-28 DIAGNOSIS — H409 Unspecified glaucoma: Secondary | ICD-10-CM | POA: Diagnosis not present

## 2022-11-28 DIAGNOSIS — R4189 Other symptoms and signs involving cognitive functions and awareness: Secondary | ICD-10-CM

## 2022-11-28 LAB — CBC
HCT: 35.8 % — ABNORMAL LOW (ref 36.0–46.0)
Hemoglobin: 11.6 g/dL — ABNORMAL LOW (ref 12.0–15.0)
MCH: 30.1 pg (ref 26.0–34.0)
MCHC: 32.4 g/dL (ref 30.0–36.0)
MCV: 93 fL (ref 80.0–100.0)
Platelets: 217 10*3/uL (ref 150–400)
RBC: 3.85 MIL/uL — ABNORMAL LOW (ref 3.87–5.11)
RDW: 13.8 % (ref 11.5–15.5)
WBC: 4.4 10*3/uL (ref 4.0–10.5)
nRBC: 0 % (ref 0.0–0.2)

## 2022-11-28 LAB — BASIC METABOLIC PANEL
Anion gap: 17 — ABNORMAL HIGH (ref 5–15)
BUN: 44 mg/dL — ABNORMAL HIGH (ref 8–23)
CO2: 23 mmol/L (ref 22–32)
Calcium: 9.4 mg/dL (ref 8.9–10.3)
Chloride: 99 mmol/L (ref 98–111)
Creatinine, Ser: 1 mg/dL (ref 0.44–1.00)
GFR, Estimated: 58 mL/min — ABNORMAL LOW (ref >60)
Glucose, Bld: 120 mg/dL — ABNORMAL HIGH (ref 70–99)
Potassium: 3.9 mmol/L (ref 3.5–5.1)
Sodium: 139 mmol/L (ref 135–145)

## 2022-11-28 MED ORDER — SODIUM CHLORIDE 0.9 % IV BOLUS
1000.0000 mL | Freq: Once | INTRAVENOUS | Status: AC
  Administered 2022-11-29: 1000 mL via INTRAVENOUS

## 2022-11-28 NOTE — ED Provider Notes (Signed)
77 yo female here for fall, hit head on ground last night while being transferred by family. Patient is more slow to respond her sister. Pending head CT and labs for metabolic causes.  Answers questions but seems sleepy/dazed. Hx CVA with right side deficits, left arm lymphedema post CA Add on tegretol level for hx seizures Family prefers patient at home, has support.  Provided with IV fluids.  Physical Exam  BP (!) 194/79 (BP Location: Right Arm)   Pulse 60   Temp 98.6 F (37 C) (Oral)   Resp 18   Ht 5' 5.5" (1.664 m)   Wt 60.4 kg   SpO2 93%   BMI 21.82 kg/m   Physical Exam  Procedures  Procedures  ED Course / MDM    Medical Decision Making Amount and/or Complexity of Data Reviewed Labs: ordered. Radiology: ordered.   Labs without significant findings, imaging unremarkable. Recommend return to ER for any concerning symptoms, recheck with PCP.        Jeannie Fend, PA-C 11/29/22 1610    Gilda Crease, MD 11/29/22 [phone]

## 2022-11-28 NOTE — ED Provider Notes (Signed)
[place] [facility]ERGENCY DEPARTMENT AT [facility] Provider Note   CSN: [id number] Arrival date & time: 11/28/22  1853     History  Chief Complaint  Patient presents with   Marletta Lor    Donna Becker is a 77 y.o. female.  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  Patient ambulates with a walker.  She had a fall about a week ago and was seen in the emergency department and had CTs and MRIs of the brain and cervical spine without acute changes.  She states that while helping her transfer with a belt last evening, patient fell and hit her head on the bed rail.  At that time it was not felt that she needed to come to the hospital for this.  Patient's sister at bedside reports that the patient was slower to respond today, had to think longer about answering questions.  She was able to get up and dress independently.  Physical therapy came by today and noted the change as well.  This led to them bring her to the emergency department for evaluation.  Patient did have some swelling around her right orbit and complaints of a generalized headache.  No nausea or vomiting.       Home Medications Prior to Admission medications   Medication Sig Start Date End Date Taking? Authorizing Provider  allopurinol (ZYLOPRIM) 100 MG tablet Take 100 mg by mouth daily.    [provider]  allopurinol (ZYLOPRIM) 100 MG tablet Take 1 tablet (100 mg total) by mouth in the morning for gout. 11/20/22     B Complex-C (B-COMPLEX WITH VITAMIN C) tablet Take 1 tablet by mouth daily.    [provider]  bimatoprost (LUMIGAN) 0.01 % SOLN Place 1 drop into both eyes at bedtime.    [provider]  carbamazepine (TEGRETOL) 200 MG tablet Take 1 tablet (200 mg total) by mouth 3 (three) times daily. 11/08/22   Van Clines, MD  carbamazepine (TEGRETOL) 200 MG tablet Take 1 tablet (200 mg total) by mouth 3 (three) times daily for seizure. 11/20/22     carvedilol (COREG) 25 MG tablet TAKE ONE TABLET BY MOUTH TWICE DAILY Needs appointment for further refills 10/15/21   O'Neal, Ronnald Ramp, MD  carvedilol (COREG) 25 MG tablet Take 1 tablet (25 mg total) by mouth 2 (two) times daily with food. 11/20/22     colchicine 0.6 MG tablet Take 0.6 mg by mouth daily.    [provider]  furosemide (LASIX) 20 MG tablet Take 20 mg by mouth every morning.    [provider]  furosemide (LASIX) 20 MG tablet Take 1 tablet (20 mg total) by mouth daily. 11/20/22     gabapentin (NEURONTIN) 300 MG capsule Take 1 capsule at night 11/08/22   Van Clines, MD  glipiZIDE (GLUCOTROL XL) 10 MG 24 hr tablet Take 10 mg by mouth daily.    [provider]  hydrALAZINE (APRESOLINE) 100 MG tablet Take 100 mg by mouth 2 (two) times daily. 09/08/19   [provider]  hydrALAZINE (APRESOLINE) 100 MG tablet Take 1 tablet (100 mg total) by mouth 2 (two) times daily, once  in the morning and once at noon. 11/20/22     losartan (COZAAR) 100 MG tablet Take 100 mg by mouth every evening.    [provider]  metFORMIN (GLUCOPHAGE) 500 MG tablet Take 500 mg by mouth 2 (two) times daily with a meal.    [provider]  Multiple Vitamin (MULTIVITAMIN WITH MINERALS) TABS tablet Take 1 tablet by mouth daily.    [provider]  NIFEdipine (ADALAT CC) 90 MG 24 hr tablet Take 1 tablet (90 mg total) by mouth daily. 01/28/22   O'NealRonnald Ramp, MD  NIFEdipine (ADALAT CC) 90 MG 24 hr tablet Take 1 tablet (90 mg total) by mouth daily on an empty stomach. 11/20/22      pravastatin (PRAVACHOL) 20 MG tablet Take 1 tablet (20 mg total) by mouth daily. 11/20/22     pravastatin (PRAVACHOL) 40 MG tablet Take 40 mg by mouth every evening.    [provider]  Probiotic Product (PROBIOTIC DAILY) CAPS Take 1 capsule by mouth daily.    [provider]      Allergies    Codeine    Review of Systems   Review of Systems  Physical Exam Updated Vital Signs BP (!) 143/67 (BP Location: Right Arm)   Pulse 65   Temp 98.4 F (36.9 C)   Resp 16   Ht 5' 5.5" (1.664 m)   Wt 60.4 kg   SpO2 93%   BMI 21.82 kg/m   Physical Exam Vitals and nursing note reviewed.  Constitutional:      Appearance: She is well-developed. She is not diaphoretic.  HENT:     Head: Normocephalic.     Comments: Question mild ecchymosis R orbit    Right Ear: External ear normal.     Left Ear: External ear normal.     Mouth/Throat:     Mouth: Mucous membranes are not dry.  Eyes:     Conjunctiva/sclera: Conjunctivae normal.  Neck:     Vascular: Normal carotid pulses. No JVD.     Trachea: Trachea normal. No tracheal deviation.  Cardiovascular:     Rate and Rhythm: Normal rate and regular rhythm.     Pulses: No decreased pulses.          Radial pulses are 2+ on the right side and 2+ on the left side.     Heart sounds: Normal heart sounds, S1 normal and S2 normal. No murmur heard. Pulmonary:     Effort: Pulmonary effort is normal. No respiratory distress.     Breath sounds: No wheezing.  Chest:     Chest wall: No tenderness.  Abdominal:     General: Bowel sounds are normal.     Palpations: Abdomen is soft.     Tenderness: There is no abdominal tenderness. There is no guarding or rebound.  Musculoskeletal:        General: Normal range of motion.     Cervical back: Normal range of motion and neck supple. No muscular tenderness.  Skin:    General: Skin is warm and dry.     Coloration: Skin is not pale.  Neurological:     Mental Status: She is lethargic.      Cranial Nerves: No dysarthria.     Comments: Spasticity noted right upper extremity.  Lymphedema with arm wrapped in left upper extremity.  Patient answers questions but is very sleepy and somewhat slow to respond at times.     ED Results / Procedures / Treatments  Labs (all labs ordered are listed, but only abnormal results are displayed) Labs Reviewed  BASIC METABOLIC PANEL - Abnormal; Notable for the following components:      Result Value   Glucose, Bld 120 (*)    BUN 44 (*)    GFR, Estimated 58 (*)    Anion gap 17 (*)    All other components within normal limits  CBC - Abnormal; Notable for the following components:   RBC 3.85 (*)    Hemoglobin 11.6 (*)    HCT 35.8 (*)    All other components within normal limits  URINALYSIS, ROUTINE W REFLEX MICROSCOPIC  HEPATIC FUNCTION PANEL  AMMONIA  CARBAMAZEPINE LEVEL, TOTAL    EKG None  Radiology No results found.  Procedures Procedures    Medications Ordered in ED Medications  sodium chloride 0.9 % bolus 1,000 mL (has no administration in time range)    ED Course/ Medical Decision Making/ A&P    Patient seen and examined. History obtained directly from patient. Work-up including labs, imaging, EKG ordered in triage, if performed, were reviewed.  I reviewed patient's outpatient notes including neurology notes.  Additional history obtained from family member at bedside.  Labs/EKG: Independently reviewed and interpreted.  This included: CBC with normal white blood cell count, hemoglobin slightly low at 11.6 otherwise unremarkable; BMP with glucose 120, BUN minimally elevated at 44 with a creatinine 1.00, BUN/Crt = 44.  UA pending.  I have added hepatic function panel, ammonia, Tegretol level.  Imaging: CT head, CT maxillofacial, CT cervical spine-ordered, have been performed and awaiting results.  Medications/Fluids: Ordered: IV fluid bolus  Most recent vital signs reviewed and are as follows: BP (!) 143/67 (BP  Location: Right Arm)   Pulse 65   Temp 98.4 F (36.9 C)   Resp 16   Ht 5' 5.5" (1.664 m)   Wt 60.4 kg   SpO2 93%   BMI 21.82 kg/m   Initial impression: Decreased level of consciousness and patient with minor head injury yesterday.  Unclear etiology.  11:25 PM Patient discussed with and seen by Dr. Eloise Harman.   Eulah Pont PA-C aware of patient at shift change.                                 Medical Decision Making Amount and/or Complexity of Data Reviewed Labs: ordered. Radiology: ordered.           Final Clinical Impression(s) / ED Diagnoses Final diagnoses:  Fall, initial encounter  Decreased level of consciousness    Rx / DC Orders ED Discharge Orders     None         Renne Crigler, Cordelia Poche 11/28/22 2327    Rondel Baton, MD 11/30/22 1053

## 2022-11-28 NOTE — ED Triage Notes (Signed)
Pt presents reporting fall last night striking the left side of her face and head on metal bed frame.  Unsure of how she fell or if she passed out and then fell.  Pt is alert and oriented .Not on blood thinners.  Left arm is in compression sleeve d/t lymphedema.

## 2022-11-28 NOTE — Telephone Encounter (Signed)
Vivia Budge w/[facility] is calling to report when he went out to do PT with the pt she mentioned that she had a fall on 11/27/2022 at night and bumped her head on the bed rail and her brother helped her up.  Pt did not show and evidence that she had any bruises or abrasions.  Pt did not feel that she needed to go to the doctor.  Elijah Birk did noticed that the pt was walking a little slower and he told her that they were going to take it easy and she was to rest.    Elijah Birk was concerned wanted to see if someone from the office could give her a call to check on her.

## 2022-11-29 ENCOUNTER — Telehealth: Payer: Self-pay | Admitting: Neurology

## 2022-11-29 LAB — HEPATIC FUNCTION PANEL
ALT: 24 U/L (ref 0–44)
AST: 53 U/L — ABNORMAL HIGH (ref 15–41)
Albumin: 3.1 g/dL — ABNORMAL LOW (ref 3.5–5.0)
Alkaline Phosphatase: 98 U/L (ref 38–126)
Bilirubin, Direct: 0.5 mg/dL — ABNORMAL HIGH (ref 0.0–0.2)
Indirect Bilirubin: 0.6 mg/dL (ref 0.3–0.9)
Total Bilirubin: 1.1 mg/dL (ref 0.3–1.2)
Total Protein: 7 g/dL (ref 6.5–8.1)

## 2022-11-29 LAB — URINALYSIS, ROUTINE W REFLEX MICROSCOPIC
Bilirubin Urine: NEGATIVE
Glucose, UA: NEGATIVE mg/dL
Hgb urine dipstick: NEGATIVE
Ketones, ur: NEGATIVE mg/dL
Leukocytes,Ua: NEGATIVE
Nitrite: NEGATIVE
Protein, ur: 100 mg/dL — AB
RBC / HPF: >50 RBC/hpf (ref 0–5)
Specific Gravity, Urine: 1.017 (ref 1.005–1.030)
pH: 5 (ref 5.0–8.0)

## 2022-11-29 LAB — AMMONIA: Ammonia: 10 umol/L (ref 9–35)

## 2022-11-29 LAB — CARBAMAZEPINE LEVEL, TOTAL: Carbamazepine Lvl: 8.8 ug/mL (ref 4.0–12.0)

## 2022-11-29 NOTE — Telephone Encounter (Signed)
Pls see how she is doing. Let them know the brain and cervical spine scans did not show any bleed, stroke, or fractures. It showed the old changes seen on prior scans. thanks

## 2022-11-29 NOTE — Telephone Encounter (Signed)
Spoke with pt sister informed her that the brain and cervical spine scans did not show any bleed, stroke, or fractures. It showed the old changes seen on prior scans. She stated that Donna Becker is doing better just sleeping because they gave her medication for a headache,

## 2022-11-29 NOTE — ED Notes (Signed)
Bladder scan 464 ml 

## 2022-11-29 NOTE — Discharge Instructions (Signed)
Your work up today does not show anything significant to explain your symptoms. Recommend recheck with your primary care provider.  Return to the emergency room at any time for any concerns.

## 2022-11-29 NOTE — Telephone Encounter (Signed)
Pt called no answer left a voice mail to call the office back  °

## 2022-11-29 NOTE — ED Notes (Signed)
Ammonia labs collected twice. Called lab the first time they said they did not receive and Alex (RN) sent a second set around 0200 am and its still not showing.

## 2022-11-29 NOTE — Telephone Encounter (Signed)
Pt's sister called in returning Heather's call

## 2022-11-29 NOTE — ED Notes (Signed)
This Nt and RN Rodney Booze attempted in and out cath x2 with no success.

## 2022-11-29 NOTE — Telephone Encounter (Signed)
Pt was seen last night in the ER and had scans done, pt sister stated that they did not get any results from the scans

## 2022-11-29 NOTE — Telephone Encounter (Signed)
See other phone note

## 2022-11-29 NOTE — ED Notes (Signed)
Called main lab to inquire about ammonia and they said they did not get the second one and when I stated " I sent another one and they stated "oh its in the tube, ill send another one"

## 2022-11-30 ENCOUNTER — Other Ambulatory Visit: Payer: Self-pay

## 2022-12-10 DIAGNOSIS — M1990 Unspecified osteoarthritis, unspecified site: Secondary | ICD-10-CM | POA: Diagnosis not present

## 2022-12-10 DIAGNOSIS — E119 Type 2 diabetes mellitus without complications: Secondary | ICD-10-CM | POA: Diagnosis not present

## 2022-12-10 DIAGNOSIS — M48061 Spinal stenosis, lumbar region without neurogenic claudication: Secondary | ICD-10-CM | POA: Diagnosis not present

## 2022-12-10 DIAGNOSIS — G40009 Localization-related (focal) (partial) idiopathic epilepsy and epileptic syndromes with seizures of localized onset, not intractable, without status epilepticus: Secondary | ICD-10-CM | POA: Diagnosis not present

## 2022-12-10 DIAGNOSIS — I10 Essential (primary) hypertension: Secondary | ICD-10-CM | POA: Diagnosis not present

## 2022-12-10 DIAGNOSIS — H409 Unspecified glaucoma: Secondary | ICD-10-CM | POA: Diagnosis not present

## 2022-12-10 DIAGNOSIS — M109 Gout, unspecified: Secondary | ICD-10-CM | POA: Diagnosis not present

## 2022-12-10 DIAGNOSIS — G802 Spastic hemiplegic cerebral palsy: Secondary | ICD-10-CM | POA: Diagnosis not present

## 2022-12-10 DIAGNOSIS — I890 Lymphedema, not elsewhere classified: Secondary | ICD-10-CM | POA: Diagnosis not present

## 2022-12-12 DIAGNOSIS — H409 Unspecified glaucoma: Secondary | ICD-10-CM | POA: Diagnosis not present

## 2022-12-12 DIAGNOSIS — I10 Essential (primary) hypertension: Secondary | ICD-10-CM | POA: Diagnosis not present

## 2022-12-12 DIAGNOSIS — M48061 Spinal stenosis, lumbar region without neurogenic claudication: Secondary | ICD-10-CM | POA: Diagnosis not present

## 2022-12-12 DIAGNOSIS — I890 Lymphedema, not elsewhere classified: Secondary | ICD-10-CM | POA: Diagnosis not present

## 2022-12-12 DIAGNOSIS — M109 Gout, unspecified: Secondary | ICD-10-CM | POA: Diagnosis not present

## 2022-12-12 DIAGNOSIS — G802 Spastic hemiplegic cerebral palsy: Secondary | ICD-10-CM | POA: Diagnosis not present

## 2022-12-12 DIAGNOSIS — M1990 Unspecified osteoarthritis, unspecified site: Secondary | ICD-10-CM | POA: Diagnosis not present

## 2022-12-12 DIAGNOSIS — E119 Type 2 diabetes mellitus without complications: Secondary | ICD-10-CM | POA: Diagnosis not present

## 2022-12-12 DIAGNOSIS — G40009 Localization-related (focal) (partial) idiopathic epilepsy and epileptic syndromes with seizures of localized onset, not intractable, without status epilepticus: Secondary | ICD-10-CM | POA: Diagnosis not present

## 2022-12-16 ENCOUNTER — Encounter: Payer: Self-pay | Admitting: Podiatry

## 2022-12-16 ENCOUNTER — Ambulatory Visit: Payer: Medicare PPO | Admitting: Podiatry

## 2022-12-16 DIAGNOSIS — M79675 Pain in left toe(s): Secondary | ICD-10-CM

## 2022-12-16 DIAGNOSIS — B351 Tinea unguium: Secondary | ICD-10-CM | POA: Diagnosis not present

## 2022-12-16 DIAGNOSIS — M79674 Pain in right toe(s): Secondary | ICD-10-CM | POA: Diagnosis not present

## 2022-12-16 NOTE — Progress Notes (Signed)
This patient presents  to my office for at risk foot care.  This patient requires this care by a professional since this patient will be at risk due to having diabetes with no complications. This patient is unable to cut nails herself since the patient cannot reach her nails.These nails are painful walking and wearing shoes.  This patient presents for at risk foot care today.  General Appearance  Alert, conversant and in no acute stress.  Vascular  Dorsalis pedis and posterior tibial  pulses are palpable  bilaterally.  Capillary return is within normal limits  bilaterally. Temperature is within normal limits  bilaterally.  Neurologic  Senn-Weinstein monofilament wire test within normal limits  bilaterally. Muscle power within normal limits bilaterally.  Nails Thick disfigured discolored nails with subungual debris  from hallux to fifth toes bilaterally. No evidence of bacterial infection or drainage bilaterally.  Orthopedic  No limitations of motion  feet .  No crepitus or effusions noted.  No bony pathology or digital deformities noted.  Skin  normotropic skin with no porokeratosis noted bilaterally.  No signs of infections or ulcers noted.     Onychomycosis  Pain in right toes  Pain in left toes    Consent was obtained for treatment procedures.   Mechanical debridement of nails 1-5  bilaterally performed with a nail nipper.  Filed with dremel without incident.    Return office visit  3  months                    Told patient to return for periodic foot care and evaluation due to potential at risk complications.   Helane Gunther DPM

## 2022-12-17 ENCOUNTER — Other Ambulatory Visit (HOSPITAL_COMMUNITY): Payer: Self-pay

## 2022-12-17 MED ORDER — XIIDRA 5 % OP SOLN
1.0000 [drp] | Freq: Two times a day (BID) | OPHTHALMIC | 6 refills | Status: AC
  Filled 2022-12-17: qty 180, 90d supply, fill #0
  Filled 2023-03-12 – 2023-03-18 (×2): qty 180, 90d supply, fill #1
  Filled 2023-06-10: qty 180, 90d supply, fill #2
  Filled 2023-09-08: qty 180, 90d supply, fill #3
  Filled 2023-12-02 – 2023-12-06 (×2): qty 180, 90d supply, fill #4

## 2022-12-18 ENCOUNTER — Other Ambulatory Visit: Payer: Self-pay

## 2022-12-18 ENCOUNTER — Other Ambulatory Visit (HOSPITAL_COMMUNITY): Payer: Self-pay

## 2022-12-19 ENCOUNTER — Other Ambulatory Visit: Payer: Self-pay

## 2022-12-20 ENCOUNTER — Other Ambulatory Visit (HOSPITAL_COMMUNITY): Payer: Self-pay

## 2022-12-20 ENCOUNTER — Other Ambulatory Visit: Payer: Self-pay

## 2022-12-20 ENCOUNTER — Other Ambulatory Visit (HOSPITAL_BASED_OUTPATIENT_CLINIC_OR_DEPARTMENT_OTHER): Payer: Self-pay

## 2022-12-23 ENCOUNTER — Other Ambulatory Visit (HOSPITAL_COMMUNITY): Payer: Self-pay

## 2022-12-23 ENCOUNTER — Other Ambulatory Visit: Payer: Self-pay

## 2022-12-25 ENCOUNTER — Telehealth: Payer: Self-pay | Admitting: Neurology

## 2022-12-25 DIAGNOSIS — M48061 Spinal stenosis, lumbar region without neurogenic claudication: Secondary | ICD-10-CM | POA: Diagnosis not present

## 2022-12-25 DIAGNOSIS — M109 Gout, unspecified: Secondary | ICD-10-CM | POA: Diagnosis not present

## 2022-12-25 DIAGNOSIS — G40009 Localization-related (focal) (partial) idiopathic epilepsy and epileptic syndromes with seizures of localized onset, not intractable, without status epilepticus: Secondary | ICD-10-CM | POA: Diagnosis not present

## 2022-12-25 DIAGNOSIS — H409 Unspecified glaucoma: Secondary | ICD-10-CM | POA: Diagnosis not present

## 2022-12-25 DIAGNOSIS — I890 Lymphedema, not elsewhere classified: Secondary | ICD-10-CM | POA: Diagnosis not present

## 2022-12-25 DIAGNOSIS — I10 Essential (primary) hypertension: Secondary | ICD-10-CM | POA: Diagnosis not present

## 2022-12-25 DIAGNOSIS — G802 Spastic hemiplegic cerebral palsy: Secondary | ICD-10-CM | POA: Diagnosis not present

## 2022-12-25 DIAGNOSIS — E119 Type 2 diabetes mellitus without complications: Secondary | ICD-10-CM | POA: Diagnosis not present

## 2022-12-25 DIAGNOSIS — M1990 Unspecified osteoarthritis, unspecified site: Secondary | ICD-10-CM | POA: Diagnosis not present

## 2022-12-25 NOTE — Telephone Encounter (Signed)
Ron Parker Called from [facility] he's is a PT. [phone]  Patient has swelling in left arm and it is swollen . Been like that for a month

## 2022-12-27 DIAGNOSIS — I890 Lymphedema, not elsewhere classified: Secondary | ICD-10-CM | POA: Diagnosis not present

## 2022-12-27 DIAGNOSIS — M1990 Unspecified osteoarthritis, unspecified site: Secondary | ICD-10-CM | POA: Diagnosis not present

## 2022-12-27 DIAGNOSIS — E119 Type 2 diabetes mellitus without complications: Secondary | ICD-10-CM | POA: Diagnosis not present

## 2022-12-27 DIAGNOSIS — H409 Unspecified glaucoma: Secondary | ICD-10-CM | POA: Diagnosis not present

## 2022-12-27 DIAGNOSIS — M48061 Spinal stenosis, lumbar region without neurogenic claudication: Secondary | ICD-10-CM | POA: Diagnosis not present

## 2022-12-27 DIAGNOSIS — M109 Gout, unspecified: Secondary | ICD-10-CM | POA: Diagnosis not present

## 2022-12-27 DIAGNOSIS — G40009 Localization-related (focal) (partial) idiopathic epilepsy and epileptic syndromes with seizures of localized onset, not intractable, without status epilepticus: Secondary | ICD-10-CM | POA: Diagnosis not present

## 2022-12-27 DIAGNOSIS — I10 Essential (primary) hypertension: Secondary | ICD-10-CM | POA: Diagnosis not present

## 2022-12-27 DIAGNOSIS — G802 Spastic hemiplegic cerebral palsy: Secondary | ICD-10-CM | POA: Diagnosis not present

## 2022-12-30 NOTE — Telephone Encounter (Signed)
Yes pls, thanks! 

## 2022-12-31 NOTE — Telephone Encounter (Signed)
Pt called no answer left a voice mail to call the office back  °

## 2023-01-01 ENCOUNTER — Telehealth: Payer: Self-pay | Admitting: Neurology

## 2023-01-01 DIAGNOSIS — I890 Lymphedema, not elsewhere classified: Secondary | ICD-10-CM | POA: Diagnosis not present

## 2023-01-01 DIAGNOSIS — E119 Type 2 diabetes mellitus without complications: Secondary | ICD-10-CM | POA: Diagnosis not present

## 2023-01-01 DIAGNOSIS — G40009 Localization-related (focal) (partial) idiopathic epilepsy and epileptic syndromes with seizures of localized onset, not intractable, without status epilepticus: Secondary | ICD-10-CM | POA: Diagnosis not present

## 2023-01-01 DIAGNOSIS — I10 Essential (primary) hypertension: Secondary | ICD-10-CM | POA: Diagnosis not present

## 2023-01-01 DIAGNOSIS — M1990 Unspecified osteoarthritis, unspecified site: Secondary | ICD-10-CM | POA: Diagnosis not present

## 2023-01-01 DIAGNOSIS — M109 Gout, unspecified: Secondary | ICD-10-CM | POA: Diagnosis not present

## 2023-01-01 DIAGNOSIS — H409 Unspecified glaucoma: Secondary | ICD-10-CM | POA: Diagnosis not present

## 2023-01-01 DIAGNOSIS — M48061 Spinal stenosis, lumbar region without neurogenic claudication: Secondary | ICD-10-CM | POA: Diagnosis not present

## 2023-01-01 DIAGNOSIS — G802 Spastic hemiplegic cerebral palsy: Secondary | ICD-10-CM | POA: Diagnosis not present

## 2023-01-01 NOTE — Telephone Encounter (Signed)
Pt has swelling in the left arm from having lymph nodes removed from breast cancer, pt PCP is aware, pt sister is aware, pt has a sleeve that she wears to help with the swelling.

## 2023-01-01 NOTE — Telephone Encounter (Signed)
See other note

## 2023-01-01 NOTE — Telephone Encounter (Signed)
Patient called AN stating that she was turning nurses call

## 2023-01-02 DIAGNOSIS — M1990 Unspecified osteoarthritis, unspecified site: Secondary | ICD-10-CM | POA: Diagnosis not present

## 2023-01-02 DIAGNOSIS — E119 Type 2 diabetes mellitus without complications: Secondary | ICD-10-CM | POA: Diagnosis not present

## 2023-01-02 DIAGNOSIS — G802 Spastic hemiplegic cerebral palsy: Secondary | ICD-10-CM | POA: Diagnosis not present

## 2023-01-02 DIAGNOSIS — M48061 Spinal stenosis, lumbar region without neurogenic claudication: Secondary | ICD-10-CM | POA: Diagnosis not present

## 2023-01-02 DIAGNOSIS — M109 Gout, unspecified: Secondary | ICD-10-CM | POA: Diagnosis not present

## 2023-01-02 DIAGNOSIS — H409 Unspecified glaucoma: Secondary | ICD-10-CM | POA: Diagnosis not present

## 2023-01-02 DIAGNOSIS — G40009 Localization-related (focal) (partial) idiopathic epilepsy and epileptic syndromes with seizures of localized onset, not intractable, without status epilepticus: Secondary | ICD-10-CM | POA: Diagnosis not present

## 2023-01-02 DIAGNOSIS — I890 Lymphedema, not elsewhere classified: Secondary | ICD-10-CM | POA: Diagnosis not present

## 2023-01-02 DIAGNOSIS — I10 Essential (primary) hypertension: Secondary | ICD-10-CM | POA: Diagnosis not present

## 2023-01-07 DIAGNOSIS — G40009 Localization-related (focal) (partial) idiopathic epilepsy and epileptic syndromes with seizures of localized onset, not intractable, without status epilepticus: Secondary | ICD-10-CM | POA: Diagnosis not present

## 2023-01-07 DIAGNOSIS — M48061 Spinal stenosis, lumbar region without neurogenic claudication: Secondary | ICD-10-CM | POA: Diagnosis not present

## 2023-01-07 DIAGNOSIS — G802 Spastic hemiplegic cerebral palsy: Secondary | ICD-10-CM | POA: Diagnosis not present

## 2023-01-07 DIAGNOSIS — I890 Lymphedema, not elsewhere classified: Secondary | ICD-10-CM | POA: Diagnosis not present

## 2023-01-07 DIAGNOSIS — M109 Gout, unspecified: Secondary | ICD-10-CM | POA: Diagnosis not present

## 2023-01-07 DIAGNOSIS — M1990 Unspecified osteoarthritis, unspecified site: Secondary | ICD-10-CM | POA: Diagnosis not present

## 2023-01-07 DIAGNOSIS — I10 Essential (primary) hypertension: Secondary | ICD-10-CM | POA: Diagnosis not present

## 2023-01-07 DIAGNOSIS — E119 Type 2 diabetes mellitus without complications: Secondary | ICD-10-CM | POA: Diagnosis not present

## 2023-01-07 DIAGNOSIS — H409 Unspecified glaucoma: Secondary | ICD-10-CM | POA: Diagnosis not present

## 2023-01-08 DIAGNOSIS — Z23 Encounter for immunization: Secondary | ICD-10-CM | POA: Diagnosis not present

## 2023-01-09 ENCOUNTER — Telehealth: Payer: Self-pay | Admitting: Neurology

## 2023-01-09 DIAGNOSIS — M109 Gout, unspecified: Secondary | ICD-10-CM | POA: Diagnosis not present

## 2023-01-09 DIAGNOSIS — I890 Lymphedema, not elsewhere classified: Secondary | ICD-10-CM | POA: Diagnosis not present

## 2023-01-09 DIAGNOSIS — G802 Spastic hemiplegic cerebral palsy: Secondary | ICD-10-CM | POA: Diagnosis not present

## 2023-01-09 DIAGNOSIS — M48061 Spinal stenosis, lumbar region without neurogenic claudication: Secondary | ICD-10-CM | POA: Diagnosis not present

## 2023-01-09 DIAGNOSIS — G40009 Localization-related (focal) (partial) idiopathic epilepsy and epileptic syndromes with seizures of localized onset, not intractable, without status epilepticus: Secondary | ICD-10-CM | POA: Diagnosis not present

## 2023-01-09 DIAGNOSIS — E119 Type 2 diabetes mellitus without complications: Secondary | ICD-10-CM | POA: Diagnosis not present

## 2023-01-09 DIAGNOSIS — H409 Unspecified glaucoma: Secondary | ICD-10-CM | POA: Diagnosis not present

## 2023-01-09 DIAGNOSIS — I10 Essential (primary) hypertension: Secondary | ICD-10-CM | POA: Diagnosis not present

## 2023-01-09 DIAGNOSIS — M1990 Unspecified osteoarthritis, unspecified site: Secondary | ICD-10-CM | POA: Diagnosis not present

## 2023-01-09 NOTE — Telephone Encounter (Signed)
Ok for verbal orders. The Neurosurgery notes are on Media dated 8/28, pls make sure patient agrees to send their records. Thanks

## 2023-01-09 NOTE — Telephone Encounter (Signed)
Called pt to see if I could send records to Neurosurgery and she agreed. Will release records to  Ron Parker from [facility].

## 2023-01-09 NOTE — Telephone Encounter (Signed)
Called number for Donna Becker and left message to return call.

## 2023-01-09 NOTE — Telephone Encounter (Signed)
Donna Becker from [facility] .  Needs orders for 1x a week for 8 weeks and note from past Ortho doctor [phone]

## 2023-01-10 ENCOUNTER — Telehealth: Payer: Self-pay

## 2023-01-10 NOTE — Telephone Encounter (Signed)
Annabelle Harman from [facility] called for verbal order for PT 1x1 week and 2 times 6 weeks , verbal orders give.

## 2023-01-11 DIAGNOSIS — I890 Lymphedema, not elsewhere classified: Secondary | ICD-10-CM | POA: Diagnosis not present

## 2023-01-11 DIAGNOSIS — E119 Type 2 diabetes mellitus without complications: Secondary | ICD-10-CM | POA: Diagnosis not present

## 2023-01-11 DIAGNOSIS — M1990 Unspecified osteoarthritis, unspecified site: Secondary | ICD-10-CM | POA: Diagnosis not present

## 2023-01-11 DIAGNOSIS — M48061 Spinal stenosis, lumbar region without neurogenic claudication: Secondary | ICD-10-CM | POA: Diagnosis not present

## 2023-01-11 DIAGNOSIS — M109 Gout, unspecified: Secondary | ICD-10-CM | POA: Diagnosis not present

## 2023-01-11 DIAGNOSIS — I10 Essential (primary) hypertension: Secondary | ICD-10-CM | POA: Diagnosis not present

## 2023-01-11 DIAGNOSIS — G802 Spastic hemiplegic cerebral palsy: Secondary | ICD-10-CM | POA: Diagnosis not present

## 2023-01-11 DIAGNOSIS — G40009 Localization-related (focal) (partial) idiopathic epilepsy and epileptic syndromes with seizures of localized onset, not intractable, without status epilepticus: Secondary | ICD-10-CM | POA: Diagnosis not present

## 2023-01-11 DIAGNOSIS — H409 Unspecified glaucoma: Secondary | ICD-10-CM | POA: Diagnosis not present

## 2023-01-13 ENCOUNTER — Other Ambulatory Visit (HOSPITAL_BASED_OUTPATIENT_CLINIC_OR_DEPARTMENT_OTHER): Payer: Self-pay

## 2023-01-13 ENCOUNTER — Other Ambulatory Visit: Payer: Self-pay

## 2023-01-13 ENCOUNTER — Other Ambulatory Visit (HOSPITAL_COMMUNITY): Payer: Self-pay

## 2023-01-14 DIAGNOSIS — M1990 Unspecified osteoarthritis, unspecified site: Secondary | ICD-10-CM | POA: Diagnosis not present

## 2023-01-14 DIAGNOSIS — H409 Unspecified glaucoma: Secondary | ICD-10-CM | POA: Diagnosis not present

## 2023-01-14 DIAGNOSIS — I10 Essential (primary) hypertension: Secondary | ICD-10-CM | POA: Diagnosis not present

## 2023-01-14 DIAGNOSIS — I890 Lymphedema, not elsewhere classified: Secondary | ICD-10-CM | POA: Diagnosis not present

## 2023-01-14 DIAGNOSIS — M48061 Spinal stenosis, lumbar region without neurogenic claudication: Secondary | ICD-10-CM | POA: Diagnosis not present

## 2023-01-14 DIAGNOSIS — M109 Gout, unspecified: Secondary | ICD-10-CM | POA: Diagnosis not present

## 2023-01-14 DIAGNOSIS — G802 Spastic hemiplegic cerebral palsy: Secondary | ICD-10-CM | POA: Diagnosis not present

## 2023-01-14 DIAGNOSIS — G40009 Localization-related (focal) (partial) idiopathic epilepsy and epileptic syndromes with seizures of localized onset, not intractable, without status epilepticus: Secondary | ICD-10-CM | POA: Diagnosis not present

## 2023-01-14 DIAGNOSIS — E119 Type 2 diabetes mellitus without complications: Secondary | ICD-10-CM | POA: Diagnosis not present

## 2023-01-17 DIAGNOSIS — H409 Unspecified glaucoma: Secondary | ICD-10-CM | POA: Diagnosis not present

## 2023-01-17 DIAGNOSIS — G40009 Localization-related (focal) (partial) idiopathic epilepsy and epileptic syndromes with seizures of localized onset, not intractable, without status epilepticus: Secondary | ICD-10-CM | POA: Diagnosis not present

## 2023-01-17 DIAGNOSIS — M109 Gout, unspecified: Secondary | ICD-10-CM | POA: Diagnosis not present

## 2023-01-17 DIAGNOSIS — E119 Type 2 diabetes mellitus without complications: Secondary | ICD-10-CM | POA: Diagnosis not present

## 2023-01-17 DIAGNOSIS — I10 Essential (primary) hypertension: Secondary | ICD-10-CM | POA: Diagnosis not present

## 2023-01-17 DIAGNOSIS — G802 Spastic hemiplegic cerebral palsy: Secondary | ICD-10-CM | POA: Diagnosis not present

## 2023-01-17 DIAGNOSIS — M1990 Unspecified osteoarthritis, unspecified site: Secondary | ICD-10-CM | POA: Diagnosis not present

## 2023-01-17 DIAGNOSIS — I890 Lymphedema, not elsewhere classified: Secondary | ICD-10-CM | POA: Diagnosis not present

## 2023-01-17 DIAGNOSIS — M48061 Spinal stenosis, lumbar region without neurogenic claudication: Secondary | ICD-10-CM | POA: Diagnosis not present

## 2023-01-22 DIAGNOSIS — G40009 Localization-related (focal) (partial) idiopathic epilepsy and epileptic syndromes with seizures of localized onset, not intractable, without status epilepticus: Secondary | ICD-10-CM | POA: Diagnosis not present

## 2023-01-22 DIAGNOSIS — M1990 Unspecified osteoarthritis, unspecified site: Secondary | ICD-10-CM | POA: Diagnosis not present

## 2023-01-22 DIAGNOSIS — H409 Unspecified glaucoma: Secondary | ICD-10-CM | POA: Diagnosis not present

## 2023-01-22 DIAGNOSIS — I10 Essential (primary) hypertension: Secondary | ICD-10-CM | POA: Diagnosis not present

## 2023-01-22 DIAGNOSIS — E119 Type 2 diabetes mellitus without complications: Secondary | ICD-10-CM | POA: Diagnosis not present

## 2023-01-22 DIAGNOSIS — I890 Lymphedema, not elsewhere classified: Secondary | ICD-10-CM | POA: Diagnosis not present

## 2023-01-22 DIAGNOSIS — G802 Spastic hemiplegic cerebral palsy: Secondary | ICD-10-CM | POA: Diagnosis not present

## 2023-01-22 DIAGNOSIS — M109 Gout, unspecified: Secondary | ICD-10-CM | POA: Diagnosis not present

## 2023-01-22 DIAGNOSIS — M48061 Spinal stenosis, lumbar region without neurogenic claudication: Secondary | ICD-10-CM | POA: Diagnosis not present

## 2023-01-29 DIAGNOSIS — H409 Unspecified glaucoma: Secondary | ICD-10-CM | POA: Diagnosis not present

## 2023-01-29 DIAGNOSIS — G40009 Localization-related (focal) (partial) idiopathic epilepsy and epileptic syndromes with seizures of localized onset, not intractable, without status epilepticus: Secondary | ICD-10-CM | POA: Diagnosis not present

## 2023-01-29 DIAGNOSIS — M1990 Unspecified osteoarthritis, unspecified site: Secondary | ICD-10-CM | POA: Diagnosis not present

## 2023-01-29 DIAGNOSIS — M48061 Spinal stenosis, lumbar region without neurogenic claudication: Secondary | ICD-10-CM | POA: Diagnosis not present

## 2023-01-29 DIAGNOSIS — I890 Lymphedema, not elsewhere classified: Secondary | ICD-10-CM | POA: Diagnosis not present

## 2023-01-29 DIAGNOSIS — G802 Spastic hemiplegic cerebral palsy: Secondary | ICD-10-CM | POA: Diagnosis not present

## 2023-01-29 DIAGNOSIS — M109 Gout, unspecified: Secondary | ICD-10-CM | POA: Diagnosis not present

## 2023-01-29 DIAGNOSIS — I10 Essential (primary) hypertension: Secondary | ICD-10-CM | POA: Diagnosis not present

## 2023-01-29 DIAGNOSIS — E119 Type 2 diabetes mellitus without complications: Secondary | ICD-10-CM | POA: Diagnosis not present

## 2023-02-05 ENCOUNTER — Other Ambulatory Visit: Payer: Self-pay

## 2023-02-06 DIAGNOSIS — M109 Gout, unspecified: Secondary | ICD-10-CM | POA: Diagnosis not present

## 2023-02-06 DIAGNOSIS — G802 Spastic hemiplegic cerebral palsy: Secondary | ICD-10-CM | POA: Diagnosis not present

## 2023-02-06 DIAGNOSIS — E119 Type 2 diabetes mellitus without complications: Secondary | ICD-10-CM | POA: Diagnosis not present

## 2023-02-06 DIAGNOSIS — M48061 Spinal stenosis, lumbar region without neurogenic claudication: Secondary | ICD-10-CM | POA: Diagnosis not present

## 2023-02-06 DIAGNOSIS — H409 Unspecified glaucoma: Secondary | ICD-10-CM | POA: Diagnosis not present

## 2023-02-06 DIAGNOSIS — I10 Essential (primary) hypertension: Secondary | ICD-10-CM | POA: Diagnosis not present

## 2023-02-06 DIAGNOSIS — I890 Lymphedema, not elsewhere classified: Secondary | ICD-10-CM | POA: Diagnosis not present

## 2023-02-06 DIAGNOSIS — M1990 Unspecified osteoarthritis, unspecified site: Secondary | ICD-10-CM | POA: Diagnosis not present

## 2023-02-06 DIAGNOSIS — G40009 Localization-related (focal) (partial) idiopathic epilepsy and epileptic syndromes with seizures of localized onset, not intractable, without status epilepticus: Secondary | ICD-10-CM | POA: Diagnosis not present

## 2023-02-10 DIAGNOSIS — M109 Gout, unspecified: Secondary | ICD-10-CM | POA: Diagnosis not present

## 2023-02-10 DIAGNOSIS — G802 Spastic hemiplegic cerebral palsy: Secondary | ICD-10-CM | POA: Diagnosis not present

## 2023-02-10 DIAGNOSIS — M48061 Spinal stenosis, lumbar region without neurogenic claudication: Secondary | ICD-10-CM | POA: Diagnosis not present

## 2023-02-10 DIAGNOSIS — H409 Unspecified glaucoma: Secondary | ICD-10-CM | POA: Diagnosis not present

## 2023-02-10 DIAGNOSIS — M1990 Unspecified osteoarthritis, unspecified site: Secondary | ICD-10-CM | POA: Diagnosis not present

## 2023-02-10 DIAGNOSIS — E119 Type 2 diabetes mellitus without complications: Secondary | ICD-10-CM | POA: Diagnosis not present

## 2023-02-10 DIAGNOSIS — I890 Lymphedema, not elsewhere classified: Secondary | ICD-10-CM | POA: Diagnosis not present

## 2023-02-10 DIAGNOSIS — I10 Essential (primary) hypertension: Secondary | ICD-10-CM | POA: Diagnosis not present

## 2023-02-10 DIAGNOSIS — G40009 Localization-related (focal) (partial) idiopathic epilepsy and epileptic syndromes with seizures of localized onset, not intractable, without status epilepticus: Secondary | ICD-10-CM | POA: Diagnosis not present

## 2023-02-13 ENCOUNTER — Other Ambulatory Visit: Payer: Self-pay

## 2023-02-13 DIAGNOSIS — I890 Lymphedema, not elsewhere classified: Secondary | ICD-10-CM | POA: Diagnosis not present

## 2023-02-13 DIAGNOSIS — H409 Unspecified glaucoma: Secondary | ICD-10-CM | POA: Diagnosis not present

## 2023-02-13 DIAGNOSIS — M1990 Unspecified osteoarthritis, unspecified site: Secondary | ICD-10-CM | POA: Diagnosis not present

## 2023-02-13 DIAGNOSIS — M109 Gout, unspecified: Secondary | ICD-10-CM | POA: Diagnosis not present

## 2023-02-13 DIAGNOSIS — G40009 Localization-related (focal) (partial) idiopathic epilepsy and epileptic syndromes with seizures of localized onset, not intractable, without status epilepticus: Secondary | ICD-10-CM | POA: Diagnosis not present

## 2023-02-13 DIAGNOSIS — E119 Type 2 diabetes mellitus without complications: Secondary | ICD-10-CM | POA: Diagnosis not present

## 2023-02-13 DIAGNOSIS — I10 Essential (primary) hypertension: Secondary | ICD-10-CM | POA: Diagnosis not present

## 2023-02-13 DIAGNOSIS — M48061 Spinal stenosis, lumbar region without neurogenic claudication: Secondary | ICD-10-CM | POA: Diagnosis not present

## 2023-02-13 DIAGNOSIS — G802 Spastic hemiplegic cerebral palsy: Secondary | ICD-10-CM | POA: Diagnosis not present

## 2023-02-17 ENCOUNTER — Other Ambulatory Visit: Payer: Self-pay

## 2023-02-18 ENCOUNTER — Other Ambulatory Visit: Payer: Self-pay

## 2023-02-19 DIAGNOSIS — I890 Lymphedema, not elsewhere classified: Secondary | ICD-10-CM | POA: Diagnosis not present

## 2023-02-19 DIAGNOSIS — G802 Spastic hemiplegic cerebral palsy: Secondary | ICD-10-CM | POA: Diagnosis not present

## 2023-02-19 DIAGNOSIS — M109 Gout, unspecified: Secondary | ICD-10-CM | POA: Diagnosis not present

## 2023-02-19 DIAGNOSIS — H409 Unspecified glaucoma: Secondary | ICD-10-CM | POA: Diagnosis not present

## 2023-02-19 DIAGNOSIS — M48061 Spinal stenosis, lumbar region without neurogenic claudication: Secondary | ICD-10-CM | POA: Diagnosis not present

## 2023-02-19 DIAGNOSIS — M1990 Unspecified osteoarthritis, unspecified site: Secondary | ICD-10-CM | POA: Diagnosis not present

## 2023-02-19 DIAGNOSIS — G40009 Localization-related (focal) (partial) idiopathic epilepsy and epileptic syndromes with seizures of localized onset, not intractable, without status epilepticus: Secondary | ICD-10-CM | POA: Diagnosis not present

## 2023-02-19 DIAGNOSIS — I10 Essential (primary) hypertension: Secondary | ICD-10-CM | POA: Diagnosis not present

## 2023-02-19 DIAGNOSIS — E119 Type 2 diabetes mellitus without complications: Secondary | ICD-10-CM | POA: Diagnosis not present

## 2023-02-24 DIAGNOSIS — M48061 Spinal stenosis, lumbar region without neurogenic claudication: Secondary | ICD-10-CM | POA: Diagnosis not present

## 2023-02-24 DIAGNOSIS — M109 Gout, unspecified: Secondary | ICD-10-CM | POA: Diagnosis not present

## 2023-02-24 DIAGNOSIS — E119 Type 2 diabetes mellitus without complications: Secondary | ICD-10-CM | POA: Diagnosis not present

## 2023-02-24 DIAGNOSIS — I10 Essential (primary) hypertension: Secondary | ICD-10-CM | POA: Diagnosis not present

## 2023-02-24 DIAGNOSIS — G802 Spastic hemiplegic cerebral palsy: Secondary | ICD-10-CM | POA: Diagnosis not present

## 2023-02-24 DIAGNOSIS — G40009 Localization-related (focal) (partial) idiopathic epilepsy and epileptic syndromes with seizures of localized onset, not intractable, without status epilepticus: Secondary | ICD-10-CM | POA: Diagnosis not present

## 2023-02-24 DIAGNOSIS — I890 Lymphedema, not elsewhere classified: Secondary | ICD-10-CM | POA: Diagnosis not present

## 2023-02-24 DIAGNOSIS — M1990 Unspecified osteoarthritis, unspecified site: Secondary | ICD-10-CM | POA: Diagnosis not present

## 2023-02-24 DIAGNOSIS — H409 Unspecified glaucoma: Secondary | ICD-10-CM | POA: Diagnosis not present

## 2023-02-25 DIAGNOSIS — M109 Gout, unspecified: Secondary | ICD-10-CM | POA: Diagnosis not present

## 2023-02-25 DIAGNOSIS — G802 Spastic hemiplegic cerebral palsy: Secondary | ICD-10-CM | POA: Diagnosis not present

## 2023-02-25 DIAGNOSIS — G40009 Localization-related (focal) (partial) idiopathic epilepsy and epileptic syndromes with seizures of localized onset, not intractable, without status epilepticus: Secondary | ICD-10-CM | POA: Diagnosis not present

## 2023-02-25 DIAGNOSIS — I890 Lymphedema, not elsewhere classified: Secondary | ICD-10-CM | POA: Diagnosis not present

## 2023-02-25 DIAGNOSIS — H409 Unspecified glaucoma: Secondary | ICD-10-CM | POA: Diagnosis not present

## 2023-02-25 DIAGNOSIS — M48061 Spinal stenosis, lumbar region without neurogenic claudication: Secondary | ICD-10-CM | POA: Diagnosis not present

## 2023-02-25 DIAGNOSIS — M1990 Unspecified osteoarthritis, unspecified site: Secondary | ICD-10-CM | POA: Diagnosis not present

## 2023-02-25 DIAGNOSIS — E119 Type 2 diabetes mellitus without complications: Secondary | ICD-10-CM | POA: Diagnosis not present

## 2023-02-25 DIAGNOSIS — I10 Essential (primary) hypertension: Secondary | ICD-10-CM | POA: Diagnosis not present

## 2023-02-26 DIAGNOSIS — I10 Essential (primary) hypertension: Secondary | ICD-10-CM | POA: Diagnosis not present

## 2023-02-26 DIAGNOSIS — Z23 Encounter for immunization: Secondary | ICD-10-CM | POA: Diagnosis not present

## 2023-02-26 DIAGNOSIS — Z9989 Dependence on other enabling machines and devices: Secondary | ICD-10-CM | POA: Diagnosis not present

## 2023-02-26 DIAGNOSIS — Z1389 Encounter for screening for other disorder: Secondary | ICD-10-CM | POA: Diagnosis not present

## 2023-02-26 DIAGNOSIS — E119 Type 2 diabetes mellitus without complications: Secondary | ICD-10-CM | POA: Diagnosis not present

## 2023-02-26 DIAGNOSIS — R569 Unspecified convulsions: Secondary | ICD-10-CM | POA: Diagnosis not present

## 2023-02-26 DIAGNOSIS — E7800 Pure hypercholesterolemia, unspecified: Secondary | ICD-10-CM | POA: Diagnosis not present

## 2023-02-26 DIAGNOSIS — I890 Lymphedema, not elsewhere classified: Secondary | ICD-10-CM | POA: Diagnosis not present

## 2023-02-26 DIAGNOSIS — Z0000 Encounter for general adult medical examination without abnormal findings: Secondary | ICD-10-CM | POA: Diagnosis not present

## 2023-02-26 DIAGNOSIS — I350 Nonrheumatic aortic (valve) stenosis: Secondary | ICD-10-CM | POA: Diagnosis not present

## 2023-02-26 DIAGNOSIS — G809 Cerebral palsy, unspecified: Secondary | ICD-10-CM | POA: Diagnosis not present

## 2023-02-26 DIAGNOSIS — Z9181 History of falling: Secondary | ICD-10-CM | POA: Diagnosis not present

## 2023-02-26 DIAGNOSIS — E1169 Type 2 diabetes mellitus with other specified complication: Secondary | ICD-10-CM | POA: Diagnosis not present

## 2023-02-28 DIAGNOSIS — E1169 Type 2 diabetes mellitus with other specified complication: Secondary | ICD-10-CM | POA: Diagnosis not present

## 2023-03-06 DIAGNOSIS — I10 Essential (primary) hypertension: Secondary | ICD-10-CM | POA: Diagnosis not present

## 2023-03-06 DIAGNOSIS — G802 Spastic hemiplegic cerebral palsy: Secondary | ICD-10-CM | POA: Diagnosis not present

## 2023-03-06 DIAGNOSIS — M48061 Spinal stenosis, lumbar region without neurogenic claudication: Secondary | ICD-10-CM | POA: Diagnosis not present

## 2023-03-06 DIAGNOSIS — E119 Type 2 diabetes mellitus without complications: Secondary | ICD-10-CM | POA: Diagnosis not present

## 2023-03-06 DIAGNOSIS — M1990 Unspecified osteoarthritis, unspecified site: Secondary | ICD-10-CM | POA: Diagnosis not present

## 2023-03-06 DIAGNOSIS — H409 Unspecified glaucoma: Secondary | ICD-10-CM | POA: Diagnosis not present

## 2023-03-06 DIAGNOSIS — I890 Lymphedema, not elsewhere classified: Secondary | ICD-10-CM | POA: Diagnosis not present

## 2023-03-06 DIAGNOSIS — G40009 Localization-related (focal) (partial) idiopathic epilepsy and epileptic syndromes with seizures of localized onset, not intractable, without status epilepticus: Secondary | ICD-10-CM | POA: Diagnosis not present

## 2023-03-06 DIAGNOSIS — M109 Gout, unspecified: Secondary | ICD-10-CM | POA: Diagnosis not present

## 2023-03-12 ENCOUNTER — Other Ambulatory Visit: Payer: Self-pay

## 2023-03-12 DIAGNOSIS — H401133 Primary open-angle glaucoma, bilateral, severe stage: Secondary | ICD-10-CM | POA: Diagnosis not present

## 2023-03-12 DIAGNOSIS — H43811 Vitreous degeneration, right eye: Secondary | ICD-10-CM | POA: Diagnosis not present

## 2023-03-12 DIAGNOSIS — E1139 Type 2 diabetes mellitus with other diabetic ophthalmic complication: Secondary | ICD-10-CM | POA: Diagnosis not present

## 2023-03-12 DIAGNOSIS — H16223 Keratoconjunctivitis sicca, not specified as Sjogren's, bilateral: Secondary | ICD-10-CM | POA: Diagnosis not present

## 2023-03-13 ENCOUNTER — Other Ambulatory Visit: Payer: Self-pay

## 2023-03-14 ENCOUNTER — Other Ambulatory Visit (HOSPITAL_COMMUNITY): Payer: Self-pay

## 2023-03-14 ENCOUNTER — Other Ambulatory Visit: Payer: Self-pay

## 2023-03-17 ENCOUNTER — Other Ambulatory Visit: Payer: Self-pay

## 2023-03-18 ENCOUNTER — Other Ambulatory Visit: Payer: Self-pay

## 2023-03-20 ENCOUNTER — Other Ambulatory Visit: Payer: Self-pay

## 2023-03-31 ENCOUNTER — Other Ambulatory Visit: Payer: Self-pay

## 2023-04-01 ENCOUNTER — Ambulatory Visit: Payer: Medicare PPO | Admitting: Podiatry

## 2023-04-02 ENCOUNTER — Other Ambulatory Visit: Payer: Self-pay

## 2023-04-04 ENCOUNTER — Other Ambulatory Visit: Payer: Self-pay

## 2023-04-07 ENCOUNTER — Other Ambulatory Visit: Payer: Self-pay

## 2023-04-08 ENCOUNTER — Other Ambulatory Visit: Payer: Self-pay

## 2023-04-10 ENCOUNTER — Other Ambulatory Visit: Payer: Self-pay

## 2023-04-16 ENCOUNTER — Other Ambulatory Visit (HOSPITAL_COMMUNITY): Payer: Self-pay

## 2023-04-16 ENCOUNTER — Other Ambulatory Visit: Payer: Self-pay | Admitting: Gastroenterology

## 2023-04-17 ENCOUNTER — Other Ambulatory Visit (HOSPITAL_COMMUNITY): Payer: Self-pay

## 2023-04-17 ENCOUNTER — Other Ambulatory Visit: Payer: Self-pay

## 2023-04-17 MED ORDER — BISACODYL 5 MG PO TBEC
DELAYED_RELEASE_TABLET | ORAL | 0 refills | Status: AC
Start: 2023-04-16 — End: ?
  Filled 2023-04-17: qty 4, 1d supply, fill #0

## 2023-04-17 MED ORDER — PEG 3350-KCL-NABCB-NACL-NASULF 236 G PO SOLR
ORAL | 0 refills | Status: AC
  Filled 2023-04-17: qty 4000, 1d supply, fill #0

## 2023-04-18 ENCOUNTER — Encounter (HOSPITAL_COMMUNITY): Payer: Self-pay | Admitting: Gastroenterology

## 2023-04-18 ENCOUNTER — Other Ambulatory Visit (HOSPITAL_COMMUNITY): Payer: Self-pay

## 2023-04-18 NOTE — Progress Notes (Signed)
Pre op call eval Name:Donna Darden Palmer MD Pulmonologist- Dr. Flora Lipps  EKG-11/28/22 Echo-10/08/22 Stress Test-05/26/19 Cath-n/a Blood thinner-n/a GLP-1-n/a *call completed with pt sister, Joyce Gross, who is primary contact  Hx: HTN,DM2,Left BRCA,Aortic Stenosis, Seizures, Cerebral Palsy, hemiplegic (right sided weakness). Patient sees neuro last seen 11/08/22 with a 6 mo f/u, no seizures reported since 2021. Saw cardiology back in 08/2021, they recommended a 1 yr f/u and repeat echo at that time. The echo was done in 2024 but don't see any visits or other notes since. Per pt sister who usually speaks on her behalf, she isn't due to see cardiology, not having any cardiac symptoms currently. Uses a walker for mobility, due to hemiplegia.  Anesthesia Review: Yes

## 2023-04-21 ENCOUNTER — Ambulatory Visit: Payer: Medicare PPO | Admitting: Podiatry

## 2023-04-22 ENCOUNTER — Encounter (HOSPITAL_COMMUNITY)
Admission: RE | Disposition: A | Discharge status: Home / Self Care | Payer: Self-pay | Source: Home / Self Care | Attending: Gastroenterology

## 2023-04-22 ENCOUNTER — Encounter (HOSPITAL_COMMUNITY): Payer: Self-pay | Admitting: Gastroenterology

## 2023-04-22 ENCOUNTER — Ambulatory Visit (HOSPITAL_COMMUNITY)
Admission: RE | Admit: 2023-04-22 | Discharge: 2023-04-22 | Disposition: A | Discharge status: Home / Self Care | Payer: Medicare PPO | Attending: Gastroenterology | Admitting: Gastroenterology

## 2023-04-22 ENCOUNTER — Other Ambulatory Visit: Payer: Self-pay

## 2023-04-22 ENCOUNTER — Ambulatory Visit (HOSPITAL_COMMUNITY): Payer: Self-pay | Admitting: Physician Assistant

## 2023-04-22 DIAGNOSIS — E119 Type 2 diabetes mellitus without complications: Secondary | ICD-10-CM | POA: Insufficient documentation

## 2023-04-22 DIAGNOSIS — Z860100 Personal history of colon polyps, unspecified: Secondary | ICD-10-CM | POA: Diagnosis not present

## 2023-04-22 DIAGNOSIS — D124 Benign neoplasm of descending colon: Secondary | ICD-10-CM

## 2023-04-22 DIAGNOSIS — D122 Benign neoplasm of ascending colon: Secondary | ICD-10-CM | POA: Diagnosis not present

## 2023-04-22 DIAGNOSIS — Z1211 Encounter for screening for malignant neoplasm of colon: Secondary | ICD-10-CM

## 2023-04-22 DIAGNOSIS — R569 Unspecified convulsions: Secondary | ICD-10-CM | POA: Diagnosis not present

## 2023-04-22 DIAGNOSIS — D125 Benign neoplasm of sigmoid colon: Secondary | ICD-10-CM | POA: Insufficient documentation

## 2023-04-22 DIAGNOSIS — Z7984 Long term (current) use of oral hypoglycemic drugs: Secondary | ICD-10-CM | POA: Insufficient documentation

## 2023-04-22 DIAGNOSIS — K640 First degree hemorrhoids: Secondary | ICD-10-CM | POA: Diagnosis not present

## 2023-04-22 DIAGNOSIS — I10 Essential (primary) hypertension: Secondary | ICD-10-CM | POA: Insufficient documentation

## 2023-04-22 DIAGNOSIS — K648 Other hemorrhoids: Secondary | ICD-10-CM | POA: Diagnosis not present

## 2023-04-22 DIAGNOSIS — Z09 Encounter for follow-up examination after completed treatment for conditions other than malignant neoplasm: Secondary | ICD-10-CM | POA: Diagnosis not present

## 2023-04-22 DIAGNOSIS — K635 Polyp of colon: Secondary | ICD-10-CM | POA: Diagnosis not present

## 2023-04-22 DIAGNOSIS — D128 Benign neoplasm of rectum: Secondary | ICD-10-CM | POA: Insufficient documentation

## 2023-04-22 DIAGNOSIS — Q438 Other specified congenital malformations of intestine: Secondary | ICD-10-CM | POA: Diagnosis not present

## 2023-04-22 DIAGNOSIS — Z860101 Personal history of adenomatous and serrated colon polyps: Secondary | ICD-10-CM

## 2023-04-22 DIAGNOSIS — Z87891 Personal history of nicotine dependence: Secondary | ICD-10-CM | POA: Diagnosis not present

## 2023-04-22 HISTORY — PX: COLONOSCOPY WITH PROPOFOL: SHX5780

## 2023-04-22 HISTORY — PX: POLYPECTOMY: SHX5525

## 2023-04-22 HISTORY — PX: BIOPSY: SHX5522

## 2023-04-22 SURGERY — COLONOSCOPY WITH PROPOFOL
Anesthesia: Monitor Anesthesia Care

## 2023-04-22 MED ORDER — PROPOFOL 10 MG/ML IV BOLUS
INTRAVENOUS | Status: DC | PRN
  Administered 2023-04-22 (×4): 20 mg via INTRAVENOUS

## 2023-04-22 MED ORDER — PROPOFOL 1000 MG/100ML IV EMUL
INTRAVENOUS | Status: AC
  Filled 2023-04-22: qty 100

## 2023-04-22 MED ORDER — LIDOCAINE 2% (20 MG/ML) 5 ML SYRINGE
INTRAMUSCULAR | Status: DC | PRN
  Administered 2023-04-22: 100 mg via INTRAVENOUS

## 2023-04-22 MED ORDER — SODIUM CHLORIDE 0.9 % IV SOLN: INTRAVENOUS | Status: DC | PRN

## 2023-04-22 MED ORDER — PROPOFOL 500 MG/50ML IV EMUL
INTRAVENOUS | Status: DC | PRN
  Administered 2023-04-22: 80 ug/kg/min via INTRAVENOUS

## 2023-04-22 SURGICAL SUPPLY — 21 items
ELECT REM PT RETURN 9FT ADLT (ELECTROSURGICAL) IMPLANT
ELECTRODE REM PT RTRN 9FT ADLT (ELECTROSURGICAL) IMPLANT
FCP BXJMBJMB 240X2.8X (CUTTING FORCEPS)
FLOOR PAD 36X40 (MISCELLANEOUS) ×2 IMPLANT
FORCEPS BIOP RAD 4 LRG CAP 4 (CUTTING FORCEPS) IMPLANT
FORCEPS BIOP RJ4 240 W/NDL (CUTTING FORCEPS) IMPLANT
FORCEPS BXJMBJMB 240X2.8X (CUTTING FORCEPS) IMPLANT
INJECTOR/SNARE I SNARE (MISCELLANEOUS) IMPLANT
LUBRICANT JELLY 4.5OZ STERILE (MISCELLANEOUS) IMPLANT
MANIFOLD NEPTUNE II (INSTRUMENTS) IMPLANT
NDL SCLEROTHERAPY 25GX240 (NEEDLE) IMPLANT
NEEDLE SCLEROTHERAPY 25GX240 (NEEDLE) IMPLANT
PAD FLOOR 36X40 (MISCELLANEOUS) ×2 IMPLANT
PROBE APC STR FIRE (PROBE) IMPLANT
PROBE INJECTION GOLD 7FR (MISCELLANEOUS) IMPLANT
SNARE ROTATE MED OVAL 20MM (MISCELLANEOUS) IMPLANT
SYR 50ML LL SCALE MARK (SYRINGE) IMPLANT
TRAP SPECIMEN MUCOUS 40CC (MISCELLANEOUS) IMPLANT
TUBING ENDO SMARTCAP PENTAX (MISCELLANEOUS) IMPLANT
TUBING IRRIGATION ENDOGATOR (MISCELLANEOUS) ×2 IMPLANT
WATER STERILE IRR 1000ML POUR (IV SOLUTION) IMPLANT

## 2023-04-22 NOTE — Op Note (Signed)
[facility] Patient Name: Donna Becker Procedure Date: 04/22/2023 MRN: [id number] Attending MD: Shirley Friar , MD, [id number] Date of Birth: 1945/05/05 CSN: [id number] Age: 78 Admit Type: Outpatient Procedure:                Colonoscopy Indications:              High risk colon cancer surveillance: Personal                            history of adenoma (10 mm or greater in size), Last                            colonoscopy: July 2021 Providers:                Shirley Friar, MD, Suzy Bouchard, RN, Salley Scarlet, Technician, Albertina Senegal. Alday CRNA, CRNA Referring MD:             Renford Dills Medicines:                Propofol per Anesthesia, Monitored Anesthesia Care Complications:            No immediate complications. Estimated Blood Loss:     Estimated blood loss was minimal. Procedure:                Pre-Anesthesia Assessment:                           - Prior to the procedure, a History and Physical                            was performed, and patient medications and                            allergies were reviewed. The patient's tolerance of                            previous anesthesia was also reviewed. The risks                            and benefits of the procedure and the sedation                            options and risks were discussed with the patient.                            All questions were answered, and informed consent                            was obtained. Prior Anticoagulants: The patient has                            taken no anticoagulant or antiplatelet agents. ASA  Grade Assessment: III - A patient with severe                            systemic disease. After reviewing the risks and                            benefits, the patient was deemed in satisfactory                            condition to undergo the procedure.                           After obtaining  informed consent, the colonoscope                            was passed under direct vision. Throughout the                            procedure, the patient's blood pressure, pulse, and                            oxygen saturations were monitored continuously. The                            [id number]-HQ190L ([id number]) Olympus colonoscope was                            introduced through the anus and advanced to the the                            cecum, identified by appendiceal orifice and                            ileocecal valve. The colonoscopy was performed with                            difficulty due to significant looping and a                            tortuous colon. Successful completion of the                            procedure was aided by straightening and shortening                            the scope to obtain bowel loop reduction and                            applying abdominal pressure. The patient tolerated                            the procedure fairly well. The quality of the bowel  preparation was fair and fair but repeated                            irrigation led to a good and adequate prep. The                            ileocecal valve, appendiceal orifice, and rectum                            were photographed. Scope In: 10:28:25 AM Scope Out: 11:14:51 AM Scope Withdrawal Time: 0 hours 28 minutes 28 seconds  Total Procedure Duration: 0 hours 46 minutes 26 seconds  Findings:      The perianal and digital rectal examinations were normal.      Nine sessile and semi-sessile polyps were found in the sigmoid colon,       descending colon and ascending colon. The polyps were 5 to 14 mm in       size. These polyps were removed with a hot snare. Resection and       retrieval were complete. Estimated blood loss: none.      Three flat and semi-sessile polyps were found in the rectum, sigmoid       colon and ascending colon. The polyps were 1 to  4 mm in size. These       polyps were removed with a cold biopsy forceps. Resection and retrieval       were complete. Estimated blood loss was minimal.      Internal hemorrhoids were found during retroflexion. The hemorrhoids       were small and Grade I (internal hemorrhoids that do not prolapse). Impression:               - Preparation of the colon was fair.                           - Nine 5 to 14 mm polyps in the sigmoid colon, in                            the descending colon and in the ascending colon,                            removed with a hot snare. Resected and retrieved.                           - Three 1 to 4 mm polyps in the rectum, in the                            sigmoid colon and in the ascending colon, removed                            with a cold biopsy forceps. Resected and retrieved.                           - Internal hemorrhoids. Moderate Sedation:      N/A - MAC procedure Recommendation:           - Await  pathology results.                           - Patient has a contact number available for                            emergencies. The signs and symptoms of potential                            delayed complications were discussed with the                            patient. Return to normal activities tomorrow.                            Written discharge instructions were provided to the                            patient.                           - High fiber diet.                           - Repeat colonoscopy for surveillance based on                            pathology results.                           - No ibuprofen, naproxen, or other non-steroidal                            anti-inflammatory drugs for 1 week. Procedure Code(s):        --- Professional ---                           [phone], Colonoscopy, flexible; with removal of                            tumor(s), polyp(s), or other lesion(s) by snare                            technique                            45380, 59, Colonoscopy, flexible; with biopsy,                            single or multiple Diagnosis Code(s):        --- Professional ---                           Z86.010, Personal history of colonic polyps                           D12.4, Benign neoplasm of descending colon  D12.8, Benign neoplasm of rectum                           D12.5, Benign neoplasm of sigmoid colon                           D12.2, Benign neoplasm of ascending colon                           K64.0, First degree hemorrhoids CPT copyright 2022 American Medical Association. All rights reserved. The codes documented in this report are preliminary and upon coder review may  be revised to meet current compliance requirements. Shirley Friar, MD 04/22/2023 11:28:27 AM This report has been signed electronically. Number of Addenda: 0

## 2023-04-22 NOTE — Anesthesia Postprocedure Evaluation (Signed)
Anesthesia Post Note  Patient: EULANDA DORION  Procedure(s) Performed: COLONOSCOPY WITH PROPOFOL POLYPECTOMY BIOPSY     Patient location during evaluation: PACU Anesthesia Type: MAC Level of consciousness: awake and alert Pain management: pain level controlled Vital Signs Assessment: post-procedure vital signs reviewed and stable Respiratory status: spontaneous breathing, nonlabored ventilation, respiratory function stable and patient connected to nasal cannula oxygen Cardiovascular status: stable and blood pressure returned to baseline Postop Assessment: no apparent nausea or vomiting Anesthetic complications: no  No notable events documented.  Last Vitals:  Vitals:   04/22/23 1130 04/22/23 1140  BP: (!) 206/60 (!) 233/65  Pulse: (!) 57 (!) 56  Resp: 16 19  Temp:    SpO2: 99% 96%    Last Pain:  Vitals:   04/22/23 1140  TempSrc:   PainSc: 0-No pain                 Shelton Silvas

## 2023-04-22 NOTE — Transfer of Care (Signed)
Immediate Anesthesia Transfer of Care Note  Patient: Donna Becker  Procedure(s) Performed: COLONOSCOPY WITH PROPOFOL POLYPECTOMY BIOPSY  Patient Location: PACU  Anesthesia Type:MAC  Level of Consciousness: sedated  Airway & Oxygen Therapy: Patient Spontanous Breathing and Patient connected to face mask oxygen  Post-op Assessment: Report given to RN and Post -op Vital signs reviewed and stable  Post vital signs: Reviewed and stable  Last Vitals:  Vitals Value Taken Time  BP    Temp    Pulse 54 04/22/23 1120  Resp 21 04/22/23 1120  SpO2 100 % 04/22/23 1120  Vitals shown include unfiled device data.  Last Pain:  Vitals:   04/22/23 0955  TempSrc: Temporal  PainSc: 0-No pain         Complications: No notable events documented.

## 2023-04-22 NOTE — Anesthesia Preprocedure Evaluation (Addendum)
Anesthesia Evaluation  Patient identified by MRN, date of birth, ID band Patient awake    Reviewed: Allergy & Precautions, NPO status , Patient's Chart, lab work & pertinent test results  Airway Mallampati: I  TM Distance: >3 FB Neck ROM: Full    Dental  (+) Upper Dentures, Dental Advisory Given   Pulmonary former smoker   breath sounds clear to auscultation       Cardiovascular hypertension, Pt. on home beta blockers and Pt. on medications  Rhythm:Regular Rate:Normal     Neuro/Psych Seizures -,   Neuromuscular disease  negative psych ROS   GI/Hepatic negative GI ROS, Neg liver ROS,,,  Endo/Other  diabetes, Type 2, Oral Hypoglycemic Agents    Renal/GU negative Renal ROS     Musculoskeletal  (+) Arthritis ,    Abdominal   Peds  Hematology  (+) Blood dyscrasia, anemia   Anesthesia Other Findings   Reproductive/Obstetrics                             Anesthesia Physical Anesthesia Plan  ASA: 3  Anesthesia Plan: MAC   Post-op Pain Management: Minimal or no pain anticipated   Induction: Intravenous  PONV Risk Score and Plan: 0 and Propofol infusion  Airway Management Planned: Natural Airway and Nasal Cannula  Additional Equipment: None  Intra-op Plan:   Post-operative Plan:   Informed Consent: I have reviewed the patients History and Physical, chart, labs and discussed the procedure including the risks, benefits and alternatives for the proposed anesthesia with the patient or authorized representative who has indicated his/her understanding and acceptance.       Plan Discussed with: CRNA  Anesthesia Plan Comments: (Pt hypertensive in preop without end organ decline based on PE. Will proceed. )       Anesthesia Quick Evaluation

## 2023-04-22 NOTE — H&P (Signed)
Date of Initial H&P: 04/16/23  History reviewed, patient examined, no change in status, stable for surgery.

## 2023-04-22 NOTE — Interval H&P Note (Signed)
History and Physical Interval Note:  04/22/2023 10:15 AM  [name]  has presented today for surgery, with the diagnosis of History of colon polyps.  The various methods of treatment have been discussed with the patient and family. After consideration of risks, benefits and other options for treatment, the patient has consented to  Procedure(s): COLONOSCOPY WITH PROPOFOL (N/A) as a surgical intervention.  The patient's history has been reviewed, patient examined, no change in status, stable for surgery.  I have reviewed the patient's chart and labs.  Questions were answered to the patient's satisfaction.     Donna Becker

## 2023-04-22 NOTE — Discharge Instructions (Signed)

## 2023-04-23 ENCOUNTER — Encounter (HOSPITAL_COMMUNITY): Payer: Self-pay | Admitting: Gastroenterology

## 2023-04-23 LAB — SURGICAL PATHOLOGY

## 2023-04-29 ENCOUNTER — Other Ambulatory Visit: Payer: Self-pay

## 2023-05-01 ENCOUNTER — Other Ambulatory Visit: Payer: Self-pay

## 2023-05-19 ENCOUNTER — Other Ambulatory Visit: Payer: Self-pay

## 2023-05-23 ENCOUNTER — Other Ambulatory Visit (HOSPITAL_COMMUNITY): Payer: Self-pay

## 2023-05-23 ENCOUNTER — Other Ambulatory Visit: Payer: Self-pay

## 2023-05-23 MED ORDER — CYCLOSPORINE 0.05 % OP EMUL
1.0000 [drp] | Freq: Two times a day (BID) | OPHTHALMIC | 10 refills | Status: AC
  Filled 2023-05-23: qty 60, 30d supply, fill #0
  Filled 2023-06-19: qty 60, 30d supply, fill #1
  Filled 2023-07-19: qty 60, 30d supply, fill #2
  Filled 2023-08-11 – 2023-08-14 (×2): qty 60, 30d supply, fill #3
  Filled 2023-09-03 – 2023-09-08 (×2): qty 60, 30d supply, fill #4
  Filled 2023-10-13: qty 60, 30d supply, fill #5
  Filled 2023-11-06: qty 60, 30d supply, fill #6
  Filled 2023-12-02: qty 60, 30d supply, fill #7
  Filled 2023-12-30: qty 60, 30d supply, fill #8
  Filled 2024-01-28: qty 60, 30d supply, fill #9
  Filled 2024-02-17 – 2024-02-20 (×2): qty 60, 30d supply, fill #10

## 2023-05-26 ENCOUNTER — Other Ambulatory Visit: Payer: Self-pay

## 2023-05-27 ENCOUNTER — Other Ambulatory Visit: Payer: Self-pay

## 2023-05-29 ENCOUNTER — Other Ambulatory Visit: Payer: Self-pay

## 2023-06-02 ENCOUNTER — Other Ambulatory Visit (HOSPITAL_COMMUNITY): Payer: Self-pay

## 2023-06-02 ENCOUNTER — Ambulatory Visit: Payer: Medicare PPO | Admitting: Neurology

## 2023-06-02 ENCOUNTER — Encounter: Payer: Self-pay | Admitting: Neurology

## 2023-06-02 VITALS — BP 127/64 | HR 63 | Ht 65.0 in | Wt 130.0 lb

## 2023-06-02 DIAGNOSIS — G40009 Localization-related (focal) (partial) idiopathic epilepsy and epileptic syndromes with seizures of localized onset, not intractable, without status epilepticus: Secondary | ICD-10-CM

## 2023-06-02 DIAGNOSIS — G629 Polyneuropathy, unspecified: Secondary | ICD-10-CM

## 2023-06-02 MED ORDER — GABAPENTIN 300 MG PO CAPS
300.0000 mg | ORAL_CAPSULE | Freq: Every evening | ORAL | 3 refills | Status: AC
  Filled 2023-06-02: qty 90, 90d supply, fill #0
  Filled 2023-06-04: qty 30, 30d supply, fill #0
  Filled 2023-07-14 – 2023-07-22 (×2): qty 30, 30d supply, fill #1
  Filled 2023-08-11 – 2023-08-14 (×2): qty 30, 30d supply, fill #2
  Filled 2023-09-30: qty 30, 30d supply, fill #3
  Filled 2023-10-27: qty 30, 30d supply, fill #4
  Filled 2023-12-02: qty 30, 30d supply, fill #5
  Filled 2023-12-30: qty 30, 30d supply, fill #6
  Filled 2024-04-05 (×2): qty 30, 30d supply, fill #7

## 2023-06-02 MED ORDER — CARBAMAZEPINE 200 MG PO TABS
200.0000 mg | ORAL_TABLET | Freq: Three times a day (TID) | ORAL | 3 refills | Status: AC
  Filled 2023-06-02 – 2023-07-14 (×3): qty 270, 90d supply, fill #0
  Filled 2023-07-22: qty 90, 30d supply, fill #0
  Filled 2023-08-11 – 2023-08-14 (×2): qty 90, 30d supply, fill #1
  Filled 2023-09-03 – 2023-09-08 (×2): qty 90, 30d supply, fill #2
  Filled 2023-10-13: qty 90, 30d supply, fill #3
  Filled 2023-11-06: qty 90, 30d supply, fill #4
  Filled 2023-12-02: qty 90, 30d supply, fill #5
  Filled 2023-12-30: qty 90, 30d supply, fill #6
  Filled 2024-01-28: qty 90, 30d supply, fill #7
  Filled 2024-02-17 – 2024-02-20 (×2): qty 90, 30d supply, fill #8
  Filled 2024-03-24: qty 90, 30d supply, fill #9
  Filled 2024-04-20: qty 90, 30d supply, fill #10

## 2023-06-02 NOTE — Patient Instructions (Signed)
 Good to see you.  Continue Carbamazepine 200mg  three times a day  2. Continue Gabapentin 300mg  every night, refills sent  3. Try to do the home exercises  4. Follow-up in 6 months, call for any changes   Seizure Precautions: 1. If medication has been prescribed for you to prevent seizures, take it exactly as directed.  Do not stop taking the medicine without talking to your doctor first, even if you have not had a seizure in a long time.   2. Avoid activities in which a seizure would cause danger to yourself or to others.  Don't operate dangerous machinery, swim alone, or climb in high or dangerous places, such as on ladders, roofs, or girders.  Do not drive unless your doctor says you may.  3. If you have any warning that you may have a seizure, lay down in a safe place where you can't hurt yourself.    4.  No driving for 6 months from last seizure, as per [facility].   Please refer to the following link on the [employer]ile[employer]'s website for more information: http://www.epilepsyfoundation.org/answerplace/Social/driving/drivingu.cfm   5.  Maintain good sleep hygiene.   6.  Contact your doctor if you have any problems that may be related to the medicine you are taking.  7.  Call 911 and bring the patient back to the ED if:        A.  The seizure lasts longer than 5 minutes.       B.  The patient doesn't awaken shortly after the seizure  C.  The patient has new problems such as difficulty seeing, speaking or moving  D.  The patient was injured during the seizure  E.  The patient has a temperature over 102 F (39C)  F.  The patient vomited and now is having trouble breathing

## 2023-06-02 NOTE — Progress Notes (Signed)
 NEUROLOGY FOLLOW UP OFFICE NOTE  Donna Becker [id number] 1945-12-02  HISTORY OF PRESENT ILLNESS: I had the pleasure of seeing Donna Becker in follow-up in the neurology clinic on 06/02/2023.  The patient was last seen on 7 months ago for seizures. She is again accompanied by her sister Donna Becker who helps supplement the history today.  Records and images were personally reviewed where available. Since her last visit, they deny any seizures since 2021 on carbamazepine 200mg  TID, no side effects. No staring/unresponsive episodes. Main issue had been falls and cognitive changes. She has moderate spinal canal stenosi at C-4 and C5-6, severe spinal stenosis at L4-5 and is not a surgical candidate. She had a fall with ER visits in 10/2022, 11/2022, she reports 2 more since then. She notes her left leg just gives way. She ambulates with a walker at home. She states her left side, which is the good side, has arthritis, leading to more falls. She had home PT which Donna Becker feels did help her but she has not been doing the home exercises. She was on Gabapentin 300mg  at bedtime but ran out of refills last month. She notes it helps to calm her. Donna Becker feels she seems clearer off the Gabapentin, with no hallucinations.    History on Initial Assessment 10/28/2019: This is a 78 year old left-handed woman with a history of hypertension, hyperlipidemia, diabetes, breast cancer with left arm lymphedema, cerebral palsy, seizures, presenting for evaluation of seizures, dizziness. Records from [facility] were reviewed, her last visit with epileptologist Dr. Sherlean Foot was in 2011. She was born with right-sided cerebral palsy and started having seizures as a teenager. 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 She also had episodes of staring/confusion lasting up to 15-20 minutes. She was previously on phenobarbital and nitrofurantoin, then has been on Tegretol for many years. In March 2011, she had an  episode of confusion and odd behavior followed by prolonged period of sleepiness. MRI/MRA brain no acute changes. She did not tolerate increasing dose of Tegretol, at that time Levetiracetam 500mg  BID was added. On her visit in 2011, she reported frequent falls, due to left knee pain, she would fall because her right leg cannot support her. She states the doctor at [facility] gave her a white pill and the pain in her left leg was better. She reports the vessels in her left leg pops up, she has compression stockings which are uncomfortable. She is not taking the unrecalled medication since she moved to [place] more than 10 years ago. Gabapentin is on her medication list on [facility], however she does not think she is taking it. It is not on the medication list from Dr. Idelle Crouch last office visit note. She denies any seizures for many, many years. She manages her own medications and states she takes them religiously. She lives with her brother who has not mentioned any staring/unresponsive episodes. She denies any gaps in time, olfactory/gustatory hallucinations, myoclonic jerks. She takes carbamazepine 200mg  1 tab in AM, 2 tabs in PM without side effects.  In April, her sister called Dr. Nehemiah Settle to report sudden onset fainting episodes at home. She fainted and fell suddenly while walking at home, then had another event 2 weeks later. She is alone in the office today and denies any episodes of loss of consciousness. Notes from Dr. Nehemiah Settle were reviewed, she was reporting feeling weak, dizzy, like the room is dizzy. Symptoms typically occur when changing position. Orthostatic vital signs were negative at  her visit. Head CT was ordered but she did not do the test. She states the dizziness is not as much as it was. She has to be very careful with how she moves, she would be in bed and try to move then start spinning. No nausea/vomiting, this does not occur daily. She has had falls but is unsure if they are associated with the  dizziness. She reports frequent falls, last fall was last Monday, she had her cane when she fell. She has to be very careful, before she knows it, she is on the floor, no loss of consciousness. She reports her left leg is so painful. Due to her right sided CP, she has always depended on the left side, but now it is "not my side anymore." She was previously taking aspirin for the pain but stopped since ineffective. She has left-sided back pain and neck pain down her left shoulder. No headaches, diplopia, dysarthria/dysphagia. She reports her last HbA1c was okay. Her last PT session was 2 years ago where she was given exercises for left leg pain, she continues to do them.   MRI lumbar spine in 2012 showed degenerative anterolisthesis and uncovering of the disc at L4-5 and L5-S1; Right greater than left lateral recess narrowing at L4-5; Mild lateral recess narrowing bilaterally at L5-S1; Prominent epidural fat at L4-5 and L5-S1 compatible with epidural lipomatosis; Mild facet hypertrophy L3-4 without significant stenosis.   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 There is no history of febrile convulsions, CNS infections such as meningitis/encephalitis, significant traumatic brain injury, neurosurgical procedures, or family history of seizures.  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 No acute change.   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   MRI lumbar spine without contrast 02/2021 showed increased chronic grade 1 anterolisthesis at L4-L5 and L5-S1 with disc and advanced posterior element degeneration, increased severe  spinal stenosis at L4-L5, with severe lateral recess involvement, query left L5 radiculitis. There was moderate left and mild right L5 foraminal stenosis, mild left L2 foraminal stenosis. There was note of a bulky chronic left renal calculus suspected at the left renal pelvis, 9mm.   PAST MEDICAL HISTORY: Past Medical History:  Diagnosis Date   Arthritis    Cancer (HCC)    left breast cancer /with retained lymph edema left arm   Cerebral palsy, hemiplegic (HCC)    right sided weakness- ambulates with walker   Diabetes mellitus without complication (HCC)    Glaucoma    Gout    History of shingles    Hypertension    Neuromuscular disorder (HCC)    neuropathy, right sided paresis, left arm numbness   Seizure (HCC)    Seizures (HCC)    Transfusion history    '90's when had breast cancer surgery    MEDICATIONS: Current Outpatient Medications on File Prior to Visit  Medication Sig Dispense Refill   allopurinol (ZYLOPRIM) 100 MG tablet Take 100 mg by mouth daily.     allopurinol (ZYLOPRIM) 100 MG tablet Take 1 tablet (100 mg total) by mouth in the morning for gout. 90 tablet 3   B Complex-C (B-COMPLEX WITH VITAMIN C) tablet Take 1 tablet by mouth daily.  bimatoprost (LUMIGAN) 0.01 % SOLN Place 1 drop into both eyes at bedtime.     bisacodyl (DULCOLAX) 5 MG EC tablet Take by mouth as directed per md. 4 tablet 0   carvedilol (COREG) 25 MG tablet TAKE ONE TABLET BY MOUTH TWICE DAILY Needs appointment for further refills 60 tablet 3   carvedilol (COREG) 25 MG tablet Take 1 tablet (25 mg total) by mouth 2 (two) times daily with food. 180 tablet 3   colchicine 0.6 MG tablet Take 0.6 mg by mouth daily.     cycloSPORINE (RESTASIS) 0.05 % ophthalmic emulsion Place 1 drop into both eyes 2 (two) times daily. 60 each 10   furosemide (LASIX) 20 MG tablet Take 20 mg by mouth every morning.     furosemide (LASIX) 20 MG tablet Take 1 tablet (20 mg total) by mouth daily. 90 tablet 3   glipiZIDE  (GLUCOTROL XL) 10 MG 24 hr tablet Take 10 mg by mouth daily.     hydrALAZINE (APRESOLINE) 100 MG tablet Take 100 mg by mouth 2 (two) times daily.     hydrALAZINE (APRESOLINE) 100 MG tablet Take 1 tablet (100 mg total) by mouth 2 (two) times daily, once in the morning and once at noon. 180 tablet 3   Lifitegrast (XIIDRA) 5 % SOLN Instill 1 drop into both eyes twice a day 180 each 6   losartan (COZAAR) 100 MG tablet Take 100 mg by mouth every evening.     metFORMIN (GLUCOPHAGE) 500 MG tablet Take 500 mg by mouth 2 (two) times daily with a meal.     Multiple Vitamin (MULTIVITAMIN WITH MINERALS) TABS tablet Take 1 tablet by mouth daily.     NIFEdipine (ADALAT CC) 90 MG 24 hr tablet Take 1 tablet (90 mg total) by mouth daily. 30 tablet 8   NIFEdipine (ADALAT CC) 90 MG 24 hr tablet Take 1 tablet (90 mg total) by mouth daily on an empty stomach. 90 tablet 3   polyethylene glycol (GOLYTELY) 236 g solution Take as directed per doctor instructions. 4000 mL 0   pravastatin (PRAVACHOL) 20 MG tablet Take 1 tablet (20 mg total) by mouth daily. 90 tablet 3   Probiotic Product (PROBIOTIC DAILY) CAPS Take 1 capsule by mouth daily.     pravastatin (PRAVACHOL) 40 MG tablet Take 40 mg by mouth every evening.     No current facility-administered medications on file prior to visit.    ALLERGIES: Allergies  Allergen Reactions   Codeine Itching and Rash    FAMILY HISTORY: Family History  Problem Relation Age of Onset   Hypertension Mother    Arthritis Sister     SOCIAL HISTORY: Social History   Socioeconomic History   Marital status: Single    Spouse name: Not on file   Number of children: Not on file   Years of education: Not on file   Highest education level: Not on file  Occupational History   Occupation: retired Comptroller  Tobacco Use   Smoking status: Former    Current packs/day: 0.00    Types: Cigarettes    Start date: 01/07/1968    Quit date: 01/06/1969    Years since quitting: 54.4    Smokeless tobacco: Never  Vaping Use   Vaping status: Never Used  Substance and Sexual Activity   Alcohol use: No   Drug use: No   Sexual activity: Not Currently  Other Topics Concern   Not on file  Social History Narrative   Left Handed  One Story Home   No caffeine   Daughter is with her today   Social Drivers of Corporate investment banker Strain: Not on file  Food Insecurity: Not on file  Transportation Needs: Not on file  Physical Activity: Not on file  Stress: Not on file  Social Connections: Not on file  Intimate Partner Violence: Not on file     PHYSICAL EXAM: Vitals:   06/02/23 1420  BP: 127/64  Pulse: 63  SpO2: 97%   General: No acute distress, sitting on wheelchair Head:  Normocephalic/atraumatic Skin/Extremities: No rash, no edema Neurological Exam: alert and awake. No aphasia or dysarthria. Fund of knowledge is appropriate.  Attention and concentration are normal.   Cranial nerves: Pupils equal, round. Extraocular movements intact with no nystagmus. Visual fields full.  No facial asymmetry.  Motor: atrophy on right hand with spasticity (chronic). 5/5 proximal right UE, 3/5 distal right UE, 4/5 left UE, 4/5 right hip flexion, right foot drop (chronic), 3/5 left hip flexion, left foot is everted. Mild ataxia on right finger to nose (chronic). Gait not testing.    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 She has been seizure-free since 2021 on carbamazepine 200mg  TID, refills sent. She continues to have falls, etiology multifactorial, she has neuropathy as well as cervical and lumbar spinal stenosis. Home PT helped, she was encouraged to continue home exercises. Restart Gabapentin 300mg  at bedtime. Continue close supervision, follow-up in 6 months, call for any changes.      Thank you for allowing me to participate in her care.  Please do not hesitate to call for any questions or concerns.    Patrcia Dolly, M.D.   CC: Dr. Nehemiah Settle

## 2023-06-03 ENCOUNTER — Other Ambulatory Visit: Payer: Self-pay

## 2023-06-04 ENCOUNTER — Other Ambulatory Visit (HOSPITAL_COMMUNITY): Payer: Self-pay

## 2023-06-04 ENCOUNTER — Other Ambulatory Visit: Payer: Self-pay

## 2023-06-10 ENCOUNTER — Other Ambulatory Visit (HOSPITAL_COMMUNITY): Payer: Self-pay

## 2023-06-10 ENCOUNTER — Other Ambulatory Visit: Payer: Self-pay

## 2023-06-19 ENCOUNTER — Other Ambulatory Visit: Payer: Self-pay

## 2023-06-26 ENCOUNTER — Other Ambulatory Visit: Payer: Self-pay

## 2023-06-27 ENCOUNTER — Other Ambulatory Visit: Payer: Self-pay

## 2023-06-30 DIAGNOSIS — R296 Repeated falls: Secondary | ICD-10-CM | POA: Diagnosis not present

## 2023-06-30 DIAGNOSIS — I890 Lymphedema, not elsewhere classified: Secondary | ICD-10-CM | POA: Diagnosis not present

## 2023-06-30 DIAGNOSIS — G809 Cerebral palsy, unspecified: Secondary | ICD-10-CM | POA: Diagnosis not present

## 2023-06-30 DIAGNOSIS — I10 Essential (primary) hypertension: Secondary | ICD-10-CM | POA: Diagnosis not present

## 2023-06-30 DIAGNOSIS — I350 Nonrheumatic aortic (valve) stenosis: Secondary | ICD-10-CM | POA: Diagnosis not present

## 2023-06-30 DIAGNOSIS — C50919 Malignant neoplasm of unspecified site of unspecified female breast: Secondary | ICD-10-CM | POA: Diagnosis not present

## 2023-06-30 DIAGNOSIS — R569 Unspecified convulsions: Secondary | ICD-10-CM | POA: Diagnosis not present

## 2023-06-30 DIAGNOSIS — E1169 Type 2 diabetes mellitus with other specified complication: Secondary | ICD-10-CM | POA: Diagnosis not present

## 2023-06-30 DIAGNOSIS — E7800 Pure hypercholesterolemia, unspecified: Secondary | ICD-10-CM | POA: Diagnosis not present

## 2023-07-02 DIAGNOSIS — I10 Essential (primary) hypertension: Secondary | ICD-10-CM | POA: Diagnosis not present

## 2023-07-02 DIAGNOSIS — E1169 Type 2 diabetes mellitus with other specified complication: Secondary | ICD-10-CM | POA: Diagnosis not present

## 2023-07-02 DIAGNOSIS — E7800 Pure hypercholesterolemia, unspecified: Secondary | ICD-10-CM | POA: Diagnosis not present

## 2023-07-06 ENCOUNTER — Encounter: Payer: Self-pay | Admitting: Neurology

## 2023-07-10 ENCOUNTER — Other Ambulatory Visit (HOSPITAL_COMMUNITY): Payer: Self-pay

## 2023-07-10 ENCOUNTER — Other Ambulatory Visit: Payer: Self-pay

## 2023-07-10 MED ORDER — CIPROFLOXACIN HCL 250 MG PO TABS
250.0000 mg | ORAL_TABLET | Freq: Two times a day (BID) | ORAL | 0 refills | Status: DC
  Filled 2023-07-10 – 2023-07-11 (×3): qty 10, 5d supply, fill #0

## 2023-07-11 ENCOUNTER — Other Ambulatory Visit (HOSPITAL_COMMUNITY): Payer: Self-pay

## 2023-07-11 ENCOUNTER — Other Ambulatory Visit: Payer: Self-pay

## 2023-07-11 ENCOUNTER — Encounter: Payer: Self-pay | Admitting: Pharmacist

## 2023-07-14 ENCOUNTER — Other Ambulatory Visit: Payer: Self-pay

## 2023-07-14 DIAGNOSIS — R197 Diarrhea, unspecified: Secondary | ICD-10-CM | POA: Diagnosis not present

## 2023-07-14 DIAGNOSIS — R410 Disorientation, unspecified: Secondary | ICD-10-CM | POA: Diagnosis not present

## 2023-07-19 ENCOUNTER — Other Ambulatory Visit: Payer: Self-pay

## 2023-07-19 ENCOUNTER — Other Ambulatory Visit (HOSPITAL_COMMUNITY): Payer: Self-pay

## 2023-07-20 ENCOUNTER — Other Ambulatory Visit: Payer: Self-pay

## 2023-07-21 DIAGNOSIS — R197 Diarrhea, unspecified: Secondary | ICD-10-CM | POA: Diagnosis not present

## 2023-07-21 DIAGNOSIS — R410 Disorientation, unspecified: Secondary | ICD-10-CM | POA: Diagnosis not present

## 2023-07-22 ENCOUNTER — Other Ambulatory Visit: Payer: Self-pay

## 2023-07-22 ENCOUNTER — Other Ambulatory Visit (HOSPITAL_COMMUNITY): Payer: Self-pay

## 2023-07-28 ENCOUNTER — Other Ambulatory Visit: Payer: Self-pay

## 2023-08-11 ENCOUNTER — Other Ambulatory Visit: Payer: Self-pay

## 2023-08-12 ENCOUNTER — Other Ambulatory Visit: Payer: Self-pay

## 2023-08-12 ENCOUNTER — Other Ambulatory Visit (HOSPITAL_COMMUNITY): Payer: Self-pay

## 2023-08-12 MED ORDER — CIPROFLOXACIN HCL 250 MG PO TABS
250.0000 mg | ORAL_TABLET | Freq: Two times a day (BID) | ORAL | 3 refills | Status: DC
  Filled 2023-08-12 (×2): qty 10, 5d supply, fill #0
  Filled 2023-08-13 – 2023-08-14 (×2): qty 10, 5d supply, fill #1

## 2023-08-13 ENCOUNTER — Other Ambulatory Visit (HOSPITAL_COMMUNITY): Payer: Self-pay

## 2023-08-14 ENCOUNTER — Other Ambulatory Visit (HOSPITAL_COMMUNITY): Payer: Self-pay

## 2023-08-14 ENCOUNTER — Other Ambulatory Visit: Payer: Self-pay

## 2023-09-03 ENCOUNTER — Other Ambulatory Visit: Payer: Self-pay

## 2023-09-08 ENCOUNTER — Other Ambulatory Visit: Payer: Self-pay

## 2023-09-30 ENCOUNTER — Other Ambulatory Visit: Payer: Self-pay

## 2023-10-13 ENCOUNTER — Other Ambulatory Visit: Payer: Self-pay

## 2023-10-14 ENCOUNTER — Other Ambulatory Visit: Payer: Self-pay

## 2023-10-15 ENCOUNTER — Other Ambulatory Visit: Payer: Self-pay

## 2023-10-15 ENCOUNTER — Other Ambulatory Visit (HOSPITAL_COMMUNITY): Payer: Self-pay

## 2023-10-15 MED ORDER — CARVEDILOL 25 MG PO TABS
25.0000 mg | ORAL_TABLET | Freq: Two times a day (BID) | ORAL | 2 refills | Status: AC
  Filled 2023-10-15: qty 180, 90d supply, fill #0
  Filled 2023-10-28: qty 60, 30d supply, fill #0
  Filled 2023-12-02: qty 60, 30d supply, fill #1
  Filled 2023-12-30: qty 60, 30d supply, fill #2
  Filled 2024-02-05: qty 60, 30d supply, fill #3
  Filled 2024-03-03: qty 60, 30d supply, fill #4
  Filled 2024-04-03: qty 60, 30d supply, fill #5

## 2023-10-15 MED ORDER — HYDRALAZINE HCL 100 MG PO TABS
100.0000 mg | ORAL_TABLET | Freq: Two times a day (BID) | ORAL | 2 refills | Status: AC
  Filled 2023-10-15 – 2023-10-22 (×7): qty 180, 90d supply, fill #0
  Filled 2023-10-27: qty 60, 30d supply, fill #0
  Filled 2023-12-02: qty 60, 30d supply, fill #1
  Filled 2023-12-30: qty 60, 30d supply, fill #2
  Filled 2024-02-05: qty 60, 30d supply, fill #3
  Filled 2024-03-03: qty 60, 30d supply, fill #4
  Filled 2024-04-03: qty 60, 30d supply, fill #5

## 2023-10-15 MED ORDER — ALLOPURINOL 100 MG PO TABS
100.0000 mg | ORAL_TABLET | Freq: Every morning | ORAL | 2 refills | Status: AC
  Filled 2023-10-15: qty 90, 90d supply, fill #0
  Filled 2023-10-28: qty 30, 30d supply, fill #0
  Filled 2023-12-02: qty 30, 30d supply, fill #1
  Filled 2023-12-30: qty 30, 30d supply, fill #2
  Filled 2024-02-05: qty 30, 30d supply, fill #3
  Filled 2024-03-03: qty 30, 30d supply, fill #4
  Filled 2024-04-03: qty 30, 30d supply, fill #5

## 2023-10-15 MED ORDER — NIFEDIPINE ER 90 MG PO TB24
90.0000 mg | ORAL_TABLET | Freq: Every day | ORAL | 2 refills | Status: AC
  Filled 2023-10-15: qty 90, 90d supply, fill #0
  Filled 2023-10-28: qty 30, 30d supply, fill #0
  Filled 2023-12-02: qty 30, 30d supply, fill #1
  Filled 2023-12-30: qty 30, 30d supply, fill #2
  Filled 2024-02-05: qty 30, 30d supply, fill #3
  Filled 2024-03-03: qty 30, 30d supply, fill #4
  Filled 2024-04-03: qty 30, 30d supply, fill #5

## 2023-10-15 MED ORDER — FUROSEMIDE 20 MG PO TABS
20.0000 mg | ORAL_TABLET | Freq: Every day | ORAL | 2 refills | Status: AC
  Filled 2023-10-15: qty 90, 90d supply, fill #0
  Filled 2023-10-28: qty 30, 30d supply, fill #0
  Filled 2023-12-02: qty 30, 30d supply, fill #1
  Filled 2023-12-30: qty 30, 30d supply, fill #2
  Filled 2024-02-05: qty 30, 30d supply, fill #3
  Filled 2024-03-03: qty 30, 30d supply, fill #4
  Filled 2024-04-03: qty 30, 30d supply, fill #5

## 2023-10-15 MED ORDER — PRAVASTATIN SODIUM 20 MG PO TABS
20.0000 mg | ORAL_TABLET | Freq: Every day | ORAL | 2 refills | Status: AC
  Filled 2023-10-15 – 2023-10-22 (×7): qty 90, 90d supply, fill #0
  Filled 2023-10-27: qty 30, 30d supply, fill #0
  Filled 2023-12-02: qty 30, 30d supply, fill #1
  Filled 2023-12-30: qty 30, 30d supply, fill #2
  Filled 2024-02-05: qty 30, 30d supply, fill #3
  Filled 2024-03-03: qty 30, 30d supply, fill #4
  Filled 2024-04-03: qty 30, 30d supply, fill #5

## 2023-10-16 ENCOUNTER — Other Ambulatory Visit: Payer: Self-pay

## 2023-10-16 ENCOUNTER — Other Ambulatory Visit (HOSPITAL_COMMUNITY): Payer: Self-pay

## 2023-10-17 ENCOUNTER — Other Ambulatory Visit: Payer: Self-pay

## 2023-10-17 ENCOUNTER — Other Ambulatory Visit (HOSPITAL_COMMUNITY): Payer: Self-pay

## 2023-10-18 ENCOUNTER — Other Ambulatory Visit (HOSPITAL_COMMUNITY): Payer: Self-pay

## 2023-10-20 ENCOUNTER — Other Ambulatory Visit: Payer: Self-pay

## 2023-10-20 ENCOUNTER — Other Ambulatory Visit (HOSPITAL_COMMUNITY): Payer: Self-pay

## 2023-10-21 ENCOUNTER — Other Ambulatory Visit (HOSPITAL_COMMUNITY): Payer: Self-pay

## 2023-10-22 ENCOUNTER — Other Ambulatory Visit (HOSPITAL_COMMUNITY): Payer: Self-pay

## 2023-10-22 ENCOUNTER — Other Ambulatory Visit: Payer: Self-pay

## 2023-10-27 ENCOUNTER — Other Ambulatory Visit: Payer: Self-pay

## 2023-10-28 ENCOUNTER — Other Ambulatory Visit: Payer: Self-pay

## 2023-11-06 ENCOUNTER — Other Ambulatory Visit: Payer: Self-pay

## 2023-11-26 ENCOUNTER — Other Ambulatory Visit: Payer: Self-pay

## 2023-12-02 ENCOUNTER — Other Ambulatory Visit: Payer: Self-pay

## 2023-12-03 ENCOUNTER — Other Ambulatory Visit: Payer: Self-pay

## 2023-12-08 ENCOUNTER — Other Ambulatory Visit: Payer: Self-pay

## 2023-12-24 ENCOUNTER — Other Ambulatory Visit: Payer: Self-pay

## 2023-12-30 ENCOUNTER — Other Ambulatory Visit: Payer: Self-pay

## 2023-12-31 ENCOUNTER — Encounter (HOSPITAL_COMMUNITY): Payer: Self-pay

## 2023-12-31 ENCOUNTER — Other Ambulatory Visit: Payer: Self-pay

## 2023-12-31 ENCOUNTER — Emergency Department (HOSPITAL_COMMUNITY)

## 2023-12-31 ENCOUNTER — Inpatient Hospital Stay (HOSPITAL_COMMUNITY)

## 2023-12-31 ENCOUNTER — Inpatient Hospital Stay (HOSPITAL_COMMUNITY)
Admission: EM | Admit: 2023-12-31 | Discharge: 2024-01-07 | DRG: 521 | Disposition: A | Discharge status: Skilled Nursing Facility | Attending: Family Medicine | Admitting: Family Medicine

## 2023-12-31 DIAGNOSIS — Z7984 Long term (current) use of oral hypoglycemic drugs: Secondary | ICD-10-CM | POA: Diagnosis not present

## 2023-12-31 DIAGNOSIS — Z885 Allergy status to narcotic agent status: Secondary | ICD-10-CM

## 2023-12-31 DIAGNOSIS — I350 Nonrheumatic aortic (valve) stenosis: Secondary | ICD-10-CM | POA: Diagnosis not present

## 2023-12-31 DIAGNOSIS — Z79899 Other long term (current) drug therapy: Secondary | ICD-10-CM

## 2023-12-31 DIAGNOSIS — E1122 Type 2 diabetes mellitus with diabetic chronic kidney disease: Secondary | ICD-10-CM | POA: Diagnosis present

## 2023-12-31 DIAGNOSIS — S72001A Fracture of unspecified part of neck of right femur, initial encounter for closed fracture: Principal | ICD-10-CM | POA: Diagnosis present

## 2023-12-31 DIAGNOSIS — K5900 Constipation, unspecified: Secondary | ICD-10-CM | POA: Diagnosis present

## 2023-12-31 DIAGNOSIS — Z853 Personal history of malignant neoplasm of breast: Secondary | ICD-10-CM

## 2023-12-31 DIAGNOSIS — Z8249 Family history of ischemic heart disease and other diseases of the circulatory system: Secondary | ICD-10-CM | POA: Diagnosis not present

## 2023-12-31 DIAGNOSIS — R0989 Other specified symptoms and signs involving the circulatory and respiratory systems: Secondary | ICD-10-CM | POA: Diagnosis not present

## 2023-12-31 DIAGNOSIS — Z947 Corneal transplant status: Secondary | ICD-10-CM

## 2023-12-31 DIAGNOSIS — W010XXA Fall on same level from slipping, tripping and stumbling without subsequent striking against object, initial encounter: Secondary | ICD-10-CM | POA: Diagnosis present

## 2023-12-31 DIAGNOSIS — N1831 Chronic kidney disease, stage 3a: Secondary | ICD-10-CM | POA: Diagnosis not present

## 2023-12-31 DIAGNOSIS — N179 Acute kidney failure, unspecified: Secondary | ICD-10-CM | POA: Diagnosis present

## 2023-12-31 DIAGNOSIS — E119 Type 2 diabetes mellitus without complications: Secondary | ICD-10-CM | POA: Diagnosis not present

## 2023-12-31 DIAGNOSIS — I5030 Unspecified diastolic (congestive) heart failure: Secondary | ICD-10-CM | POA: Diagnosis not present

## 2023-12-31 DIAGNOSIS — E785 Hyperlipidemia, unspecified: Secondary | ICD-10-CM | POA: Diagnosis present

## 2023-12-31 DIAGNOSIS — I10 Essential (primary) hypertension: Secondary | ICD-10-CM | POA: Diagnosis not present

## 2023-12-31 DIAGNOSIS — W19XXXA Unspecified fall, initial encounter: Principal | ICD-10-CM

## 2023-12-31 DIAGNOSIS — Z87891 Personal history of nicotine dependence: Secondary | ICD-10-CM | POA: Diagnosis not present

## 2023-12-31 DIAGNOSIS — M1711 Unilateral primary osteoarthritis, right knee: Secondary | ICD-10-CM | POA: Diagnosis not present

## 2023-12-31 DIAGNOSIS — M109 Gout, unspecified: Secondary | ICD-10-CM | POA: Diagnosis present

## 2023-12-31 DIAGNOSIS — M7989 Other specified soft tissue disorders: Secondary | ICD-10-CM | POA: Diagnosis not present

## 2023-12-31 DIAGNOSIS — Z9012 Acquired absence of left breast and nipple: Secondary | ICD-10-CM | POA: Diagnosis not present

## 2023-12-31 DIAGNOSIS — S79911A Unspecified injury of right hip, initial encounter: Secondary | ICD-10-CM | POA: Diagnosis not present

## 2023-12-31 DIAGNOSIS — I5032 Chronic diastolic (congestive) heart failure: Secondary | ICD-10-CM | POA: Diagnosis present

## 2023-12-31 DIAGNOSIS — G809 Cerebral palsy, unspecified: Secondary | ICD-10-CM | POA: Diagnosis not present

## 2023-12-31 DIAGNOSIS — H409 Unspecified glaucoma: Secondary | ICD-10-CM | POA: Diagnosis present

## 2023-12-31 DIAGNOSIS — G40909 Epilepsy, unspecified, not intractable, without status epilepticus: Secondary | ICD-10-CM | POA: Diagnosis not present

## 2023-12-31 DIAGNOSIS — E46 Unspecified protein-calorie malnutrition: Secondary | ICD-10-CM | POA: Diagnosis not present

## 2023-12-31 DIAGNOSIS — L89313 Pressure ulcer of right buttock, stage 3: Secondary | ICD-10-CM | POA: Diagnosis not present

## 2023-12-31 DIAGNOSIS — M1A9XX1 Chronic gout, unspecified, with tophus (tophi): Secondary | ICD-10-CM | POA: Diagnosis not present

## 2023-12-31 DIAGNOSIS — Z9049 Acquired absence of other specified parts of digestive tract: Secondary | ICD-10-CM | POA: Diagnosis not present

## 2023-12-31 DIAGNOSIS — Z471 Aftercare following joint replacement surgery: Secondary | ICD-10-CM | POA: Diagnosis not present

## 2023-12-31 DIAGNOSIS — D62 Acute posthemorrhagic anemia: Secondary | ICD-10-CM | POA: Diagnosis present

## 2023-12-31 DIAGNOSIS — G808 Other cerebral palsy: Secondary | ICD-10-CM | POA: Diagnosis present

## 2023-12-31 DIAGNOSIS — I890 Lymphedema, not elsewhere classified: Secondary | ICD-10-CM | POA: Diagnosis present

## 2023-12-31 DIAGNOSIS — I130 Hypertensive heart and chronic kidney disease with heart failure and stage 1 through stage 4 chronic kidney disease, or unspecified chronic kidney disease: Secondary | ICD-10-CM | POA: Diagnosis not present

## 2023-12-31 DIAGNOSIS — Z96641 Presence of right artificial hip joint: Secondary | ICD-10-CM | POA: Diagnosis not present

## 2023-12-31 DIAGNOSIS — I517 Cardiomegaly: Secondary | ICD-10-CM | POA: Diagnosis not present

## 2023-12-31 DIAGNOSIS — R41841 Cognitive communication deficit: Secondary | ICD-10-CM | POA: Diagnosis not present

## 2023-12-31 DIAGNOSIS — G8191 Hemiplegia, unspecified affecting right dominant side: Secondary | ICD-10-CM | POA: Diagnosis not present

## 2023-12-31 DIAGNOSIS — M25561 Pain in right knee: Secondary | ICD-10-CM | POA: Diagnosis not present

## 2023-12-31 DIAGNOSIS — I447 Left bundle-branch block, unspecified: Secondary | ICD-10-CM | POA: Diagnosis not present

## 2023-12-31 DIAGNOSIS — G9341 Metabolic encephalopathy: Secondary | ICD-10-CM | POA: Diagnosis present

## 2023-12-31 DIAGNOSIS — S72041D Displaced fracture of base of neck of right femur, subsequent encounter for closed fracture with routine healing: Secondary | ICD-10-CM | POA: Diagnosis not present

## 2023-12-31 DIAGNOSIS — I700 Atherosclerosis of aorta: Secondary | ICD-10-CM | POA: Diagnosis not present

## 2023-12-31 DIAGNOSIS — S72041A Displaced fracture of base of neck of right femur, initial encounter for closed fracture: Secondary | ICD-10-CM | POA: Diagnosis not present

## 2023-12-31 LAB — CBC WITH DIFFERENTIAL/PLATELET
Abs Immature Granulocytes: 0.03 K/uL (ref 0.00–0.07)
Basophils Absolute: 0 K/uL (ref 0.0–0.1)
Basophils Relative: 0 %
Eosinophils Absolute: 0.1 K/uL (ref 0.0–0.5)
Eosinophils Relative: 1 %
HCT: 34.7 % — ABNORMAL LOW (ref 36.0–46.0)
Hemoglobin: 11.2 g/dL — ABNORMAL LOW (ref 12.0–15.0)
Immature Granulocytes: 0 %
Lymphocytes Relative: 15 %
Lymphs Abs: 1.2 K/uL (ref 0.7–4.0)
MCH: 31 pg (ref 26.0–34.0)
MCHC: 32.3 g/dL (ref 30.0–36.0)
MCV: 96.1 fL (ref 80.0–100.0)
Monocytes Absolute: 0.3 K/uL (ref 0.1–1.0)
Monocytes Relative: 4 %
Neutro Abs: 6.8 K/uL (ref 1.7–7.7)
Neutrophils Relative %: 80 %
Platelets: 283 K/uL (ref 150–400)
RBC: 3.61 MIL/uL — ABNORMAL LOW (ref 3.87–5.11)
RDW: 14.2 % (ref 11.5–15.5)
WBC: 8.4 K/uL (ref 4.0–10.5)
nRBC: 0 % (ref 0.0–0.2)

## 2023-12-31 LAB — TROPONIN I (HIGH SENSITIVITY)
Troponin I (High Sensitivity): 25 ng/L — ABNORMAL HIGH (ref <18)
Troponin I (High Sensitivity): 24 ng/L — ABNORMAL HIGH (ref <18)

## 2023-12-31 LAB — COMPREHENSIVE METABOLIC PANEL WITH GFR
ALT: 33 U/L (ref 0–44)
AST: 39 U/L (ref 15–41)
Albumin: 3.9 g/dL (ref 3.5–5.0)
Alkaline Phosphatase: 133 U/L — ABNORMAL HIGH (ref 38–126)
Anion gap: 15 (ref 5–15)
BUN: 73 mg/dL — ABNORMAL HIGH (ref 8–23)
CO2: 21 mmol/L — ABNORMAL LOW (ref 22–32)
Calcium: 9.3 mg/dL (ref 8.9–10.3)
Chloride: 101 mmol/L (ref 98–111)
Creatinine, Ser: 1.53 mg/dL — ABNORMAL HIGH (ref 0.44–1.00)
GFR, Estimated: 34 mL/min — ABNORMAL LOW (ref >60)
Glucose, Bld: 143 mg/dL — ABNORMAL HIGH (ref 70–99)
Potassium: 4.5 mmol/L (ref 3.5–5.1)
Sodium: 137 mmol/L (ref 135–145)
Total Bilirubin: 0.5 mg/dL (ref 0.0–1.2)
Total Protein: 8.7 g/dL — ABNORMAL HIGH (ref 6.5–8.1)

## 2023-12-31 LAB — MAGNESIUM: Magnesium: 2.2 mg/dL (ref 1.7–2.4)

## 2023-12-31 LAB — BRAIN NATRIURETIC PEPTIDE: B Natriuretic Peptide: 477.2 pg/mL — ABNORMAL HIGH (ref 0.0–100.0)

## 2023-12-31 MED ORDER — CARVEDILOL 25 MG PO TABS
25.0000 mg | ORAL_TABLET | Freq: Two times a day (BID) | ORAL | Status: DC
  Administered 2024-01-01 – 2024-01-07 (×12): 25 mg via ORAL
  Filled 2023-12-31: qty 1
  Filled 2023-12-31: qty 2
  Filled 2023-12-31 (×10): qty 1

## 2023-12-31 MED ORDER — FUROSEMIDE 10 MG/ML IJ SOLN
40.0000 mg | Freq: Once | INTRAMUSCULAR | Status: AC
  Administered 2023-12-31: 40 mg via INTRAVENOUS
  Filled 2023-12-31: qty 4

## 2023-12-31 MED ORDER — ACETAMINOPHEN 325 MG PO TABS
650.0000 mg | ORAL_TABLET | Freq: Four times a day (QID) | ORAL | Status: DC | PRN
  Administered 2023-12-31 – 2024-01-07 (×3): 650 mg via ORAL
  Filled 2023-12-31 (×3): qty 2

## 2023-12-31 MED ORDER — FENTANYL CITRATE PF 50 MCG/ML IJ SOSY
50.0000 ug | PREFILLED_SYRINGE | Freq: Once | INTRAMUSCULAR | Status: AC
  Administered 2023-12-31: 50 ug via INTRAVENOUS
  Filled 2023-12-31: qty 1

## 2023-12-31 MED ORDER — HYDRALAZINE HCL 50 MG PO TABS
100.0000 mg | ORAL_TABLET | Freq: Two times a day (BID) | ORAL | Status: DC
  Administered 2023-12-31 – 2024-01-07 (×14): 100 mg via ORAL
  Filled 2023-12-31 (×14): qty 2

## 2023-12-31 MED ORDER — INSULIN ASPART 100 UNIT/ML IJ SOLN
0.0000 [IU] | Freq: Three times a day (TID) | INTRAMUSCULAR | Status: DC
  Administered 2024-01-04: 2 [IU] via SUBCUTANEOUS
  Administered 2024-01-07: 1 [IU] via SUBCUTANEOUS

## 2023-12-31 MED ORDER — HEPARIN SODIUM (PORCINE) 5000 UNIT/ML IJ SOLN
5000.0000 [IU] | Freq: Three times a day (TID) | INTRAMUSCULAR | Status: DC
  Administered 2023-12-31: 5000 [IU] via SUBCUTANEOUS
  Filled 2023-12-31: qty 1

## 2023-12-31 MED ORDER — HYDROMORPHONE HCL 1 MG/ML IJ SOLN
0.5000 mg | INTRAMUSCULAR | Status: DC | PRN
  Administered 2023-12-31: 0.5 mg via INTRAVENOUS
  Filled 2023-12-31: qty 1

## 2023-12-31 MED ORDER — HEPARIN SODIUM (PORCINE) 5000 UNIT/ML IJ SOLN
5000.0000 [IU] | Freq: Three times a day (TID) | INTRAMUSCULAR | Status: AC
  Administered 2024-01-01: 5000 [IU] via SUBCUTANEOUS
  Filled 2023-12-31: qty 1

## 2023-12-31 MED ORDER — GABAPENTIN 300 MG PO CAPS: 300.0000 mg | ORAL_CAPSULE | Freq: Every day | ORAL | Status: DC

## 2023-12-31 MED ORDER — LIFITEGRAST 5 % OP SOLN: 1.0000 [drp] | Freq: Two times a day (BID) | OPHTHALMIC | Status: DC

## 2023-12-31 MED ORDER — CYCLOSPORINE 0.05 % OP EMUL
1.0000 [drp] | Freq: Two times a day (BID) | OPHTHALMIC | Status: DC
  Administered 2024-01-01 – 2024-01-07 (×13): 1 [drp] via OPHTHALMIC
  Filled 2023-12-31 (×15): qty 30

## 2023-12-31 MED ORDER — HYDROMORPHONE HCL 2 MG PO TABS
2.0000 mg | ORAL_TABLET | ORAL | Status: DC | PRN
  Administered 2024-01-01: 2 mg via ORAL
  Filled 2023-12-31: qty 1

## 2023-12-31 MED ORDER — SENNOSIDES-DOCUSATE SODIUM 8.6-50 MG PO TABS: 1.0000 | ORAL_TABLET | Freq: Every evening | ORAL | Status: DC | PRN

## 2023-12-31 MED ORDER — PRAVASTATIN SODIUM 40 MG PO TABS
20.0000 mg | ORAL_TABLET | Freq: Every day | ORAL | Status: DC
  Administered 2024-01-01 – 2024-01-07 (×7): 20 mg via ORAL
  Filled 2023-12-31 (×3): qty 1
  Filled 2023-12-31: qty 2
  Filled 2023-12-31 (×3): qty 1

## 2023-12-31 MED ORDER — NIFEDIPINE ER OSMOTIC RELEASE 60 MG PO TB24
90.0000 mg | ORAL_TABLET | Freq: Every day | ORAL | Status: DC
  Administered 2024-01-01 – 2024-01-07 (×8): 90 mg via ORAL
  Filled 2023-12-31 (×9): qty 1

## 2023-12-31 MED ORDER — ACETAMINOPHEN 650 MG RE SUPP: 650.0000 mg | Freq: Four times a day (QID) | RECTAL | Status: DC | PRN

## 2023-12-31 MED ORDER — CARBAMAZEPINE 200 MG PO TABS
200.0000 mg | ORAL_TABLET | Freq: Three times a day (TID) | ORAL | Status: DC
Start: 2023-12-31 — End: 2024-01-07
  Administered 2023-12-31 – 2024-01-07 (×19): 200 mg via ORAL
  Filled 2023-12-31 (×21): qty 1

## 2023-12-31 MED ORDER — NIFEDIPINE ER OSMOTIC RELEASE 60 MG PO TB24: 90.0000 mg | ORAL_TABLET | Freq: Every day | ORAL | Status: DC

## 2023-12-31 NOTE — ED Triage Notes (Addendum)
 Pt bib [facility] coming from home after pt had unwitnessed fall. Pt unsure of LOC but not on thinners. Pt c/o right sided hip pain but no other complaints at this time. EMS reports GCS 15 but patient disoriented to time.   EMS VS: 120/50 60 HR 94 cbg 95% RA fentanyl 

## 2023-12-31 NOTE — ED Notes (Signed)
 Pt had incontinent episode. Pt provided peri care and full linen change.

## 2023-12-31 NOTE — H&P (Addendum)
 History and Physical    [name]:[id number] DOB: 1945-10-26 DOA: 12/31/2023  DOS: the patient was seen and examined on 12/31/2023  PCP: Rexanne Ingle, MD   Patient coming from: Home  I have personally briefly reviewed patient's old medical records in [facility] and CareEverywhere  HPI:   MASHAWN Becker is a 78 y.o. year old female with past medical history of hypertension, hyperlipidemia, type 2 diabetes, HFpEF and moderate aortic stenosis presenting to ED after a fall and complained of right hip pain.   Patient states she was walking and her leg gave out.  Denies any other concerns except for the right hip pain.  States she needs help with some ADLs and IADLs which is provided by her sister who visits her frequently.  She is unsure of her medications but states she takes them regularly.  ED Course: On arrival to [facility] ED patient was hemodynamically stable.  Given her left hip pain x-ray of the left hip was performed that showed displaced right femoral neck fracture.  CBC was obtained that showed chronic anemia.  CMP showed hyperglycemia, elevated creatinine with last creatinine level around 1 year ago that was normal.  Patient with elevated total protein and alkaline phosphatase as well.  Consultants/recommendations/plan: Orthopedic surgery consulted  [facility] contacted for admission.  Review of Systems: As mentioned in the history of present illness. All other systems reviewed and are negative.    Past Medical History:  Diagnosis Date   Arthritis    Cancer (HCC)    left breast cancer /with retained lymph edema left arm   Cerebral palsy, hemiplegic (HCC)    right sided weakness- ambulates with walker   Diabetes mellitus without complication (HCC)    Glaucoma    Gout    History of shingles    Hypertension    Neuromuscular disorder (HCC)    neuropathy, right sided paresis, left arm numbness   Seizure (HCC)    Seizures (HCC)    Transfusion history     '90's when had breast cancer surgery    Past Surgical History:  Procedure Laterality Date   BIOPSY  09/29/2019   Procedure: BIOPSY;  Surgeon: Dianna Specking, MD;  Location: [facility];  Service: Endoscopy;;   BIOPSY  04/22/2023   Procedure: BIOPSY;  Surgeon: Dianna Specking, MD;  Location: [facility];  Service: Gastroenterology;;   BREAST SURGERY Left    s/p left mastectomy   CHOLECYSTECTOMY     open    COLONOSCOPY WITH PROPOFOL  N/A 09/29/2019   Procedure: COLONOSCOPY WITH PROPOFOL ;  Surgeon: Dianna Specking, MD;  Location: [facility];  Service: Endoscopy;  Laterality: N/A;   COLONOSCOPY WITH PROPOFOL  N/A 04/22/2023   Procedure: COLONOSCOPY WITH PROPOFOL ;  Surgeon: Dianna Specking, MD;  Location: [facility];  Service: Gastroenterology;  Laterality: N/A;   ESOPHAGOGASTRODUODENOSCOPY (EGD) WITH PROPOFOL  N/A 09/29/2019   Procedure: ESOPHAGOGASTRODUODENOSCOPY (EGD) WITH PROPOFOL ;  Surgeon: Dianna Specking, MD;  Location: [facility];  Service: Endoscopy;  Laterality: N/A;   EYE SURGERY Right    corneal transplant   FLEXIBLE SIGMOIDOSCOPY N/A 01/26/2013   Procedure: FLEXIBLE SIGMOIDOSCOPY;  Surgeon: Gladis MARLA Louder, MD;  Location: [facility];  Service: Endoscopy;  Laterality: N/A;  no sedation   POLYPECTOMY  09/29/2019   Procedure: POLYPECTOMY;  Surgeon: Dianna Specking, MD;  Location: [facility];  Service: Endoscopy;;   POLYPECTOMY  04/22/2023   Procedure: POLYPECTOMY;  Surgeon: Dianna Specking, MD;  Location: [facility];  Service: Gastroenterology;;   port-a-cath  implantation and removal   TUBAL LIGATION       Allergies  Allergen Reactions   Codeine Itching and Rash    Family History  Problem Relation Age of Onset   Hypertension Mother    Arthritis Sister     Prior to Admission medications   Medication Sig Start Date End Date Taking? Authorizing Provider  allopurinol  (ZYLOPRIM ) 100 MG tablet Take 100 mg by mouth daily.    [provider]   allopurinol  (ZYLOPRIM ) 100 MG tablet Take 1 tablet (100 mg total) by mouth in the morning for gout. 10/15/23     B Complex-C (B-COMPLEX WITH VITAMIN C) tablet Take 1 tablet by mouth daily.    [provider]  bimatoprost (LUMIGAN) 0.01 % SOLN Place 1 drop into both eyes at bedtime.    [provider]  bisacodyl  (DULCOLAX) 5 MG EC tablet Take by mouth as directed per md. 04/16/23     carbamazepine  (TEGRETOL ) 200 MG tablet Take 1 tablet (200 mg total) by mouth 3 (three) times daily for seizure. 06/02/23   Georjean Darice HERO, MD  carvedilol  (COREG ) 25 MG tablet TAKE ONE TABLET BY MOUTH TWICE DAILY Needs appointment for further refills 10/15/21   O'Neal, Darryle Ned, MD  carvedilol  (COREG ) 25 MG tablet Take 1 tablet (25 mg total) by mouth 2 (two) times daily with food. 10/15/23     ciprofloxacin  (CIPRO ) 250 MG tablet Take 1 tablet (250 mg total) by mouth every 12 (twelve) hours. 08/12/23     colchicine 0.6 MG tablet Take 0.6 mg by mouth daily.    [provider]  cycloSPORINE  (RESTASIS ) 0.05 % ophthalmic emulsion Place 1 drop into both eyes 2 (two) times daily. 05/23/23     furosemide  (LASIX ) 20 MG tablet Take 20 mg by mouth every morning.    [provider]  furosemide  (LASIX ) 20 MG tablet Take 1 tablet (20 mg total) by mouth daily. 10/15/23     gabapentin  (NEURONTIN ) 300 MG capsule Take 1 capsule (300 mg total) by mouth at night. 06/02/23   Georjean Darice HERO, MD  glipiZIDE (GLUCOTROL XL) 10 MG 24 hr tablet Take 10 mg by mouth daily.    [provider]  hydrALAZINE  (APRESOLINE ) 100 MG tablet Take 100 mg by mouth 2 (two) times daily. 09/08/19   [provider]  hydrALAZINE  (APRESOLINE ) 100 MG tablet Take 1 tablet (100 mg total) by mouth 2 (two) times daily, once in the morning and once at noon. 10/15/23     Lifitegrast  (XIIDRA ) 5 % SOLN Instill 1 drop into both eyes twice a day 12/17/22     losartan (COZAAR) 100 MG tablet Take 100 mg by mouth every evening.     [provider]  metFORMIN (GLUCOPHAGE) 500 MG tablet Take 500 mg by mouth 2 (two) times daily with a meal.    [provider]  Multiple Vitamin (MULTIVITAMIN WITH MINERALS) TABS tablet Take 1 tablet by mouth daily.    [provider]  NIFEdipine  (ADALAT  CC) 90 MG 24 hr tablet Take 1 tablet (90 mg total) by mouth daily. 01/28/22   O'NealDarryle Ned, MD  NIFEdipine  (ADALAT  CC) 90 MG 24 hr tablet Take 1 tablet (90 mg total) by mouth daily on an empty stomach. 10/15/23     polyethylene glycol (GOLYTELY) 236 g solution Take as directed per doctor instructions. 04/16/23     pravastatin  (PRAVACHOL ) 20 MG tablet Take 1 tablet (20 mg total) by mouth daily. 10/15/23  pravastatin  (PRAVACHOL ) 40 MG tablet Take 40 mg by mouth every evening.    [provider]  Probiotic Product (PROBIOTIC DAILY) CAPS Take 1 capsule by mouth daily.    [provider]      reports that she quit smoking about 55 years ago. Her smoking use included cigarettes. She started smoking about 56 years ago. She has never used smokeless tobacco. She reports that she does not drink alcohol and does not use drugs. Lives with brother. Currently retired Tobacco- Denies use. Previous smoking hx of 1/2 ppd but quit 20 years.  EtOH-  Occasional wine Illicit drug use- denies use.  IADLs/ADLs- Needs help with ADLs and iADLs   Physical Exam: Vitals:   12/31/23 1900 12/31/23 1915 12/31/23 2000 12/31/23 2008  BP: (!) 181/73 (!) 157/67 (!) 165/73   Pulse: 75 75 74   Resp: 17 17 18    Temp:    98.1 F (36.7 C)  TempSrc:    Oral  SpO2: 97% 97% 97%   Height:         Gen: elderly female in slight distress HENT: NCAT CV: regular rate and rhythm, systolic murmur 4/6 in aortic region. Good pulses in all extremities.  Resp:bibasilar crackles Abd:no TTP MSK: right leg twisted, able to move toes of right foot. Diminished sensation on right lower extremity which is chronic for the patient. left  upper extremity with swelling and tenderness Skin: no rashes seen Neuro:alert and oriented x4 Psych: normal mood   Labs on Admission: I have personally reviewed following labs and imaging studies  CBC: Recent Labs  Lab 12/31/23 1731  WBC 8.4  NEUTROABS 6.8  HGB 11.2*  HCT 34.7*  MCV 96.1  PLT 283   Basic Metabolic Panel: Recent Labs  Lab 12/31/23 1731  NA 137  K 4.5  CL 101  CO2 21*  GLUCOSE 143*  BUN 73*  CREATININE 1.53*  CALCIUM 9.3   GFR: CrCl cannot be calculated (Unknown ideal weight.). Liver Function Tests: Recent Labs  Lab 12/31/23 1731  AST 39  ALT 33  ALKPHOS 133*  BILITOT 0.5  PROT 8.7*  ALBUMIN 3.9   No results for input(s): LIPASE, AMYLASE in the last 168 hours. No results for input(s): AMMONIA in the last 168 hours. Coagulation Profile: No results for input(s): INR, PROTIME in the last 168 hours. Cardiac Enzymes: Recent Labs  Lab 12/31/23 1926  TROPONINIHS 25*   BNP (last 3 results) Recent Labs    12/31/23 1926  BNP 477.2*   HbA1C: No results for input(s): HGBA1C in the last 72 hours. CBG: No results for input(s): GLUCAP in the last 168 hours. Lipid Profile: No results for input(s): CHOL, HDL, LDLCALC, TRIG, CHOLHDL, LDLDIRECT in the last 72 hours. Thyroid  Function Tests: No results for input(s): TSH, T4TOTAL, FREET4, T3FREE, THYROIDAB in the last 72 hours. Anemia Panel: No results for input(s): VITAMINB12, FOLATE, FERRITIN, TIBC, IRON, RETICCTPCT in the last 72 hours. Urine analysis:    Component Value Date/Time   COLORURINE AMBER (A) 11/29/2022 0419   APPEARANCEUR HAZY (A) 11/29/2022 0419   LABSPEC 1.017 11/29/2022 0419   PHURINE 5.0 11/29/2022 0419   GLUCOSEU NEGATIVE 11/29/2022 0419   HGBUR NEGATIVE 11/29/2022 0419   BILIRUBINUR NEGATIVE 11/29/2022 0419   KETONESUR NEGATIVE 11/29/2022 0419   PROTEINUR 100 (A) 11/29/2022 0419   UROBILINOGEN 0.2 01/13/2011 1322   NITRITE  NEGATIVE 11/29/2022 0419   LEUKOCYTESUR NEGATIVE 11/29/2022 0419    Radiological Exams on Admission: I have personally reviewed  images DG Chest 1 View Result Date: 12/31/2023 CLINICAL DATA:  Fall with right hip fracture. EXAM: CHEST  1 VIEW COMPARISON:  11/22/2022 FINDINGS: Cardiomegaly is stable. Unchanged mediastinal contours, aortic atherosclerosis. Unchanged vascular congestion. Right infrahilar atelectasis. No confluent consolidation. No pneumothorax or pleural effusion. Left axillary surgical clips. The bones are under mineralized. IMPRESSION: Cardiomegaly and vascular congestion. Right infrahilar atelectasis. Electronically Signed   By: Andrea Gasman M.D.   On: 12/31/2023 17:15   DG Hip Unilat W or Wo Pelvis 2-3 Views Right Result Date: 12/31/2023 CLINICAL DATA:  Status post fall with suspected hip fracture. EXAM: DG HIP (WITH OR WITHOUT PELVIS) 2-3V RIGHT COMPARISON:  None Available. FINDINGS: Technically limited due to positioning. Displaced right femoral neck fracture. Approximately 3 cm proximal migration of the femoral shaft. Femoral head is well seated. Bony pelvis including pubic rami are grossly intact. The bones are subjectively under mineralized. IMPRESSION: Displaced right femoral neck fracture. Electronically Signed   By: Andrea Gasman M.D.   On: 12/31/2023 17:14    EKG: My personal interpretation of EKG shows: Sinus rhythm with slightly prolonged PR interval and widened QRS complex from right bundle branch block and left posterior fascicular block.    Assessment/Plan Principal Problem:   Displaced fracture of neck of right femur (HCC) Active Problems:   Essential hypertension   Hyperlipidemia   T2DM (type 2 diabetes mellitus) (HCC)   Lymphedema of arm   Displaced right femoral neck fracture Patient with right femoral neck fracture status post fall.  Orthopedic surgery consulted.  Plan for ORIF tomorrow.  N.p.o. after midnight.  Pain regimen ordered.  Can escalate  if needed.  Given patient volume overload, will give patient IV Lasix  to optimize her cardiac status.  Given her aortic stenosis and murmur noted on exam.  Will get an echo.  May need to involve cardiology based on echo results but pt may need to proceed with surgery with elevated cardiac risk given her injury. Charlanne periop risk at 2.8 %.   CHF exacerbation: Pt with mild CHF exacerbation. BNP ordered. Will give 1 dose of IV lasix . Will get strict I/O, daily weights and re-dose based on response. Echo ordered as above. Last echo around a year ago showed normal EF with GIIDD and mild to moderate aortic stenosis.   AKI versus CKD: Patient with creatinine elevation which may be secondary to an AKI versus CKD.  It is a difficult to differentiate as patient's last creatinine is over 1 year ago.  Patient is volume overloaded and this may be secondary to that.  Will give her IV Lasix  and trend her creatinine daily.  Renally dose medications and avoid nephrotoxic medications.  Will hold her gabapentin  and the dose when AKI resolves or CKD is established.  Chronic problems: Hypertension: Elevated.  Restart home antihypertensives Hyperlipidemia: Restart home statin Type 2 diabetes: Repeat A1c.  Placed on SSI. Lymphedema: left sided lymphedema.  Will get left upper extremity Doppler given concern for DVT. Gout: Holding home allupurinol given AKI. Re-dose if needed based on creatinine if pt has CKD.   VTE prophylaxis:  SQ Heparin, hold after midnight. Restart after surgery  Diet: Regular, NPO after midnight Code Status:  Full Code Telemetry:  Admission status: Inpatient, Telemetry bed Patient is from: Home  Anticipated d/c is to: TBD  Anticipated d/c is in: 2-3    Family Communication: Updated at bedside   Consults called: Orthopedic surgery   Severity of Illness: The appropriate patient status for this patient  is INPATIENT. Inpatient status is judged to be reasonable and necessary in order to  provide the required intensity of service to ensure the patient's safety. The patient's presenting symptoms, physical exam findings, and initial radiographic and laboratory data in the context of their chronic comorbidities is felt to place them at high risk for further clinical deterioration. Furthermore, it is not anticipated that the patient will be medically stable for discharge from the hospital within 2 midnights of admission.   * I certify that at the point of admission it is my clinical judgment that the patient will require inpatient hospital care spanning beyond 2 midnights from the point of admission due to high intensity of service, high risk for further deterioration and high frequency of surveillance required.DEWAINE Morene Bathe, MD Jolynn DEL. [facility]

## 2023-12-31 NOTE — ED Provider Notes (Signed)
 [place] EMERGENC[facility] DEPARTMENT AT [facility] Provider Note   CSN: [id number] Arrival date & time: 12/31/23  1545     Patient presents with: Donna Becker is a 78 y.o. female.   The history is provided by the patient and medical records. No language interpreter was used.  Fall This is a new problem. The current episode started less than 1 hour ago. Pertinent negatives include no chest pain, no abdominal pain, no headaches and no shortness of breath. Nothing aggravates the symptoms. She has tried nothing for the symptoms. The treatment provided no relief.       Prior to Admission medications   Medication Sig Start Date End Date Taking? Authorizing Provider  allopurinol  (ZYLOPRIM ) 100 MG tablet Take 100 mg by mouth daily.    [provider]  allopurinol  (ZYLOPRIM ) 100 MG tablet Take 1 tablet (100 mg total) by mouth in the morning for gout. 10/15/23     B Complex-C (B-COMPLEX WITH VITAMIN C) tablet Take 1 tablet by mouth daily.    [provider]  bimatoprost (LUMIGAN) 0.01 % SOLN Place 1 drop into both eyes at bedtime.    [provider]  bisacodyl  (DULCOLAX) 5 MG EC tablet Take by mouth as directed per md. 04/16/23     carbamazepine  (TEGRETOL ) 200 MG tablet Take 1 tablet (200 mg total) by mouth 3 (three) times daily for seizure. 06/02/23   Georjean Darice HERO, MD  carvedilol  (COREG ) 25 MG tablet TAKE ONE TABLET BY MOUTH TWICE DAILY Needs appointment for further refills 10/15/21   O'Neal, Darryle Ned, MD  carvedilol  (COREG ) 25 MG tablet Take 1 tablet (25 mg total) by mouth 2 (two) times daily with food. 10/15/23     ciprofloxacin  (CIPRO ) 250 MG tablet Take 1 tablet (250 mg total) by mouth every 12 (twelve) hours. 08/12/23     colchicine 0.6 MG tablet Take 0.6 mg by mouth daily.    [provider]  cycloSPORINE  (RESTASIS ) 0.05 % ophthalmic emulsion Place 1 drop into both eyes 2 (two) times daily. 05/23/23     furosemide  (LASIX ) 20 MG  tablet Take 20 mg by mouth every morning.    [provider]  furosemide  (LASIX ) 20 MG tablet Take 1 tablet (20 mg total) by mouth daily. 10/15/23     gabapentin  (NEURONTIN ) 300 MG capsule Take 1 capsule (300 mg total) by mouth at night. 06/02/23   Georjean Darice HERO, MD  glipiZIDE (GLUCOTROL XL) 10 MG 24 hr tablet Take 10 mg by mouth daily.    [provider]  hydrALAZINE  (APRESOLINE ) 100 MG tablet Take 100 mg by mouth 2 (two) times daily. 09/08/19   [provider]  hydrALAZINE  (APRESOLINE ) 100 MG tablet Take 1 tablet (100 mg total) by mouth 2 (two) times daily, once in the morning and once at noon. 10/15/23     Lifitegrast  (XIIDRA ) 5 % SOLN Instill 1 drop into both eyes twice a day 12/17/22     losartan (COZAAR) 100 MG tablet Take 100 mg by mouth every evening.    [provider]  metFORMIN (GLUCOPHAGE) 500 MG tablet Take 500 mg by mouth 2 (two) times daily with a meal.    [provider]  Multiple Vitamin (MULTIVITAMIN WITH MINERALS) TABS tablet Take 1 tablet by mouth daily.    [provider]  NIFEdipine  (ADALAT  CC) 90 MG 24 hr tablet Take 1 tablet (90 mg total) by mouth daily. 01/28/22   O'Neal, Darryle Ned,  MD  NIFEdipine  (ADALAT  CC) 90 MG 24 hr tablet Take 1 tablet (90 mg total) by mouth daily on an empty stomach. 10/15/23     polyethylene glycol (GOLYTELY) 236 g solution Take as directed per doctor instructions. 04/16/23     pravastatin  (PRAVACHOL ) 20 MG tablet Take 1 tablet (20 mg total) by mouth daily. 10/15/23     pravastatin  (PRAVACHOL ) 40 MG tablet Take 40 mg by mouth every evening.    [provider]  Probiotic Product (PROBIOTIC DAILY) CAPS Take 1 capsule by mouth daily.    [provider]    Allergies: Codeine    Review of Systems  Constitutional:  Negative for chills, fatigue and fever.  HENT:  Negative for congestion.   Respiratory:  Negative for cough, chest tightness, shortness of breath and wheezing.    Cardiovascular:  Negative for chest pain and palpitations.  Gastrointestinal:  Negative for abdominal pain, constipation, diarrhea, nausea and vomiting.  Genitourinary:  Negative for dysuria and flank pain.  Musculoskeletal:  Negative for back pain, neck pain and neck stiffness.  Skin:  Negative for rash and wound.  Neurological:  Positive for weakness (at baseline) and numbness (at baseline). Negative for light-headedness and headaches.  Psychiatric/Behavioral:  Negative for agitation and confusion.   All other systems reviewed and are negative.   Updated Vital Signs BP (!) 143/62   Pulse 65   Temp 98.5 F (36.9 C) (Oral)   Resp 14   Ht 5' 5 (1.651 m)   SpO2 96%   BMI 21.63 kg/m   Physical Exam Vitals and nursing note reviewed.  Constitutional:      General: She is not in acute distress.    Appearance: She is well-developed. She is not ill-appearing, toxic-appearing or diaphoretic.  HENT:     Head: Normocephalic and atraumatic.     Nose: No congestion or rhinorrhea.     Mouth/Throat:     Mouth: Mucous membranes are moist.     Pharynx: No oropharyngeal exudate or posterior oropharyngeal erythema.  Eyes:     Extraocular Movements: Extraocular movements intact.     Conjunctiva/sclera: Conjunctivae normal.     Pupils: Pupils are equal, round, and reactive to light.  Cardiovascular:     Rate and Rhythm: Normal rate and regular rhythm.     Heart sounds: No murmur heard. Pulmonary:     Effort: Pulmonary effort is normal. No respiratory distress.     Breath sounds: Normal breath sounds. No wheezing, rhonchi or rales.  Chest:     Chest wall: No tenderness.  Abdominal:     General: Abdomen is flat.     Palpations: Abdomen is soft.     Tenderness: There is no abdominal tenderness. There is no right CVA tenderness, left CVA tenderness, guarding or rebound.  Musculoskeletal:        General: Swelling (l arm chonic per pt) and tenderness present.     Cervical back: Neck supple.  No tenderness.     Right hip: Tenderness present.       Legs:     Comments: Significant tenderness in right hip with both palpation and any manipulation.  Distally she had intact pulses and had her baseline numbness and weakness that she reports is unchanged from her baseline cerebral palsy deficits.  Also numbness and weakness in her right arm unchanged from her baseline.  Skin:    General: Skin is warm and dry.     Capillary Refill: Capillary refill takes less than 2  seconds.  Neurological:     Mental Status: She is alert.  Psychiatric:        Mood and Affect: Mood normal.     (all labs ordered are listed, but only abnormal results are displayed) Labs Reviewed  CBC WITH DIFFERENTIAL/PLATELET - Abnormal; Notable for the following components:      Result Value   RBC 3.61 (*)    Hemoglobin 11.2 (*)    HCT 34.7 (*)    All other components within normal limits  COMPREHENSIVE METABOLIC PANEL WITH GFR - Abnormal; Notable for the following components:   CO2 21 (*)    Glucose, Bld 143 (*)    BUN 73 (*)    Creatinine, Ser 1.53 (*)    Total Protein 8.7 (*)    Alkaline Phosphatase 133 (*)    GFR, Estimated 34 (*)    All other components within normal limits  TROPONIN I (HIGH SENSITIVITY)    EKG: EKG Interpretation Date/Time:  Wednesday December 31 2023 16:00:33 EDT Ventricular Rate:  65 PR Interval:  217 QRS Duration:  177 QT Interval:  518 QTC Calculation: 539 R Axis:   176  Text Interpretation: Sinus rhythm Borderline prolonged PR interval RBBB and LPFB Consider left ventricular hypertrophy Abnormal T, probable ischemia, lateral leads Prolonged QT interval when compared to prior, more pronounced t wave inversions and ST abnormalities No STEMI Confirmed by Ginger Barefoot ([id number]) on 12/31/2023 4:06:17 PM  Radiology: DG Chest 1 View Result Date: 12/31/2023 CLINICAL DATA:  Fall with right hip fracture. EXAM: CHEST  1 VIEW COMPARISON:  11/22/2022 FINDINGS: Cardiomegaly is stable.  Unchanged mediastinal contours, aortic atherosclerosis. Unchanged vascular congestion. Right infrahilar atelectasis. No confluent consolidation. No pneumothorax or pleural effusion. Left axillary surgical clips. The bones are under mineralized. IMPRESSION: Cardiomegaly and vascular congestion. Right infrahilar atelectasis. Electronically Signed   By: Andrea Gasman M.D.   On: 12/31/2023 17:15   DG Hip Unilat W or Wo Pelvis 2-3 Views Right Result Date: 12/31/2023 CLINICAL DATA:  Status post fall with suspected hip fracture. EXAM: DG HIP (WITH OR WITHOUT PELVIS) 2-3V RIGHT COMPARISON:  None Available. FINDINGS: Technically limited due to positioning. Displaced right femoral neck fracture. Approximately 3 cm proximal migration of the femoral shaft. Femoral head is well seated. Bony pelvis including pubic rami are grossly intact. The bones are subjectively under mineralized. IMPRESSION: Displaced right femoral neck fracture. Electronically Signed   By: Andrea Gasman M.D.   On: 12/31/2023 17:14     Procedures   Medications Ordered in the ED  fentaNYL  (SUBLIMAZE ) injection 50 mcg (has no administration in time range)  fentaNYL  (SUBLIMAZE ) injection 50 mcg (50 mcg Intravenous Given 12/31/23 1729)                                    Medical Decision Making Amount and/or Complexity of Data Reviewed Labs: ordered. Radiology: ordered.  Risk Prescription drug management. Decision regarding hospitalization.    Donna Becker is a 78 y.o. female with a past medical history significant for cerebral palsy with persistent and chronic right sided numbness and weakness in her right arm and right leg, previous breast cancer with lymphedema left arm, previous seizures, diabetes, hypertension, glaucoma, and gout who presents with fall and right hip pain.  According to patient and EMS report, patient was walking and had a mechanical fall falling onto her right hip.  EMS was concerned about deformity and  gave her fentanyl  and route.  For me, patient is only complaining of the right hip pain.  She reports her numbness and weakness in her right arm and right leg are unchanged from her baseline but she cannot bear any weight and can barely move the right leg at the hip.  She denies any ankle pain, foot pain, or knee pain.  She denies any pain in her head, neck, chest or back.  She denies any abdominal pain or left leg pain.  On my exam, she has significant tenderness to her right hip and has pain with any manipulation.  Distally she had intact pulses and has unchanged numbness and weakness from her baseline per her report.  Left side had lymphedema in the left arm but otherwise had intact sensation and strength in the upper and lower extremity.  Symmetric smile.  Clear speech.  Patient denies any headache or neck pain and did not hit her head.  Denies loss of consciousness.  Patient otherwise denies any preceding symptoms such as fevers, chills, congestion, cough, nausea, vomiting, constipation, diarrhea, or urinary changes.  She says that she had just gotten up to try to move some laundry when she had this mechanical fall.  Given the exam I am concerned about hip fracture.  Will get a chest x-ray for suspected need for surgical clearance and will get other screening labs.  Will give her more pain medicine.  Anticipate reassessment for workup to determine disposition.   5:55 PM I viewed the x-rays and concern for hip fracture.  X-ray report does indeed show a right femoral neck fracture.  I called and spoke with orthopedics and spoke to Dr. Beverley who reports the patient can be n.p.o. at midnight, get admitted to medicine, and they will likely plan on doing surgery tomorrow on it.  They will see her in the morning.  When labs have returned, will admit to medicine.  Labs returned and did show mild AKI.  X-ray did not show pneumonia clearly.  Due to the abnormal EKG, the admitting team I spoke with  will order troponin although given her lack of chest pain or shortness of breath we will hold on ordering of the Emergency Department.  Medicine will admit for further anticipate she will likely have surgery in the morning as recommended by orthopedics.     Final diagnoses:  Fall, initial encounter  Closed fracture of right hip, initial encounter [facility])     Clinical Impression: 1. Fall, initial encounter   2. Closed fracture of right hip, initial encounter [facility])     Disposition: Admit  This note was prepared with assistance of Dragon voice recognition software. Occasional wrong-word or sound-a-like substitutions may have occurred due to the inherent limitations of voice recognition software.      Chae Oommen, Lonni PARAS, MD 12/31/23 [phone]

## 2024-01-01 ENCOUNTER — Inpatient Hospital Stay (HOSPITAL_COMMUNITY)

## 2024-01-01 ENCOUNTER — Encounter (HOSPITAL_COMMUNITY): Payer: Self-pay | Admitting: Internal Medicine

## 2024-01-01 ENCOUNTER — Other Ambulatory Visit: Payer: Self-pay

## 2024-01-01 ENCOUNTER — Encounter (HOSPITAL_COMMUNITY)
Admission: EM | Disposition: A | Discharge status: Skilled Nursing Facility | Payer: Self-pay | Source: Home / Self Care | Attending: Internal Medicine

## 2024-01-01 DIAGNOSIS — Z87891 Personal history of nicotine dependence: Secondary | ICD-10-CM | POA: Diagnosis not present

## 2024-01-01 DIAGNOSIS — I10 Essential (primary) hypertension: Secondary | ICD-10-CM | POA: Diagnosis not present

## 2024-01-01 DIAGNOSIS — S72001A Fracture of unspecified part of neck of right femur, initial encounter for closed fracture: Secondary | ICD-10-CM

## 2024-01-01 DIAGNOSIS — E785 Hyperlipidemia, unspecified: Secondary | ICD-10-CM | POA: Diagnosis not present

## 2024-01-01 HISTORY — PX: TOTAL HIP ARTHROPLASTY: SHX124

## 2024-01-01 LAB — BASIC METABOLIC PANEL WITH GFR
Anion gap: 16 — ABNORMAL HIGH (ref 5–15)
BUN: 71 mg/dL — ABNORMAL HIGH (ref 8–23)
CO2: 23 mmol/L (ref 22–32)
Calcium: 9.7 mg/dL (ref 8.9–10.3)
Chloride: 100 mmol/L (ref 98–111)
Creatinine, Ser: 1.64 mg/dL — ABNORMAL HIGH (ref 0.44–1.00)
GFR, Estimated: 32 mL/min — ABNORMAL LOW (ref >60)
Glucose, Bld: 118 mg/dL — ABNORMAL HIGH (ref 70–99)
Potassium: 4.2 mmol/L (ref 3.5–5.1)
Sodium: 139 mmol/L (ref 135–145)

## 2024-01-01 LAB — CBC
HCT: 36.2 % (ref 36.0–46.0)
HCT: 33.6 % — ABNORMAL LOW (ref 36.0–46.0)
Hemoglobin: 11.9 g/dL — ABNORMAL LOW (ref 12.0–15.0)
Hemoglobin: 10.9 g/dL — ABNORMAL LOW (ref 12.0–15.0)
MCH: 30.9 pg (ref 26.0–34.0)
MCH: 31.1 pg (ref 26.0–34.0)
MCHC: 32.9 g/dL (ref 30.0–36.0)
MCHC: 32.4 g/dL (ref 30.0–36.0)
MCV: 94 fL (ref 80.0–100.0)
MCV: 95.7 fL (ref 80.0–100.0)
Platelets: 298 K/uL (ref 150–400)
Platelets: 254 K/uL (ref 150–400)
RBC: 3.85 MIL/uL — ABNORMAL LOW (ref 3.87–5.11)
RBC: 3.51 MIL/uL — ABNORMAL LOW (ref 3.87–5.11)
RDW: 14.3 % (ref 11.5–15.5)
RDW: 14.4 % (ref 11.5–15.5)
WBC: 6.1 K/uL (ref 4.0–10.5)
WBC: 11.5 K/uL — ABNORMAL HIGH (ref 4.0–10.5)
nRBC: 0 % (ref 0.0–0.2)
nRBC: 0 % (ref 0.0–0.2)

## 2024-01-01 LAB — SURGICAL PCR SCREEN
MRSA, PCR: NEGATIVE
Staphylococcus aureus: NEGATIVE

## 2024-01-01 LAB — ABO/RH
ABO/RH(D): A POS
ABO/RH(D): A POS

## 2024-01-01 LAB — HEMOGLOBIN A1C
Hgb A1c MFr Bld: 5.5 % (ref 4.8–5.6)
Mean Plasma Glucose: 111.15 mg/dL

## 2024-01-01 LAB — GLUCOSE, CAPILLARY
Glucose-Capillary: 137 mg/dL — ABNORMAL HIGH (ref 70–99)
Glucose-Capillary: 155 mg/dL — ABNORMAL HIGH (ref 70–99)
Glucose-Capillary: 139 mg/dL — ABNORMAL HIGH (ref 70–99)

## 2024-01-01 LAB — CREATININE, SERUM
Creatinine, Ser: 1.52 mg/dL — ABNORMAL HIGH (ref 0.44–1.00)
GFR, Estimated: 35 mL/min — ABNORMAL LOW (ref >60)

## 2024-01-01 LAB — TYPE AND SCREEN
ABO/RH(D): A POS
Antibody Screen: NEGATIVE

## 2024-01-01 LAB — CBG MONITORING, ED: Glucose-Capillary: 104 mg/dL — ABNORMAL HIGH (ref 70–99)

## 2024-01-01 LAB — VITAMIN D 25 HYDROXY (VIT D DEFICIENCY, FRACTURES): Vit D, 25-Hydroxy: 14.54 ng/mL — ABNORMAL LOW (ref 30–100)

## 2024-01-01 SURGERY — ARTHROPLASTY, HIP, TOTAL, ANTERIOR APPROACH
Anesthesia: General | Site: Hip | Laterality: Right

## 2024-01-01 MED ORDER — ONDANSETRON HCL 4 MG/2ML IJ SOLN
INTRAMUSCULAR | Status: DC | PRN
  Administered 2024-01-01: 4 mg via INTRAVENOUS

## 2024-01-01 MED ORDER — CHLORHEXIDINE GLUCONATE 0.12 % MT SOLN
OROMUCOSAL | Status: AC
  Administered 2024-01-01: 15 mL via OROMUCOSAL
  Filled 2024-01-01: qty 15

## 2024-01-01 MED ORDER — ENOXAPARIN SODIUM 40 MG/0.4ML IJ SOSY
40.0000 mg | PREFILLED_SYRINGE | INTRAMUSCULAR | Status: DC
  Administered 2024-01-02 – 2024-01-07 (×6): 40 mg via SUBCUTANEOUS
  Filled 2024-01-01 (×6): qty 0.4

## 2024-01-01 MED ORDER — ORAL CARE MOUTH RINSE: 15.0000 mL | Freq: Once | OROMUCOSAL | Status: AC

## 2024-01-01 MED ORDER — INSULIN ASPART 100 UNIT/ML IJ SOLN: 0.0000 [IU] | INTRAMUSCULAR | Status: DC | PRN

## 2024-01-01 MED ORDER — METOCLOPRAMIDE HCL 5 MG/ML IJ SOLN: 5.0000 mg | Freq: Three times a day (TID) | INTRAMUSCULAR | Status: DC | PRN

## 2024-01-01 MED ORDER — LIDOCAINE 2% (20 MG/ML) 5 ML SYRINGE
INTRAMUSCULAR | Status: DC | PRN
  Administered 2024-01-01: 60 mg via INTRAVENOUS

## 2024-01-01 MED ORDER — OXYCODONE HCL 5 MG PO TABS
2.5000 mg | ORAL_TABLET | ORAL | Status: DC | PRN
  Administered 2024-01-02 – 2024-01-07 (×14): 5 mg via ORAL
  Filled 2024-01-01 (×15): qty 1

## 2024-01-01 MED ORDER — METHOCARBAMOL 1000 MG/10ML IJ SOLN: 500.0000 mg | Freq: Four times a day (QID) | INTRAMUSCULAR | Status: DC | PRN

## 2024-01-01 MED ORDER — ONDANSETRON HCL 4 MG/2ML IJ SOLN: 4.0000 mg | Freq: Four times a day (QID) | INTRAMUSCULAR | Status: DC | PRN

## 2024-01-01 MED ORDER — CHLORHEXIDINE GLUCONATE 4 % EX SOLN: 60.0000 mL | Freq: Once | CUTANEOUS | Status: DC

## 2024-01-01 MED ORDER — POVIDONE-IODINE 10 % EX SWAB
2.0000 | Freq: Once | CUTANEOUS | Status: AC
  Administered 2024-01-01: 2 via TOPICAL

## 2024-01-01 MED ORDER — MENTHOL 3 MG MT LOZG: 1.0000 | LOZENGE | OROMUCOSAL | Status: DC | PRN

## 2024-01-01 MED ORDER — ALBUMIN HUMAN 5 % IV SOLN
INTRAVENOUS | Status: DC | PRN
Start: 2024-01-01 — End: 2024-01-01

## 2024-01-01 MED ORDER — 0.9 % SODIUM CHLORIDE (POUR BTL) OPTIME
TOPICAL | Status: DC | PRN
  Administered 2024-01-01: 1000 mL

## 2024-01-01 MED ORDER — OXYCODONE HCL 5 MG PO TABS: 5.0000 mg | ORAL_TABLET | Freq: Once | ORAL | Status: DC | PRN

## 2024-01-01 MED ORDER — PHENYLEPHRINE HCL-NACL 20-0.9 MG/250ML-% IV SOLN
INTRAVENOUS | Status: DC | PRN
  Administered 2024-01-01: 25 ug/min via INTRAVENOUS

## 2024-01-01 MED ORDER — CEFAZOLIN SODIUM-DEXTROSE 2-4 GM/100ML-% IV SOLN
2.0000 g | INTRAVENOUS | Status: AC
  Administered 2024-01-01: 2 g via INTRAVENOUS
  Filled 2024-01-01: qty 100

## 2024-01-01 MED ORDER — CEFAZOLIN SODIUM-DEXTROSE 2-4 GM/100ML-% IV SOLN
2.0000 g | Freq: Four times a day (QID) | INTRAVENOUS | Status: AC
  Administered 2024-01-01 – 2024-01-02 (×2): 2 g via INTRAVENOUS
  Filled 2024-01-01 (×2): qty 100

## 2024-01-01 MED ORDER — ONDANSETRON HCL 4 MG PO TABS: 4.0000 mg | ORAL_TABLET | Freq: Four times a day (QID) | ORAL | Status: DC | PRN

## 2024-01-01 MED ORDER — SUGAMMADEX SODIUM 200 MG/2ML IV SOLN
INTRAVENOUS | Status: DC | PRN
  Administered 2024-01-01: 200 mg via INTRAVENOUS

## 2024-01-01 MED ORDER — EPHEDRINE SULFATE-NACL 50-0.9 MG/10ML-% IV SOSY
PREFILLED_SYRINGE | INTRAVENOUS | Status: DC | PRN
  Administered 2024-01-01 (×2): 10 mg via INTRAVENOUS

## 2024-01-01 MED ORDER — MAGNESIUM CITRATE PO SOLN: 1.0000 | Freq: Once | ORAL | Status: DC | PRN

## 2024-01-01 MED ORDER — DIPHENHYDRAMINE HCL 12.5 MG/5ML PO ELIX: 12.5000 mg | ORAL_SOLUTION | ORAL | Status: DC | PRN

## 2024-01-01 MED ORDER — DROPERIDOL 2.5 MG/ML IJ SOLN: 0.6250 mg | Freq: Once | INTRAMUSCULAR | Status: DC | PRN

## 2024-01-01 MED ORDER — PROPOFOL 10 MG/ML IV BOLUS
INTRAVENOUS | Status: AC
  Filled 2024-01-01: qty 20

## 2024-01-01 MED ORDER — FENTANYL CITRATE (PF) 250 MCG/5ML IJ SOLN
INTRAMUSCULAR | Status: AC
  Filled 2024-01-01: qty 5

## 2024-01-01 MED ORDER — VANCOMYCIN HCL 1000 MG IV SOLR
INTRAVENOUS | Status: AC
Start: 2024-01-01 — End: 2024-01-01
  Filled 2024-01-01: qty 20

## 2024-01-01 MED ORDER — FENTANYL CITRATE (PF) 250 MCG/5ML IJ SOLN
INTRAMUSCULAR | Status: DC | PRN
Start: 2024-01-01 — End: 2024-01-01
  Administered 2024-01-01: 100 ug via INTRAVENOUS
  Administered 2024-01-01: 50 ug via INTRAVENOUS

## 2024-01-01 MED ORDER — VANCOMYCIN HCL 1000 MG IV SOLR
INTRAVENOUS | Status: DC | PRN
  Administered 2024-01-01: 1000 mg via TOPICAL

## 2024-01-01 MED ORDER — METHOCARBAMOL 500 MG PO TABS
500.0000 mg | ORAL_TABLET | Freq: Four times a day (QID) | ORAL | Status: DC | PRN
  Administered 2024-01-02 – 2024-01-07 (×6): 500 mg via ORAL
  Filled 2024-01-01 (×6): qty 1

## 2024-01-01 MED ORDER — ACETAMINOPHEN 10 MG/ML IV SOLN: 1000.0000 mg | Freq: Once | INTRAVENOUS | Status: DC | PRN

## 2024-01-01 MED ORDER — METOCLOPRAMIDE HCL 5 MG PO TABS: 5.0000 mg | ORAL_TABLET | Freq: Three times a day (TID) | ORAL | Status: DC | PRN

## 2024-01-01 MED ORDER — LACTATED RINGERS IV SOLN: INTRAVENOUS | Status: DC

## 2024-01-01 MED ORDER — TRANEXAMIC ACID-NACL 1000-0.7 MG/100ML-% IV SOLN
1000.0000 mg | INTRAVENOUS | Status: AC
  Administered 2024-01-01: 1000 mg via INTRAVENOUS
  Filled 2024-01-01: qty 100

## 2024-01-01 MED ORDER — ROCURONIUM BROMIDE 10 MG/ML (PF) SYRINGE
PREFILLED_SYRINGE | INTRAVENOUS | Status: DC | PRN
  Administered 2024-01-01: 40 mg via INTRAVENOUS
  Administered 2024-01-01 (×2): 10 mg via INTRAVENOUS

## 2024-01-01 MED ORDER — PROPOFOL 10 MG/ML IV BOLUS
INTRAVENOUS | Status: DC | PRN
  Administered 2024-01-01: 60 mg via INTRAVENOUS

## 2024-01-01 MED ORDER — PHENOL 1.4 % MT LIQD: 1.0000 | OROMUCOSAL | Status: DC | PRN

## 2024-01-01 MED ORDER — DEXAMETHASONE SOD PHOSPHATE PF 10 MG/ML IJ SOLN
INTRAMUSCULAR | Status: DC | PRN
  Administered 2024-01-01: 5 mg via INTRAVENOUS

## 2024-01-01 MED ORDER — HYDROMORPHONE HCL 1 MG/ML IJ SOLN
0.5000 mg | INTRAMUSCULAR | Status: DC | PRN
  Administered 2024-01-02: 0.5 mg via INTRAVENOUS
  Filled 2024-01-01: qty 0.5

## 2024-01-01 MED ORDER — DOCUSATE SODIUM 100 MG PO CAPS
100.0000 mg | ORAL_CAPSULE | Freq: Two times a day (BID) | ORAL | Status: DC
  Administered 2024-01-01 – 2024-01-07 (×12): 100 mg via ORAL
  Filled 2024-01-01 (×12): qty 1

## 2024-01-01 MED ORDER — OXYCODONE HCL 5 MG/5ML PO SOLN: 5.0000 mg | Freq: Once | ORAL | Status: DC | PRN

## 2024-01-01 MED ORDER — SODIUM CHLORIDE 0.9 % IV SOLN: INTRAVENOUS | Status: AC

## 2024-01-01 MED ORDER — STERILE WATER FOR IRRIGATION IR SOLN
Status: DC | PRN
  Administered 2024-01-01: 1000 mL

## 2024-01-01 MED ORDER — FENTANYL CITRATE (PF) 100 MCG/2ML IJ SOLN
INTRAMUSCULAR | Status: AC
  Filled 2024-01-01: qty 2

## 2024-01-01 MED ORDER — FENTANYL CITRATE (PF) 100 MCG/2ML IJ SOLN
25.0000 ug | INTRAMUSCULAR | Status: DC | PRN
  Administered 2024-01-01 (×2): 25 ug via INTRAVENOUS

## 2024-01-01 MED ORDER — CHLORHEXIDINE GLUCONATE 0.12 % MT SOLN: 15.0000 mL | Freq: Once | OROMUCOSAL | Status: AC

## 2024-01-01 MED ORDER — PHENYLEPHRINE 80 MCG/ML (10ML) SYRINGE FOR IV PUSH (FOR BLOOD PRESSURE SUPPORT)
PREFILLED_SYRINGE | INTRAVENOUS | Status: DC | PRN
  Administered 2024-01-01 (×4): 80 ug via INTRAVENOUS

## 2024-01-01 SURGICAL SUPPLY — 46 items
BAG COUNTER SPONGE SURGICOUNT (BAG) ×1 IMPLANT
BIT DRILL 7/64X5 DISP (BIT) ×1 IMPLANT
BLADE SAW SGTL 18X1.27X75 (BLADE) ×1 IMPLANT
BNDG COHESIVE 6X5 TAN ST LF (GAUZE/BANDAGES/DRESSINGS) ×1 IMPLANT
CHLORAPREP W/TINT 26 (MISCELLANEOUS) ×1 IMPLANT
COVER PERINEAL POST (MISCELLANEOUS) ×1 IMPLANT
COVER SURGICAL LIGHT HANDLE (MISCELLANEOUS) ×1 IMPLANT
DERMABOND ADVANCED .7 DNX12 (GAUZE/BANDAGES/DRESSINGS) ×1 IMPLANT
DRAPE C-ARM 42X72 X-RAY (DRAPES) ×1 IMPLANT
DRAPE IMP U-DRAPE 54X76 (DRAPES) ×1 IMPLANT
DRAPE STERI IOBAN 125X83 (DRAPES) ×1 IMPLANT
DRAPE U-SHAPE 47X51 STRL (DRAPES) IMPLANT
DRESSING AQUACEL AG SP 3.5X6 (GAUZE/BANDAGES/DRESSINGS) ×1 IMPLANT
DRSG AQUACEL AG ADV 3.5X10 (GAUZE/BANDAGES/DRESSINGS) IMPLANT
ELECT BLADE 6.5 EXT (BLADE) IMPLANT
ELECT CAUTERY BLADE 6.4 (BLADE) IMPLANT
ELECTRODE BLDE 4.0 EZ CLN MEGD (MISCELLANEOUS) IMPLANT
ELECTRODE REM PT RTRN 9FT ADLT (ELECTROSURGICAL) ×1 IMPLANT
GLOVE BIO SURGEON STRL SZ 6.5 (GLOVE) ×3 IMPLANT
GLOVE BIO SURGEON STRL SZ7.5 (GLOVE) ×3 IMPLANT
GLOVE BIOGEL PI IND STRL 6.5 (GLOVE) ×1 IMPLANT
GLOVE BIOGEL PI IND STRL 7.5 (GLOVE) ×1 IMPLANT
GOWN STRL REUS W/ TWL LRG LVL3 (GOWN DISPOSABLE) ×2 IMPLANT
HEAD FEM -3XOFST 36XMDLR (Head) IMPLANT
HOOD PEEL AWAY T7 (MISCELLANEOUS) ×3 IMPLANT
KIT BASIN OR (CUSTOM PROCEDURE TRAY) ×1 IMPLANT
KIT TURNOVER KIT B (KITS) ×1 IMPLANT
LINER ACETAB ~~LOC~~ D 36 (Liner) IMPLANT
MANIFOLD NEPTUNE II (INSTRUMENTS) ×1 IMPLANT
PACK TOTAL JOINT (CUSTOM PROCEDURE TRAY) ×1 IMPLANT
PACK UNIVERSAL I (CUSTOM PROCEDURE TRAY) ×1 IMPLANT
PAD ARMBOARD POSITIONER FOAM (MISCELLANEOUS) ×2 IMPLANT
PENCIL BUTTON HOLSTER BLD 10FT (ELECTRODE) IMPLANT
SET INTERPULSE LAVAGE W/TIP (ORTHOPEDIC DISPOSABLE SUPPLIES) ×1 IMPLANT
SHELL ACET G7 3H 50 SZD (Shell) IMPLANT
SOLN 0.9% NACL 1000 ML (IV SOLUTION) ×1 IMPLANT
SOLN 0.9% NACL POUR BTL 1000ML (IV SOLUTION) ×1 IMPLANT
SOLN STERILE WATER 1000 ML (IV SOLUTION) ×1 IMPLANT
SOLN STERILE WATER BTL 1000 ML (IV SOLUTION) ×1 IMPLANT
STAPLER SKIN PROX 35W (STAPLE) IMPLANT
STEM FEM CMTLS CO 7X134 133D (Stem) IMPLANT
SUT ETHIBOND NAB CT1 #1 30IN (SUTURE) ×1 IMPLANT
SUT MNCRL AB 3-0 PS2 18 (SUTURE) IMPLANT
SUT MON AB 2-0 SH 27 (SUTURE) ×1 IMPLANT
TOWEL GREEN STERILE (TOWEL DISPOSABLE) ×1 IMPLANT
TUBE SUCT ARGYLE STRL (TUBING) ×1 IMPLANT

## 2024-01-01 NOTE — Progress Notes (Signed)
 PROGRESS NOTE  Donna Becker FMW:[id number] DOB: 1946/02/26 DOA: 12/31/2023 PCP: Rexanne Ingle, MD   LOS: 1 day   Brief Narrative / Interim history: 78 year old female with HTN, HLD, DM2, seizure disorder, cerebral palsy with right-sided weakness, moderate AS who comes into the hospital after a fall and right hip pain.  She denies any loss of consciousness.  Imaging in the ED showed right femoral neck fracture, orthopedic surgery was consulted and she was admitted to the hospital  Subjective / 24h Interval events: She denies any significant pain at rest.  No chest discomfort, no shortness of breath.  No abdominal pain, no nausea or vomiting.  Mildly confused on my evaluation, tells me she is 77 years old and lives with her parents  Assesement and Plan: Principal problem Displaced right femoral neck fracture - Patient presented to the hospital with right femoral neck fracture status post fall.  Orthopedic surgery consulted, appreciate input, plan for operative repair today.  PT eval to follow  Active problems CHF exacerbation - Pt with mild CHF exacerbation, slightly elevated BNP, received Lasix  x 1 on admission.  She is comfortable on room air, hold further diuresis perioperatively - Echo has been ordered, pending   AKI versus CKD - Patient with creatinine elevation which may be secondary to an AKI versus CKD.  It is a difficult to differentiate as patient's last creatinine is over 1 year ago. - Creatinine is stable this morning following Lasix , monitor perioperatively  Hypertension -continue hydralazine , Coreg , nifedipine , blood pressure still on the high side probably due to pain.  Will monitor postoperatively  Hyperlipidemia - Restart home statin  Type 2 diabetes - Repeat A1c.  Placed on SSI.  Lymphedema - left sided lymphedema.  Will get left upper extremity Doppler given concern for DVT.  Gout - Holding home allupurinol given AKI. Re-dose if needed based on creatinine if pt  has CKD.   Scheduled Meds:  carbamazepine   200 mg Oral TID   carvedilol   25 mg Oral BID WC   cycloSPORINE   1 drop Both Eyes BID   hydrALAZINE   100 mg Oral BID   insulin aspart  0-6 Units Subcutaneous TID WC   Lifitegrast   1 drop Both Eyes BID   NIFEdipine   90 mg Oral Daily   pravastatin   20 mg Oral Daily   Continuous Infusions: PRN Meds:.acetaminophen **OR** acetaminophen, HYDROmorphone (DILAUDID) injection, HYDROmorphone, senna-docusate  Current Outpatient Medications  Medication Instructions   allopurinol  (ZYLOPRIM ) 100 MG tablet Take 1 tablet (100 mg total) by mouth in the morning for gout.   B Complex-C (B-COMPLEX WITH VITAMIN C) tablet 1 tablet, Daily   bimatoprost (LUMIGAN) 0.01 % SOLN 1 drop, Daily at bedtime   bisacodyl  (DULCOLAX) 5 MG EC tablet Take by mouth as directed per md.   carbamazepine  (TEGRETOL ) 200 MG tablet Take 1 tablet (200 mg total) by mouth 3 (three) times daily for seizure.   carvedilol  (COREG ) 25 MG tablet TAKE ONE TABLET BY MOUTH TWICE DAILY Needs appointment for further refills   carvedilol  (COREG ) 25 MG tablet Take 1 tablet (25 mg total) by mouth 2 (two) times daily with food.   ciprofloxacin  (CIPRO ) 250 mg, Oral, Every 12 hours   colchicine 0.6 mg, Daily   cycloSPORINE  (RESTASIS ) 0.05 % ophthalmic emulsion 1 drop, Both Eyes, 2 times daily   furosemide  (LASIX ) 20 mg, Oral, Daily   gabapentin  (NEURONTIN ) 300 MG capsule Take 1 capsule (300 mg total) by mouth at night.   hydrALAZINE  (APRESOLINE ) 100 MG  tablet Take 1 tablet (100 mg total) by mouth 2 (two) times daily, once in the morning and once at noon.   hydrALAZINE  (APRESOLINE ) 100 mg, 2 times daily   Lifitegrast  (XIIDRA ) 5 % SOLN Instill 1 drop into both eyes twice a day   losartan (COZAAR) 100 mg, Every evening   Multiple Vitamin (MULTIVITAMIN WITH MINERALS) TABS tablet 1 tablet, Daily   NIFEdipine  (ADALAT  CC) 90 MG 24 hr tablet Take 1 tablet (90 mg total) by mouth daily on an empty stomach.    NIFEdipine  (ADALAT  CC) 90 mg, Oral, Daily   polyethylene glycol (GOLYTELY) 236 g solution Take as directed per doctor instructions.   pravastatin  (PRAVACHOL ) 20 mg, Oral, Daily   Probiotic Product (PROBIOTIC DAILY) CAPS 1 capsule, Daily    Diet Orders (From admission, onward)     Start     Ordered   01/01/24 0914  Diet NPO time specified Except for: Sips with Meds, Ice Chips  Diet effective now       Question Answer Comment  Except for Sips with Meds   Except for Ice Chips      01/01/24 0914            DVT prophylaxis: SCDs Start: 12/31/23 2005   Lab Results  Component Value Date   PLT 298 01/01/2024      Code Status: Full Code  Family Communication: No family at bedside  Status is: Inpatient Remains inpatient appropriate because: Severity of illness   Level of care: Telemetry Cardiac  Consultants:  Orthopedic surgery  Objective: Vitals:   01/01/24 0507 01/01/24 0717 01/01/24 0721 01/01/24 0919  BP:   (!) 181/78 (!) 162/65  Pulse:  72 71   Resp:  15 15   Temp: 98.6 F (37 C) 98.3 F (36.8 C)    TempSrc: Oral Oral    SpO2:  95% 93%   Height:       No intake or output data in the 24 hours ending 01/01/24 0936 Wt Readings from Last 3 Encounters:  06/02/23 59 kg  04/22/23 60 kg  11/28/22 60.4 kg    Examination:  Constitutional: NAD Eyes: no scleral icterus ENMT: Mucous membranes are moist.  Neck: normal, supple Respiratory: clear to auscultation bilaterally, no wheezing, no crackles. Normal respiratory effort.  Cardiovascular: Regular rate and rhythm, no murmurs / rubs / gallops. No LE edema.  Abdomen: non distended, no tenderness. Bowel sounds positive.  Musculoskeletal: no clubbing / cyanosis.   Data Reviewed: I have independently reviewed following labs and imaging studies   CBC Recent Labs  Lab 12/31/23 1731 01/01/24 0615  WBC 8.4 6.1  HGB 11.2* 11.9*  HCT 34.7* 36.2  PLT 283 298  MCV 96.1 94.0  MCH 31.0 30.9  MCHC 32.3 32.9  RDW  14.2 14.3  LYMPHSABS 1.2  --   MONOABS 0.3  --   EOSABS 0.1  --   BASOSABS 0.0  --     Recent Labs  Lab 12/31/23 1731 12/31/23 1926 12/31/23 2220 01/01/24 0615  NA 137  --   --  139  K 4.5  --   --  4.2  CL 101  --   --  100  CO2 21*  --   --  23  GLUCOSE 143*  --   --  118*  BUN 73*  --   --  71*  CREATININE 1.53*  --   --  1.64*  CALCIUM 9.3  --   --  9.7  AST 39  --   --   --   ALT 33  --   --   --   ALKPHOS 133*  --   --   --   BILITOT 0.5  --   --   --   ALBUMIN 3.9  --   --   --   MG  --   --  2.2  --   BNP  --  477.2*  --   --     ------------------------------------------------------------------------------------------------------------------ No results for input(s): CHOL, HDL, LDLCALC, TRIG, CHOLHDL, LDLDIRECT in the last 72 hours.  Lab Results  Component Value Date   HGBA1C 6.5 (H) 12/28/2010   ------------------------------------------------------------------------------------------------------------------ No results for input(s): TSH, T4TOTAL, T3FREE, THYROIDAB in the last 72 hours.  Invalid input(s): FREET3  Cardiac Enzymes No results for input(s): CKMB, TROPONINI, MYOGLOBIN in the last 168 hours.  Invalid input(s): CK ------------------------------------------------------------------------------------------------------------------    Component Value Date/Time   BNP 477.2 (H) 12/31/2023 1926    CBG: Recent Labs  Lab 01/01/24 0802  GLUCAP 104*    No results found for this or any previous visit (from the past 240 hours).   Radiology Studies: DG Knee Right Port Result Date: 12/31/2023 CLINICAL DATA:  Fall, right knee pain EXAM: PORTABLE RIGHT KNEE - 1-2 VIEW COMPARISON:  None Available. FINDINGS: The examination is limited by suboptimal positioning and limited obliquity between images. No definite acute fracture or dislocation. Mild patellofemoral degenerative arthritis. No effusion. Soft tissues are unremarkable.  IMPRESSION: 1. Limited examination. No definite acute fracture or dislocation. Electronically Signed   By: Dorethia Molt M.D.   On: 12/31/2023 21:40   DG Chest 1 View Result Date: 12/31/2023 CLINICAL DATA:  Fall with right hip fracture. EXAM: CHEST  1 VIEW COMPARISON:  11/22/2022 FINDINGS: Cardiomegaly is stable. Unchanged mediastinal contours, aortic atherosclerosis. Unchanged vascular congestion. Right infrahilar atelectasis. No confluent consolidation. No pneumothorax or pleural effusion. Left axillary surgical clips. The bones are under mineralized. IMPRESSION: Cardiomegaly and vascular congestion. Right infrahilar atelectasis. Electronically Signed   By: Andrea Gasman M.D.   On: 12/31/2023 17:15   DG Hip Unilat W or Wo Pelvis 2-3 Views Right Result Date: 12/31/2023 CLINICAL DATA:  Status post fall with suspected hip fracture. EXAM: DG HIP (WITH OR WITHOUT PELVIS) 2-3V RIGHT COMPARISON:  None Available. FINDINGS: Technically limited due to positioning. Displaced right femoral neck fracture. Approximately 3 cm proximal migration of the femoral shaft. Femoral head is well seated. Bony pelvis including pubic rami are grossly intact. The bones are subjectively under mineralized. IMPRESSION: Displaced right femoral neck fracture. Electronically Signed   By: Andrea Gasman M.D.   On: 12/31/2023 17:14     Nilda Fendt, MD, PhD [facility]  Between 7 am - 7 pm I am available, please contact me via Amion (for emergencies) or Securechat (non urgent messages)  Between 7 pm - 7 am I am not available, please contact night coverage MD/APP via Amion

## 2024-01-01 NOTE — Interval H&P Note (Signed)
 History and Physical Interval Note:  01/01/2024 1:27 PM  Donna Becker  has presented today for surgery, with the diagnosis of Right femoral neck fracture.  The various methods of treatment have been discussed with the patient and family. After consideration of risks, benefits and other options for treatment, the patient has consented to  Procedure(s): ARTHROPLASTY, HIP, TOTAL, ANTERIOR APPROACH (Right) as a surgical intervention.  The patient's history has been reviewed, patient examined, no change in status, stable for surgery.  I have reviewed the patient's chart and labs.  Questions were answered to the patient's satisfaction.     Betzabeth Derringer P Tiler Brandis

## 2024-01-01 NOTE — Progress Notes (Signed)
 Transition of Care [facility]) - CAGE-AID Screening   Patient Details  Name: Donna Becker MRN: [id number] Date of Birth: 01-11-1946  Transition of Care [facility]) CM/SW Contact:    Sallyanne MALVA Mettle, RN Phone Number: 01/01/2024, 9:51 PM   Clinical Narrative:  Confused and unable to participate in screening.  CAGE-AID Screening: Substance Abuse Screening unable to be completed due to: : Patient unable to participate (confused)

## 2024-01-01 NOTE — Anesthesia Procedure Notes (Signed)
 Procedure Name: Intubation Date/Time: 01/01/2024 2:05 PM  Performed by: Cindie Donald CROME, CRNAPre-anesthesia Checklist: Patient identified, Emergency Drugs available, Suction available and Patient being monitored Patient Re-evaluated:Patient Re-evaluated prior to induction Oxygen Delivery Method: Circle System Utilized Preoxygenation: Pre-oxygenation with 100% oxygen Induction Type: IV induction Ventilation: Mask ventilation without difficulty Laryngoscope Size: Mac and 3 Grade View: Grade I Tube type: Oral Tube size: 7.0 mm Number of attempts: 1 Airway Equipment and Method: Stylet Placement Confirmation: ETT inserted through vocal cords under direct vision, positive ETCO2 and breath sounds checked- equal and bilateral Secured at: 19 cm Tube secured with: Tape Dental Injury: Teeth and Oropharynx as per pre-operative assessment

## 2024-01-01 NOTE — Consult Note (Signed)
 Reason for Consult:Right hip fx Referring Physician: Nilda Becker Time called: 0730 Time at bedside: 0902   Donna Becker is an 78 y.o. female.  HPI: Donna Becker got up to go put some clothes away, lost her balance, and fell. She had immediate right hip pain and could not get up. She was brought to the ED where x-rays showed a hip fx and orthopedic surgery was consulted. She lives with her brother and uses a RW to ambulate at baseline 2/2 CP.  Past Medical History:  Diagnosis Date   Arthritis    Cancer (HCC)    left breast cancer /with retained lymph edema left arm   Cerebral palsy, hemiplegic (HCC)    right sided weakness- ambulates with walker   Diabetes mellitus without complication (HCC)    Glaucoma    Gout    History of shingles    Hypertension    Neuromuscular disorder (HCC)    neuropathy, right sided paresis, left arm numbness   Seizure (HCC)    Seizures (HCC)    Transfusion history    '90's when had breast cancer surgery    Past Surgical History:  Procedure Laterality Date   BIOPSY  09/29/2019   Procedure: BIOPSY;  Surgeon: Donna Specking, MD;  Location: [facility];  Service: Endoscopy;;   BIOPSY  04/22/2023   Procedure: BIOPSY;  Surgeon: Donna Specking, MD;  Location: [facility];  Service: Gastroenterology;;   BREAST SURGERY Left    s/p left mastectomy   CHOLECYSTECTOMY     open    COLONOSCOPY WITH PROPOFOL  N/A 09/29/2019   Procedure: COLONOSCOPY WITH PROPOFOL ;  Surgeon: Donna Specking, MD;  Location: [facility];  Service: Endoscopy;  Laterality: N/A;   COLONOSCOPY WITH PROPOFOL  N/A 04/22/2023   Procedure: COLONOSCOPY WITH PROPOFOL ;  Surgeon: Donna Specking, MD;  Location: [facility];  Service: Gastroenterology;  Laterality: N/A;   ESOPHAGOGASTRODUODENOSCOPY (EGD) WITH PROPOFOL  N/A 09/29/2019   Procedure: ESOPHAGOGASTRODUODENOSCOPY (EGD) WITH PROPOFOL ;  Surgeon: Donna Specking, MD;  Location: [facility];  Service: Endoscopy;  Laterality: N/A;    EYE SURGERY Right    corneal transplant   FLEXIBLE SIGMOIDOSCOPY N/A 01/26/2013   Procedure: FLEXIBLE SIGMOIDOSCOPY;  Surgeon: Donna MARLA Louder, MD;  Location: [facility];  Service: Endoscopy;  Laterality: N/A;  no sedation   POLYPECTOMY  09/29/2019   Procedure: POLYPECTOMY;  Surgeon: Donna Specking, MD;  Location: [facility];  Service: Endoscopy;;   POLYPECTOMY  04/22/2023   Procedure: POLYPECTOMY;  Surgeon: Donna Specking, MD;  Location: [facility];  Service: Gastroenterology;;   port-a-cath     implantation and removal   TUBAL LIGATION      Family History  Problem Relation Age of Onset   Hypertension Mother    Arthritis Sister     Social History:  reports that she quit smoking about 55 years ago. Her smoking use included cigarettes. She started smoking about 56 years ago. She has never used smokeless tobacco. She reports that she does not drink alcohol and does not use drugs.  Allergies:  Allergies  Allergen Reactions   Codeine Itching and Rash    Medications: I have reviewed the patient's current medications.  Results for orders placed or performed during the hospital encounter of 12/31/23 (from the past 48 hours)  CBC with Differential     Status: Abnormal   Collection Time: 12/31/23  5:31 PM  Result Value Ref Range   WBC 8.4 4.0 - 10.5 K/uL   RBC 3.61 (L) 3.87 - 5.11 MIL/uL   Hemoglobin  11.2 (L) 12.0 - 15.0 g/dL   HCT 65.2 (L) 63.9 - 53.9 %   MCV 96.1 80.0 - 100.0 fL   MCH 31.0 26.0 - 34.0 pg   MCHC 32.3 30.0 - 36.0 g/dL   RDW 85.7 88.4 - 84.4 %   Platelets 283 150 - 400 K/uL   nRBC 0.0 0.0 - 0.2 %   Neutrophils Relative % 80 %   Neutro Abs 6.8 1.7 - 7.7 K/uL   Lymphocytes Relative 15 %   Lymphs Abs 1.2 0.7 - 4.0 K/uL   Monocytes Relative 4 %   Monocytes Absolute 0.3 0.1 - 1.0 K/uL   Eosinophils Relative 1 %   Eosinophils Absolute 0.1 0.0 - 0.5 K/uL   Basophils Relative 0 %   Basophils Absolute 0.0 0.0 - 0.1 K/uL   Immature Granulocytes 0 %   Abs  Immature Granulocytes 0.03 0.00 - 0.07 K/uL    Comment: Performed at [facility], [address] [place], [place] [address]  Comprehensive metabolic panel     Status: Abnormal   Collection Time: 12/31/23  5:31 PM  Result Value Ref Range   Sodium 137 135 - 145 mmol/L   Potassium 4.5 3.5 - 5.1 mmol/L   Chloride 101 98 - 111 mmol/L   CO2 21 (L) 22 - 32 mmol/L   Glucose, Bld 143 (H) 70 - 99 mg/dL    Comment: Glucose reference range applies only to samples taken after fasting for at least 8 hours.   BUN 73 (H) 8 - 23 mg/dL   Creatinine, Ser 8.46 (H) 0.44 - 1.00 mg/dL   Calcium 9.3 8.9 - 89.6 mg/dL   Total Protein 8.7 (H) 6.5 - 8.1 g/dL   Albumin 3.9 3.5 - 5.0 g/dL   AST 39 15 - 41 U/L   ALT 33 0 - 44 U/L   Alkaline Phosphatase 133 (H) 38 - 126 U/L   Total Bilirubin 0.5 0.0 - 1.2 mg/dL   GFR, Estimated 34 (L) >60 mL/min    Comment: (NOTE) Calculated using the CKD-EPI Creatinine Equation (2021)    Anion gap 15 5 - 15    Comment: Performed at [facility], [address] [place], [place] [address]  Troponin I (High Sensitivity)     Status: Abnormal   Collection Time: 12/31/23  7:26 PM  Result Value Ref Range   Troponin I (High Sensitivity) 25 (H) <18 ng/L    Comment: (NOTE) Elevated high sensitivity troponin I (hsTnI) values and significant  changes across serial measurements may suggest ACS but many other  chronic and acute conditions are known to elevate hsTnI results.  Refer to the Links section for chest pain algorithms and additional  guidance. Performed at [facility], [address], [place], [place] [address]   Brain natriuretic peptide     Status: Abnormal   Collection Time: 12/31/23  7:26 PM  Result Value Ref Range   B Natriuretic Peptide 477.2 (H) 0.0 - 100.0 pg/mL    Comment: Performed at [facility], [address] [place], [place] [address]  Magnesium     Status: None   Collection Time: 12/31/23 10:20 PM  Result Value Ref Range    Magnesium 2.2 1.7 - 2.4 mg/dL    Comment: Performed at [facility], [address], [place], [place] [address]  Troponin I (High Sensitivity)     Status: Abnormal   Collection Time: 12/31/23 10:20 PM  Result Value  Ref Range   Troponin I (High Sensitivity) 24 (H) <18 ng/L    Comment: (NOTE) Elevated high sensitivity troponin I (hsTnI) values and significant  changes across serial measurements may suggest ACS but many other  chronic and acute conditions are known to elevate hsTnI results.  Refer to the Links section for chest pain algorithms and additional  guidance. Performed at [facility], [address] [place], [place] [address]   Basic metabolic panel     Status: Abnormal   Collection Time: 01/01/24  6:15 AM  Result Value Ref Range   Sodium 139 135 - 145 mmol/L   Potassium 4.2 3.5 - 5.1 mmol/L   Chloride 100 98 - 111 mmol/L   CO2 23 22 - 32 mmol/L   Glucose, Bld 118 (H) 70 - 99 mg/dL    Comment: Glucose reference range applies only to samples taken after fasting for at least 8 hours.   BUN 71 (H) 8 - 23 mg/dL   Creatinine, Ser 8.35 (H) 0.44 - 1.00 mg/dL   Calcium 9.7 8.9 - 89.6 mg/dL   GFR, Estimated 32 (L) >60 mL/min    Comment: (NOTE) Calculated using the CKD-EPI Creatinine Equation (2021)    Anion gap 16 (H) 5 - 15    Comment: Performed at [facility], [address], [place], [place] [address]  CBC     Status: Abnormal   Collection Time: 01/01/24  6:15 AM  Result Value Ref Range   WBC 6.1 4.0 - 10.5 K/uL   RBC 3.85 (L) 3.87 - 5.11 MIL/uL   Hemoglobin 11.9 (L) 12.0 - 15.0 g/dL   HCT 63.7 63.9 - 53.9 %   MCV 94.0 80.0 - 100.0 fL   MCH 30.9 26.0 - 34.0 pg   MCHC 32.9 30.0 - 36.0 g/dL   RDW 85.6 88.4 - 84.4 %   Platelets 298 150 - 400 K/uL   nRBC 0.0 0.0 - 0.2 %    Comment: Performed at [facility], [address], [place], [place] [address]  ABO/Rh     Status: None   Collection Time: 01/01/24  6:15 AM  Result Value Ref Range    ABO/RH(D)      A POS Performed at [facility], [address], [place], [place] [address]   CBG monitoring, ED     Status: Abnormal   Collection Time: 01/01/24  8:02 AM  Result Value Ref Range   Glucose-Capillary 104 (H) 70 - 99 mg/dL    Comment: Glucose reference range applies only to samples taken after fasting for at least 8 hours.    DG Knee Right Port Result Date: 12/31/2023 CLINICAL DATA:  Fall, right knee pain EXAM: PORTABLE RIGHT KNEE - 1-2 VIEW COMPARISON:  None Available. FINDINGS: The examination is limited by suboptimal positioning and limited obliquity between images. No definite acute fracture or dislocation. Mild patellofemoral degenerative arthritis. No effusion. Soft tissues are unremarkable. IMPRESSION: 1. Limited examination. No definite acute fracture or dislocation. Electronically Signed   By: Dorethia Molt M.D.   On: 12/31/2023 21:40   DG Chest 1 View Result Date: 12/31/2023 CLINICAL DATA:  Fall with right hip fracture. EXAM: CHEST  1 VIEW COMPARISON:  11/22/2022 FINDINGS: Cardiomegaly is stable. Unchanged mediastinal contours, aortic atherosclerosis. Unchanged vascular congestion. Right infrahilar atelectasis. No confluent consolidation. No pneumothorax or pleural effusion. Left axillary surgical clips. The bones are under mineralized. IMPRESSION: Cardiomegaly and vascular congestion. Right infrahilar atelectasis. Electronically Signed  By: Andrea Gasman M.D.   On: 12/31/2023 17:15   DG Hip Unilat W or Wo Pelvis 2-3 Views Right Result Date: 12/31/2023 CLINICAL DATA:  Status post fall with suspected hip fracture. EXAM: DG HIP (WITH OR WITHOUT PELVIS) 2-3V RIGHT COMPARISON:  None Available. FINDINGS: Technically limited due to positioning. Displaced right femoral neck fracture. Approximately 3 cm proximal migration of the femoral shaft. Femoral head is well seated. Bony pelvis including pubic rami are grossly intact. The bones are subjectively under mineralized.  IMPRESSION: Displaced right femoral neck fracture. Electronically Signed   By: Andrea Gasman M.D.   On: 12/31/2023 17:14    Review of Systems  HENT:  Negative for ear discharge, ear pain, hearing loss and tinnitus.   Eyes:  Negative for photophobia and pain.  Respiratory:  Negative for cough and shortness of breath.   Cardiovascular:  Negative for chest pain.  Gastrointestinal:  Negative for abdominal pain, nausea and vomiting.  Genitourinary:  Negative for dysuria, flank pain, frequency and urgency.  Musculoskeletal:  Positive for arthralgias (Right hip). Negative for back pain, myalgias and neck pain.  Neurological:  Negative for dizziness and headaches.  Hematological:  Does not bruise/bleed easily.  Psychiatric/Behavioral:  The patient is not nervous/anxious.    Blood pressure (!) 181/78, pulse 71, temperature 98.3 F (36.8 C), temperature source Oral, resp. rate 15, height 5' 5 (1.651 m), SpO2 93%. Physical Exam Constitutional:      General: She is not in acute distress.    Appearance: She is well-developed. She is not diaphoretic.  HENT:     Head: Normocephalic and atraumatic.  Eyes:     General: No scleral icterus.       Right eye: No discharge.        Left eye: No discharge.     Conjunctiva/sclera: Conjunctivae normal.  Cardiovascular:     Rate and Rhythm: Normal rate and regular rhythm.  Pulmonary:     Effort: Pulmonary effort is normal. No respiratory distress.  Musculoskeletal:     Cervical back: Normal range of motion.     Comments: RLE No traumatic wounds, ecchymosis, or rash  Mild TTP hip  No knee or ankle effusion  Knee stable to varus/ valgus and anterior/posterior stress  Sens DPN, SPN, TN intact  Motor EHL, ext, flex, evers 5/5  DP 2+, PT 2+, No significant edema  Skin:    General: Skin is warm and dry.  Neurological:     Mental Status: She is alert.  Psychiatric:        Mood and Affect: Mood normal.        Behavior: Behavior normal.      Assessment/Plan: Right hip fx -- Plan THA today with Dr. Kendal. Please keep NPO. Multiple medical problems including CP w/right hemiplegia, hypertension, hyperlipidemia, type 2 diabetes, HFpEF and moderate aortic stenosis -- per primary service    Ozell DOROTHA Ned, PA-C Orthopedic Surgery [phone] 01/01/2024, 9:08 AM

## 2024-01-01 NOTE — Anesthesia Preprocedure Evaluation (Addendum)
 Anesthesia Evaluation  Patient identified by MRN, date of birth, ID band Patient awake    Reviewed: Allergy & Precautions, H&P , NPO status , Patient's Chart, lab work & pertinent test results  History of Anesthesia Complications Negative for: history of anesthetic complications  Airway Mallampati: II  TM Distance: >3 FB Neck ROM: Full    Dental no notable dental hx.    Pulmonary former smoker   Pulmonary exam normal breath sounds clear to auscultation       Cardiovascular hypertension, (-) angina (-) Past MI Normal cardiovascular exam+ Valvular Problems/Murmurs AS  Rhythm:Regular Rate:Normal  IMPRESSIONS     1. Left ventricular ejection fraction, by estimation, is 60 to 65%. The  left ventricle has normal function. The left ventricle has no regional  wall motion abnormalities. There is moderate concentric left ventricular  hypertrophy. Left ventricular  diastolic parameters are consistent with Grade II diastolic dysfunction  (pseudonormalization). Elevated left ventricular end-diastolic pressure.  The average left ventricular global longitudinal strain is -20.9 %. The  global longitudinal strain is normal.   2. Right ventricular systolic function is normal. The right ventricular  size is normal. There is normal pulmonary artery systolic pressure. The  estimated right ventricular systolic pressure is 33.9 mmHg.   3. Left atrial size was severely dilated.   4. The mitral valve is degenerative. Mild mitral valve regurgitation. No  evidence of mitral stenosis. Severe mitral annular calcification.   5. Tricuspid valve regurgitation is mild to moderate.   6. The aortic valve is calcified. There is severe calcifcation of the  aortic valve. There is severe thickening of the aortic valve. Aortic valve  regurgitation is not visualized. Mild to moderate aortic valve stenosis.  Aortic valve area, by VTI measures  1.07 cm. Aortic valve  mean gradient measures 18.0 mmHg. Aortic valve Vmax  measures 2.68 m/s.   7. The inferior vena cava is normal in size with greater than 50%  respiratory variability, suggesting right atrial pressure of 3 mmHg.     Neuro/Psych Seizures -, Well Controlled,  Cerebral palsy, hemiplegic  negative psych ROS   GI/Hepatic negative GI ROS, Neg liver ROS,,,  Endo/Other  diabetes, Type 2    Renal/GU negative Renal ROS  negative genitourinary   Musculoskeletal  (+) Arthritis ,  Right hip fx   Abdominal   Peds negative pediatric ROS (+)  Hematology  (+) Blood dyscrasia, anemia   Anesthesia Other Findings   Reproductive/Obstetrics  left breast cancer                               Anesthesia Physical Anesthesia Plan  ASA: 3  Anesthesia Plan: General   Post-op Pain Management: Ofirmev IV (intra-op)*   Induction: Intravenous  PONV Risk Score and Plan: 3 and Ondansetron , Dexamethasone  and Treatment may vary due to age or medical condition  Airway Management Planned: Oral ETT  Additional Equipment:   Intra-op Plan:   Post-operative Plan: Extubation in OR  Informed Consent: I have reviewed the patients History and Physical, chart, labs and discussed the procedure including the risks, benefits and alternatives for the proposed anesthesia with the patient or authorized representative who has indicated his/her understanding and acceptance.     Dental advisory given  Plan Discussed with: CRNA  Anesthesia Plan Comments:          Anesthesia Quick Evaluation

## 2024-01-01 NOTE — Op Note (Signed)
 Orthopaedic Surgery Operative Note (CSN: [id number] ) Date of Surgery: 01/01/2024  Admit Date: 12/31/2023   Diagnoses: Pre-Op Diagnoses: Right displaced femoral neck fracture  Post-Op Diagnosis: Same  Procedures: CPT 27130-Right total hip arthroplasty for femoral neck fracture  Surgeons : Primary: Kendal Franky SQUIBB, MD  Assistant: Lauraine Moores, PA-C  Location: OR 3   Anesthesia:General   Antibiotics: Ancef 2g preop with 1 gm vancomycin powder placed topically   Tourniquet time: None    Estimated Blood Loss: 250 mL  Complications:* No complications entered in OR log *   Specimens:* No specimens in log *   Implants: Implant Name Type Inv. Item Serial No. Manufacturer Lot No. LRB No. Used Action  SHELL ACET G7 3H 50 SZD - [id number] Shell SHELL ACET G7 3H 50 SZD  [employer](ORTH,TRAU,BIO,SG) [id number] Right 1 Implanted  LINER ACETAB [place] D 36 - [id number] Liner LINER ACETAB [place] D 36  [employer](ORTH,TRAU,BIO,SG) [id number] Right 1 Implanted  STEM FEM CMTLS CO 7X134 133D - [id number] Stem STEM FEM CMTLS CO 7X134 133D  [employer]ON(ORTH,TRAU,BIO,SG) [id number] Right 1 Implanted  HEAD FEM -3XOFST 36XMDLR - [id number] Head HEAD FEM -3XOFST 36XMDLR  [employer]MER [employer]ON(ORTH,TRAU,BIO,SG) [id number] Right 1 Implanted     Indications for Surgery: 78 year old female who sustained a ground-level fall with a displaced right femoral neck fracture.  Due to her age and mobility level and independence I recommend proceeding with right total hip arthroplasty.  Risks and benefits were discussed with the patient and her sister.  Risks included but not limited to bleeding, infection, hip dislocation, leg length discrepancy, periprosthetic fracture, need for revision, nerve and blood vessel event injury including lateral femoral cutaneous nerve, DVT, even the possibility anesthetic complications.  They agreed to proceed with surgery and consent was obtained.  Operative Findings: 1.  Right total hip arthroplasty  for femoral neck fracture through anterior incision using [employer] G7 acetabular 50 mm shell size D 2.  [employer] G7 acetabular system highly cross-linked vitamin E polyethylene for a 36 mm head, neutral offset 3.  Cementless [employer] Taperloc reduced distal size 7 high offset stem 4.  [employer] 36 mm cobalt chrome femoral head with a -3 mm neck  Procedure: The patient was identified in the preoperative holding area. Consent was confirmed with the patient and their family and all questions were answered. The operative extremity was marked after confirmation with the patient. she was then brought back to the operating room by our anesthesia colleagues.  She was placed under general anesthetic and carefully transferred over to a Hana table.  Fluoroscopic imaging showed the unstable nature of her injury.  The right hip was prepped and draped in usual sterile fashion.  A timeout was performed to verify the patient, the procedure, and the extremity.  Preoperative antibiotics were dosed.  A standard anterior approach to the hip was made and carried down through skin and subcutaneous tissue.  I incised through the lateral fascia of the TFL.  I then swept of the fascia out of the way and entered the interval between the TFL and sartorius.  Identified the crossing vessels and cauterized these.  I then exposed the anterior capsule and performed a capsulotomy.  I released the capsule all the way down to the lesser trochanter.  I then performed a intercalary femoral neck cut and remove the intercalary fragment.  I then used a corkscrew to remove the femoral head.  I measured the head size and it was a 44 mm head.  I then exposed the acetabulum and removed the acetabulum labrum as well as impinging soft tissue.  I then sequentially reamed from 43 mm up to 49 mm.  I reamed under fluoroscopy to make sure I had adequate abduction and anteversion.  I then proceeded to press-fit a 50 mm acetabular shell.  I  got excellent fixation and did not need to supplement it with another screw.  I then used a highly cross-linked polyethylene liner for a 36 mm head.  I felt that the stability from the 36 mm head would outweigh the risks of polyethylene wear.  I then turned my attention to the femur.  Externally rotated the femur to 125 degrees.  I delivered the femur to the wound.  I performed a superior capsular release to deliver the femur appropriately for broaching.  I then sequentially broached from size 4 up to a size 7.  I got excellent rotational stability and I proceeded to trial off this using a high offset stem with a -3 neck.  Got excellent stability after reducing this and the leg lengths were nearly symmetric.  I redislocated the hip remove the trial implants.  I then placed the high offset size 7 stem and got excellent stability.  I then placed the 36 mm head and reduced the hip.  Final fluoroscopic imaging was obtained.  The incision was irrigated.  A gram vancomycin powder was placed into the incision.  A layered closure was #1 Ethibond for the capsule, 0 Vicryl for the fascia and 2-0 Monocryl and 3-0 Monocryl with Dermabond for the skin.  Sterile dressing was applied.  The patient was then awoke from anesthesia and taken to the PACU in stable condition.  Post Op Plan/Instructions: The patient will be weightbearing as tolerated to the right lower extremity.  She will receive postoperative Ancef.  No hip precautions are needed.  Patient will mobilize with physical and Occupational Therapy.  She be placed on Lovenox for DVT prophylaxis while inpatient and discharged on aspirin.  I was present and performed the entire surgery.  Lauraine Moores, PA-C did assist me throughout the case. An assistant was necessary given the difficulty in approach, maintenance of reduction and ability to instrument the fracture.   Franky Light, MD [facility]

## 2024-01-01 NOTE — Transfer of Care (Signed)
 Immediate Anesthesia Transfer of Care Note  Patient: Donna Becker  Procedure(s) Performed: ARTHROPLASTY, HIP, TOTAL, ANTERIOR APPROACH, RIGHT (Right: Hip)  Patient Location: PACU  Anesthesia Type:General  Level of Consciousness: awake, pateint uncooperative, and confused  Airway & Oxygen Therapy: Patient Spontanous Breathing and Patient connected to nasal cannula oxygen  Post-op Assessment: Report given to RN and Post -op Vital signs reviewed and stable  Post vital signs: Reviewed and stable  Last Vitals:  Vitals Value Taken Time  BP 159/70 01/01/24 16:00  Temp    Pulse 71 01/01/24 16:06  Resp 16 01/01/24 16:06  SpO2 97 % 01/01/24 16:06  Vitals shown include unfiled device data.  Last Pain:  Vitals:   01/01/24 1122  TempSrc:   PainSc: 5          Complications: No notable events documented.

## 2024-01-01 NOTE — ED Notes (Signed)
Pt able to tolerate PO meds.  

## 2024-01-01 NOTE — Progress Notes (Signed)
 2D echo attempted, Discussed echo with Dr Treen and Dr Kendal. Hold echo and perform post surgical procedure.

## 2024-01-01 NOTE — H&P (View-Only) (Signed)
 Reason for Consult:Right hip fx Referring Physician: Nilda Fendt Time called: 0730 Time at bedside: 0902   Donna Becker is an 78 y.o. female.  HPI: Donna Becker got up to go put some clothes away, lost her balance, and fell. She had immediate right hip pain and could not get up. She was brought to the ED where x-rays showed a hip fx and orthopedic surgery was consulted. She lives with her brother and uses a RW to ambulate at baseline 2/2 CP.  Past Medical History:  Diagnosis Date   Arthritis    Cancer (HCC)    left breast cancer /with retained lymph edema left arm   Cerebral palsy, hemiplegic (HCC)    right sided weakness- ambulates with walker   Diabetes mellitus without complication (HCC)    Glaucoma    Gout    History of shingles    Hypertension    Neuromuscular disorder (HCC)    neuropathy, right sided paresis, left arm numbness   Seizure (HCC)    Seizures (HCC)    Transfusion history    '90's when had breast cancer surgery    Past Surgical History:  Procedure Laterality Date   BIOPSY  09/29/2019   Procedure: BIOPSY;  Surgeon: Dianna Specking, MD;  Location: [facility];  Service: Endoscopy;;   BIOPSY  04/22/2023   Procedure: BIOPSY;  Surgeon: Dianna Specking, MD;  Location: [facility];  Service: Gastroenterology;;   BREAST SURGERY Left    s/p left mastectomy   CHOLECYSTECTOMY     open    COLONOSCOPY WITH PROPOFOL  N/A 09/29/2019   Procedure: COLONOSCOPY WITH PROPOFOL ;  Surgeon: Dianna Specking, MD;  Location: [facility];  Service: Endoscopy;  Laterality: N/A;   COLONOSCOPY WITH PROPOFOL  N/A 04/22/2023   Procedure: COLONOSCOPY WITH PROPOFOL ;  Surgeon: Dianna Specking, MD;  Location: [facility];  Service: Gastroenterology;  Laterality: N/A;   ESOPHAGOGASTRODUODENOSCOPY (EGD) WITH PROPOFOL  N/A 09/29/2019   Procedure: ESOPHAGOGASTRODUODENOSCOPY (EGD) WITH PROPOFOL ;  Surgeon: Dianna Specking, MD;  Location: [facility];  Service: Endoscopy;  Laterality: N/A;    EYE SURGERY Right    corneal transplant   FLEXIBLE SIGMOIDOSCOPY N/A 01/26/2013   Procedure: FLEXIBLE SIGMOIDOSCOPY;  Surgeon: Gladis MARLA Louder, MD;  Location: [facility];  Service: Endoscopy;  Laterality: N/A;  no sedation   POLYPECTOMY  09/29/2019   Procedure: POLYPECTOMY;  Surgeon: Dianna Specking, MD;  Location: [facility];  Service: Endoscopy;;   POLYPECTOMY  04/22/2023   Procedure: POLYPECTOMY;  Surgeon: Dianna Specking, MD;  Location: [facility];  Service: Gastroenterology;;   port-a-cath     implantation and removal   TUBAL LIGATION      Family History  Problem Relation Age of Onset   Hypertension Mother    Arthritis Sister     Social History:  reports that she quit smoking about 55 years ago. Her smoking use included cigarettes. She started smoking about 56 years ago. She has never used smokeless tobacco. She reports that she does not drink alcohol and does not use drugs.  Allergies:  Allergies  Allergen Reactions   Codeine Itching and Rash    Medications: I have reviewed the patient's current medications.  Results for orders placed or performed during the hospital encounter of 12/31/23 (from the past 48 hours)  CBC with Differential     Status: Abnormal   Collection Time: 12/31/23  5:31 PM  Result Value Ref Range   WBC 8.4 4.0 - 10.5 K/uL   RBC 3.61 (L) 3.87 - 5.11 MIL/uL   Hemoglobin  11.2 (L) 12.0 - 15.0 g/dL   HCT 65.2 (L) 63.9 - 53.9 %   MCV 96.1 80.0 - 100.0 fL   MCH 31.0 26.0 - 34.0 pg   MCHC 32.3 30.0 - 36.0 g/dL   RDW 85.7 88.4 - 84.4 %   Platelets 283 150 - 400 K/uL   nRBC 0.0 0.0 - 0.2 %   Neutrophils Relative % 80 %   Neutro Abs 6.8 1.7 - 7.7 K/uL   Lymphocytes Relative 15 %   Lymphs Abs 1.2 0.7 - 4.0 K/uL   Monocytes Relative 4 %   Monocytes Absolute 0.3 0.1 - 1.0 K/uL   Eosinophils Relative 1 %   Eosinophils Absolute 0.1 0.0 - 0.5 K/uL   Basophils Relative 0 %   Basophils Absolute 0.0 0.0 - 0.1 K/uL   Immature Granulocytes 0 %   Abs  Immature Granulocytes 0.03 0.00 - 0.07 K/uL    Comment: Performed at [facility], [address] [place], [place] [address]  Comprehensive metabolic panel     Status: Abnormal   Collection Time: 12/31/23  5:31 PM  Result Value Ref Range   Sodium 137 135 - 145 mmol/L   Potassium 4.5 3.5 - 5.1 mmol/L   Chloride 101 98 - 111 mmol/L   CO2 21 (L) 22 - 32 mmol/L   Glucose, Bld 143 (H) 70 - 99 mg/dL    Comment: Glucose reference range applies only to samples taken after fasting for at least 8 hours.   BUN 73 (H) 8 - 23 mg/dL   Creatinine, Ser 8.46 (H) 0.44 - 1.00 mg/dL   Calcium 9.3 8.9 - 89.6 mg/dL   Total Protein 8.7 (H) 6.5 - 8.1 g/dL   Albumin 3.9 3.5 - 5.0 g/dL   AST 39 15 - 41 U/L   ALT 33 0 - 44 U/L   Alkaline Phosphatase 133 (H) 38 - 126 U/L   Total Bilirubin 0.5 0.0 - 1.2 mg/dL   GFR, Estimated 34 (L) >60 mL/min    Comment: (NOTE) Calculated using the CKD-EPI Creatinine Equation (2021)    Anion gap 15 5 - 15    Comment: Performed at [facility], [address] [place], [place] [address]  Troponin I (High Sensitivity)     Status: Abnormal   Collection Time: 12/31/23  7:26 PM  Result Value Ref Range   Troponin I (High Sensitivity) 25 (H) <18 ng/L    Comment: (NOTE) Elevated high sensitivity troponin I (hsTnI) values and significant  changes across serial measurements may suggest ACS but many other  chronic and acute conditions are known to elevate hsTnI results.  Refer to the Links section for chest pain algorithms and additional  guidance. Performed at [facility], [address], [place], [place] [address]   Brain natriuretic peptide     Status: Abnormal   Collection Time: 12/31/23  7:26 PM  Result Value Ref Range   B Natriuretic Peptide 477.2 (H) 0.0 - 100.0 pg/mL    Comment: Performed at [facility], [address] [place], [place] [address]  Magnesium     Status: None   Collection Time: 12/31/23 10:20 PM  Result Value Ref Range    Magnesium 2.2 1.7 - 2.4 mg/dL    Comment: Performed at [facility], [address], [place], [place] [address]  Troponin I (High Sensitivity)     Status: Abnormal   Collection Time: 12/31/23 10:20 PM  Result Value  Ref Range   Troponin I (High Sensitivity) 24 (H) <18 ng/L    Comment: (NOTE) Elevated high sensitivity troponin I (hsTnI) values and significant  changes across serial measurements may suggest ACS but many other  chronic and acute conditions are known to elevate hsTnI results.  Refer to the Links section for chest pain algorithms and additional  guidance. Performed at [facility], [address] [place], [place] [address]   Basic metabolic panel     Status: Abnormal   Collection Time: 01/01/24  6:15 AM  Result Value Ref Range   Sodium 139 135 - 145 mmol/L   Potassium 4.2 3.5 - 5.1 mmol/L   Chloride 100 98 - 111 mmol/L   CO2 23 22 - 32 mmol/L   Glucose, Bld 118 (H) 70 - 99 mg/dL    Comment: Glucose reference range applies only to samples taken after fasting for at least 8 hours.   BUN 71 (H) 8 - 23 mg/dL   Creatinine, Ser 8.35 (H) 0.44 - 1.00 mg/dL   Calcium 9.7 8.9 - 89.6 mg/dL   GFR, Estimated 32 (L) >60 mL/min    Comment: (NOTE) Calculated using the CKD-EPI Creatinine Equation (2021)    Anion gap 16 (H) 5 - 15    Comment: Performed at [facility], [address], [place], [place] [address]  CBC     Status: Abnormal   Collection Time: 01/01/24  6:15 AM  Result Value Ref Range   WBC 6.1 4.0 - 10.5 K/uL   RBC 3.85 (L) 3.87 - 5.11 MIL/uL   Hemoglobin 11.9 (L) 12.0 - 15.0 g/dL   HCT 63.7 63.9 - 53.9 %   MCV 94.0 80.0 - 100.0 fL   MCH 30.9 26.0 - 34.0 pg   MCHC 32.9 30.0 - 36.0 g/dL   RDW 85.6 88.4 - 84.4 %   Platelets 298 150 - 400 K/uL   nRBC 0.0 0.0 - 0.2 %    Comment: Performed at [facility], [address], [place], [place] [address]  ABO/Rh     Status: None   Collection Time: 01/01/24  6:15 AM  Result Value Ref Range    ABO/RH(D)      A POS Performed at [facility], [address], [place], [place] [address]   CBG monitoring, ED     Status: Abnormal   Collection Time: 01/01/24  8:02 AM  Result Value Ref Range   Glucose-Capillary 104 (H) 70 - 99 mg/dL    Comment: Glucose reference range applies only to samples taken after fasting for at least 8 hours.    DG Knee Right Port Result Date: 12/31/2023 CLINICAL DATA:  Fall, right knee pain EXAM: PORTABLE RIGHT KNEE - 1-2 VIEW COMPARISON:  None Available. FINDINGS: The examination is limited by suboptimal positioning and limited obliquity between images. No definite acute fracture or dislocation. Mild patellofemoral degenerative arthritis. No effusion. Soft tissues are unremarkable. IMPRESSION: 1. Limited examination. No definite acute fracture or dislocation. Electronically Signed   By: Dorethia Molt M.D.   On: 12/31/2023 21:40   DG Chest 1 View Result Date: 12/31/2023 CLINICAL DATA:  Fall with right hip fracture. EXAM: CHEST  1 VIEW COMPARISON:  11/22/2022 FINDINGS: Cardiomegaly is stable. Unchanged mediastinal contours, aortic atherosclerosis. Unchanged vascular congestion. Right infrahilar atelectasis. No confluent consolidation. No pneumothorax or pleural effusion. Left axillary surgical clips. The bones are under mineralized. IMPRESSION: Cardiomegaly and vascular congestion. Right infrahilar atelectasis. Electronically Signed  By: Andrea Gasman M.D.   On: 12/31/2023 17:15   DG Hip Unilat W or Wo Pelvis 2-3 Views Right Result Date: 12/31/2023 CLINICAL DATA:  Status post fall with suspected hip fracture. EXAM: DG HIP (WITH OR WITHOUT PELVIS) 2-3V RIGHT COMPARISON:  None Available. FINDINGS: Technically limited due to positioning. Displaced right femoral neck fracture. Approximately 3 cm proximal migration of the femoral shaft. Femoral head is well seated. Bony pelvis including pubic rami are grossly intact. The bones are subjectively under mineralized.  IMPRESSION: Displaced right femoral neck fracture. Electronically Signed   By: Andrea Gasman M.D.   On: 12/31/2023 17:14    Review of Systems  HENT:  Negative for ear discharge, ear pain, hearing loss and tinnitus.   Eyes:  Negative for photophobia and pain.  Respiratory:  Negative for cough and shortness of breath.   Cardiovascular:  Negative for chest pain.  Gastrointestinal:  Negative for abdominal pain, nausea and vomiting.  Genitourinary:  Negative for dysuria, flank pain, frequency and urgency.  Musculoskeletal:  Positive for arthralgias (Right hip). Negative for back pain, myalgias and neck pain.  Neurological:  Negative for dizziness and headaches.  Hematological:  Does not bruise/bleed easily.  Psychiatric/Behavioral:  The patient is not nervous/anxious.    Blood pressure (!) 181/78, pulse 71, temperature 98.3 F (36.8 C), temperature source Oral, resp. rate 15, height 5' 5 (1.651 m), SpO2 93%. Physical Exam Constitutional:      General: She is not in acute distress.    Appearance: She is well-developed. She is not diaphoretic.  HENT:     Head: Normocephalic and atraumatic.  Eyes:     General: No scleral icterus.       Right eye: No discharge.        Left eye: No discharge.     Conjunctiva/sclera: Conjunctivae normal.  Cardiovascular:     Rate and Rhythm: Normal rate and regular rhythm.  Pulmonary:     Effort: Pulmonary effort is normal. No respiratory distress.  Musculoskeletal:     Cervical back: Normal range of motion.     Comments: RLE No traumatic wounds, ecchymosis, or rash  Mild TTP hip  No knee or ankle effusion  Knee stable to varus/ valgus and anterior/posterior stress  Sens DPN, SPN, TN intact  Motor EHL, ext, flex, evers 5/5  DP 2+, PT 2+, No significant edema  Skin:    General: Skin is warm and dry.  Neurological:     Mental Status: She is alert.  Psychiatric:        Mood and Affect: Mood normal.        Behavior: Behavior normal.      Assessment/Plan: Right hip fx -- Plan THA today with Dr. Kendal. Please keep NPO. Multiple medical problems including CP w/right hemiplegia, hypertension, hyperlipidemia, type 2 diabetes, HFpEF and moderate aortic stenosis -- per primary service    Ozell DOROTHA Ned, PA-C Orthopedic Surgery [phone] 01/01/2024, 9:08 AM

## 2024-01-02 ENCOUNTER — Inpatient Hospital Stay (HOSPITAL_COMMUNITY)

## 2024-01-02 ENCOUNTER — Encounter (HOSPITAL_COMMUNITY): Payer: Self-pay | Admitting: Student

## 2024-01-02 DIAGNOSIS — I350 Nonrheumatic aortic (valve) stenosis: Secondary | ICD-10-CM | POA: Diagnosis not present

## 2024-01-02 DIAGNOSIS — M7989 Other specified soft tissue disorders: Secondary | ICD-10-CM

## 2024-01-02 DIAGNOSIS — S72001A Fracture of unspecified part of neck of right femur, initial encounter for closed fracture: Secondary | ICD-10-CM | POA: Diagnosis not present

## 2024-01-02 LAB — ECHOCARDIOGRAM COMPLETE
AR max vel: 1.51 cm2
AV Area VTI: 1.61 cm2
AV Area mean vel: 1.61 cm2
AV Mean grad: 21 mmHg
AV Peak grad: 39.9 mmHg
Ao pk vel: 3.16 m/s
Area-P 1/2: 3.53 cm2
Height: 65 in
S' Lateral: 3.3 cm

## 2024-01-02 LAB — CBC
HCT: 27.2 % — ABNORMAL LOW (ref 36.0–46.0)
Hemoglobin: 8.9 g/dL — ABNORMAL LOW (ref 12.0–15.0)
MCH: 30.9 pg (ref 26.0–34.0)
MCHC: 32.7 g/dL (ref 30.0–36.0)
MCV: 94.4 fL (ref 80.0–100.0)
Platelets: 223 K/uL (ref 150–400)
RBC: 2.88 MIL/uL — ABNORMAL LOW (ref 3.87–5.11)
RDW: 14.3 % (ref 11.5–15.5)
WBC: 7 K/uL (ref 4.0–10.5)
nRBC: 0 % (ref 0.0–0.2)

## 2024-01-02 LAB — COMPREHENSIVE METABOLIC PANEL WITH GFR
ALT: 41 U/L (ref 0–44)
AST: 75 U/L — ABNORMAL HIGH (ref 15–41)
Albumin: 3.3 g/dL — ABNORMAL LOW (ref 3.5–5.0)
Alkaline Phosphatase: 107 U/L (ref 38–126)
Anion gap: 15 (ref 5–15)
BUN: 66 mg/dL — ABNORMAL HIGH (ref 8–23)
CO2: 21 mmol/L — ABNORMAL LOW (ref 22–32)
Calcium: 8.8 mg/dL — ABNORMAL LOW (ref 8.9–10.3)
Chloride: 103 mmol/L (ref 98–111)
Creatinine, Ser: 1.57 mg/dL — ABNORMAL HIGH (ref 0.44–1.00)
GFR, Estimated: 33 mL/min — ABNORMAL LOW (ref >60)
Glucose, Bld: 102 mg/dL — ABNORMAL HIGH (ref 70–99)
Potassium: 3.9 mmol/L (ref 3.5–5.1)
Sodium: 139 mmol/L (ref 135–145)
Total Bilirubin: 0.4 mg/dL (ref 0.0–1.2)
Total Protein: 7.1 g/dL (ref 6.5–8.1)

## 2024-01-02 LAB — GLUCOSE, CAPILLARY
Glucose-Capillary: 122 mg/dL — ABNORMAL HIGH (ref 70–99)
Glucose-Capillary: 125 mg/dL — ABNORMAL HIGH (ref 70–99)
Glucose-Capillary: 130 mg/dL — ABNORMAL HIGH (ref 70–99)
Glucose-Capillary: 142 mg/dL — ABNORMAL HIGH (ref 70–99)

## 2024-01-02 LAB — MAGNESIUM: Magnesium: 2 mg/dL (ref 1.7–2.4)

## 2024-01-02 MED ORDER — VITAMIN D (ERGOCALCIFEROL) 1.25 MG (50000 UNIT) PO CAPS
50000.0000 [IU] | ORAL_CAPSULE | ORAL | Status: DC
  Administered 2024-01-02: 50000 [IU] via ORAL
  Filled 2024-01-02: qty 1

## 2024-01-02 MED ORDER — PERFLUTREN LIPID MICROSPHERE
1.0000 mL | INTRAVENOUS | Status: AC | PRN
  Administered 2024-01-02: 2 mL via INTRAVENOUS

## 2024-01-02 NOTE — Progress Notes (Signed)
 PROGRESS NOTE  Donna Becker FMW:[id number] DOB: 23-Mar-1946 DOA: 12/31/2023 PCP: Rexanne Ingle, MD   LOS: 2 days   Brief Narrative / Interim history: 78 year old female with HTN, HLD, DM2, seizure disorder, cerebral palsy with right-sided weakness, moderate AS who comes into the hospital after a fall and right hip pain.  She denies any loss of consciousness.  Imaging in the ED showed right femoral neck fracture, orthopedic surgery was consulted and she was admitted to the hospital  Subjective / 24h Interval events: Doing well today, denies any chest pain, denies any shortness of breath.  Assesement and Plan: Principal problem Displaced right femoral neck fracture - Patient presented to the hospital with right femoral neck fracture status post fall.  Orthopedic surgery consulted, appreciate input, plan for operative repair today.  PT eval to follow  Active problems CHF exacerbation - Pt with mild CHF exacerbation, slightly elevated BNP, received Lasix  x 1 on admission.  She is comfortable on room air, hold further diuresis for now - Echo has been ordered, study done but not read by cardiology at  Acute metabolic encephalopathy -has been having confusion while here, family does not report any history of dementia and usually she is alert and oriented and appropriate.  Suspected due to pain medications  AKI versus CKD - Patient with creatinine elevation which may be secondary to an AKI versus CKD.  It is a difficult to differentiate as patient's last creatinine is over 1 year ago. - Creatinine remained stable around 1.5  Hypertension -continue hydralazine , Coreg , nifedipine , blood pressure still on the high side probably due to pain.  Continue to monitor  Hyperlipidemia - Restart home statin  Type 2 diabetes - Repeat A1c at 1.5.  Placed on SSI.  Lymphedema - left sided lymphedema.  Will get left upper extremity Doppler given concern for DVT.  This is pending today  Gout - Holding  home allupurinol given AKI. Re-dose if needed based on creatinine if pt has CKD.   Scheduled Meds:  carbamazepine   200 mg Oral TID   carvedilol   25 mg Oral BID WC   cycloSPORINE   1 drop Both Eyes BID   docusate sodium  100 mg Oral BID   enoxaparin (LOVENOX) injection  40 mg Subcutaneous Q24H   hydrALAZINE   100 mg Oral BID   insulin aspart  0-6 Units Subcutaneous TID WC   Lifitegrast   1 drop Both Eyes BID   NIFEdipine   90 mg Oral Daily   pravastatin   20 mg Oral Daily   Vitamin D (Ergocalciferol)  50,000 Units Oral Q Fri   Continuous Infusions:  sodium chloride  Stopped (01/02/24 0243)   PRN Meds:.acetaminophen **OR** acetaminophen, diphenhydrAMINE, HYDROmorphone (DILAUDID) injection, magnesium citrate, menthol **OR** phenol, methocarbamol **OR** methocarbamol (ROBAXIN) injection, metoCLOPramide **OR** metoCLOPramide (REGLAN) injection, ondansetron  **OR** ondansetron  (ZOFRAN ) IV, oxyCODONE, perflutren  lipid microspheres (DEFINITY ) IV suspension, senna-docusate  Current Outpatient Medications  Medication Instructions   allopurinol  (ZYLOPRIM ) 100 MG tablet Take 1 tablet (100 mg total) by mouth in the morning for gout.   B Complex-C (B-COMPLEX WITH VITAMIN C) tablet 1 tablet, Daily   bimatoprost (LUMIGAN) 0.01 % SOLN 1 drop, Daily at bedtime   bisacodyl  (DULCOLAX) 5 MG EC tablet Take by mouth as directed per md.   carbamazepine  (TEGRETOL ) 200 MG tablet Take 1 tablet (200 mg total) by mouth 3 (three) times daily for seizure.   carvedilol  (COREG ) 25 MG tablet TAKE ONE TABLET BY MOUTH TWICE DAILY Needs appointment for further refills   carvedilol  (COREG )  25 MG tablet Take 1 tablet (25 mg total) by mouth 2 (two) times daily with food.   ciprofloxacin  (CIPRO ) 250 mg, Oral, Every 12 hours   colchicine 0.6 mg, Daily   cycloSPORINE  (RESTASIS ) 0.05 % ophthalmic emulsion 1 drop, Both Eyes, 2 times daily   furosemide  (LASIX ) 20 mg, Oral, Daily   gabapentin  (NEURONTIN ) 300 MG capsule Take 1 capsule  (300 mg total) by mouth at night.   hydrALAZINE  (APRESOLINE ) 100 MG tablet Take 1 tablet (100 mg total) by mouth 2 (two) times daily, once in the morning and once at noon.   hydrALAZINE  (APRESOLINE ) 100 mg, 2 times daily   Lifitegrast  (XIIDRA ) 5 % SOLN Instill 1 drop into both eyes twice a day   losartan (COZAAR) 100 mg, Every evening   Multiple Vitamin (MULTIVITAMIN WITH MINERALS) TABS tablet 1 tablet, Daily   NIFEdipine  (ADALAT  CC) 90 MG 24 hr tablet Take 1 tablet (90 mg total) by mouth daily on an empty stomach.   NIFEdipine  (ADALAT  CC) 90 mg, Oral, Daily   polyethylene glycol (GOLYTELY) 236 g solution Take as directed per doctor instructions.   pravastatin  (PRAVACHOL ) 20 mg, Oral, Daily   Probiotic Product (PROBIOTIC DAILY) CAPS 1 capsule, Daily    Diet Orders (From admission, onward)     Start     Ordered   01/01/24 1722  Diet Heart Room service appropriate? Yes; Fluid consistency: Thin  Diet effective now       Question Answer Comment  Room service appropriate? Yes   Fluid consistency: Thin      01/01/24 1721            DVT prophylaxis: enoxaparin (LOVENOX) injection 40 mg Start: 01/02/24 0800 SCDs Start: 01/01/24 1722 SCDs Start: 12/31/23 2005   Lab Results  Component Value Date   PLT 223 01/02/2024      Code Status: Full Code  Family Communication: No family at bedside  Status is: Inpatient Remains inpatient appropriate because: Severity of illness   Level of care: Telemetry Cardiac  Consultants:  Orthopedic surgery  Objective: Vitals:   01/01/24 2001 01/01/24 2103 01/02/24 0449 01/02/24 0700  BP: (!) 144/81 (!) 144/81 (!) 177/97 (!) 178/68  Pulse: (!) 52  81 72  Resp: 15  15 16   Temp: 98.4 F (36.9 C)  98.5 F (36.9 C) 98.1 F (36.7 C)  TempSrc: Oral  Oral   SpO2: 100%  94%   Height:        Intake/Output Summary (Last 24 hours) at 01/02/2024 1159 Last data filed at 01/02/2024 0744 Gross per 24 hour  Intake 1576.56 ml  Output 750 ml  Net  826.56 ml   Wt Readings from Last 3 Encounters:  06/02/23 59 kg  04/22/23 60 kg  11/28/22 60.4 kg    Examination: Constitutional: NAD Eyes: lids and conjunctivae normal, no scleral icterus ENMT: mmm Neck: normal, supple Respiratory: clear to auscultation bilaterally, no wheezing, no crackles.  Cardiovascular: Regular rate and rhythm, no murmurs / rubs / gallops. No LE edema. Abdomen: soft, no distention, no tenderness. Bowel sounds positive.   Data Reviewed: I have independently reviewed following labs and imaging studies   CBC Recent Labs  Lab 12/31/23 1731 01/01/24 0615 01/01/24 1811 01/02/24 0440  WBC 8.4 6.1 11.5* 7.0  HGB 11.2* 11.9* 10.9* 8.9*  HCT 34.7* 36.2 33.6* 27.2*  PLT 283 298 254 223  MCV 96.1 94.0 95.7 94.4  MCH 31.0 30.9 31.1 30.9  MCHC 32.3 32.9 32.4 32.7  RDW  14.2 14.3 14.4 14.3  LYMPHSABS 1.2  --   --   --   MONOABS 0.3  --   --   --   EOSABS 0.1  --   --   --   BASOSABS 0.0  --   --   --     Recent Labs  Lab 12/31/23 1731 12/31/23 1926 12/31/23 2220 01/01/24 0615 01/01/24 0951 01/01/24 1811 01/02/24 0440  NA 137  --   --  139  --   --  139  K 4.5  --   --  4.2  --   --  3.9  CL 101  --   --  100  --   --  103  CO2 21*  --   --  23  --   --  21*  GLUCOSE 143*  --   --  118*  --   --  102*  BUN 73*  --   --  71*  --   --  66*  CREATININE 1.53*  --   --  1.64*  --  1.52* 1.57*  CALCIUM 9.3  --   --  9.7  --   --  8.8*  AST 39  --   --   --   --   --  75*  ALT 33  --   --   --   --   --  41  ALKPHOS 133*  --   --   --   --   --  107  BILITOT 0.5  --   --   --   --   --  0.4  ALBUMIN 3.9  --   --   --   --   --  3.3*  MG  --   --  2.2  --   --   --  2.0  HGBA1C  --   --   --   --  5.5  --   --   BNP  --  477.2*  --   --   --   --   --     ------------------------------------------------------------------------------------------------------------------ No results for input(s): CHOL, HDL, LDLCALC, TRIG, CHOLHDL, LDLDIRECT in  the last 72 hours.  Lab Results  Component Value Date   HGBA1C 5.5 01/01/2024   ------------------------------------------------------------------------------------------------------------------ No results for input(s): TSH, T4TOTAL, T3FREE, THYROIDAB in the last 72 hours.  Invalid input(s): FREET3  Cardiac Enzymes No results for input(s): CKMB, TROPONINI, MYOGLOBIN in the last 168 hours.  Invalid input(s): CK ------------------------------------------------------------------------------------------------------------------    Component Value Date/Time   BNP 477.2 (H) 12/31/2023 1926    CBG: Recent Labs  Lab 01/01/24 1135 01/01/24 1613 01/01/24 2142 01/02/24 0634 01/02/24 1154  GLUCAP 137* 155* 139* 122* 125*    Recent Results (from the past 240 hours)  Surgical pcr screen     Status: None   Collection Time: 01/01/24  8:01 AM   Specimen: Nasal Mucosa; Nasal Swab  Result Value Ref Range Status   MRSA, PCR NEGATIVE NEGATIVE Final   Staphylococcus aureus NEGATIVE NEGATIVE Final    Comment: (NOTE) The Xpert SA Assay (FDA approved for NASAL specimens in patients 62 years of age and older), is one component of a comprehensive surveillance program. It is not intended to diagnose infection nor to guide or monitor treatment. Performed at [facility], [address], [place], [place] [address]      Radiology Studies: DG HIP UNILAT W OR W/O PELVIS 2-3 VIEWS  RIGHT Result Date: 01/01/2024 CLINICAL DATA:  Right hip fracture post arthroplasty. EXAM: DG HIP (WITH OR WITHOUT PELVIS) 2-3V RIGHT COMPARISON:  12/31/2023 FINDINGS: Interval postoperative changes with placement of a right total hip arthroplasty using non cemented components. Components appear well seated. No acute fracture or dislocation. Soft tissue gas is consistent with recent surgery. Mild degenerative changes in the left hip. IMPRESSION: Interval right total hip arthroplasty. Components appear  well seated. Electronically Signed   By: Elsie Gravely M.D.   On: 01/01/2024 20:21   DG HIP UNILAT WITH PELVIS 2-3 VIEWS RIGHT Result Date: 01/01/2024 CLINICAL DATA:  Elective surgery. EXAM: DG HIP (WITH OR WITHOUT PELVIS) 2-3V RIGHT COMPARISON:  Radiograph yesterday FINDINGS: Eight fluoroscopic spot views of the pelvis and right hip obtained in the operating room. Sequential images during hip arthroplasty. Fluoroscopy time 21 seconds. Dose 1.56 mGy. IMPRESSION: Intraoperative fluoroscopy during right hip arthroplasty. Electronically Signed   By: Andrea Gasman M.D.   On: 01/01/2024 16:21   DG C-Arm 1-60 Min-No Report Result Date: 01/01/2024 Fluoroscopy was utilized by the requesting physician.  No radiographic interpretation.   DG C-Arm 1-60 Min-No Report Result Date: 01/01/2024 Fluoroscopy was utilized by the requesting physician.  No radiographic interpretation.     Nilda Fendt, MD, PhD [facility]  Between 7 am - 7 pm I am available, please contact me via Amion (for emergencies) or Securechat (non urgent messages)  Between 7 pm - 7 am I am not available, please contact night coverage MD/APP via Amion

## 2024-01-02 NOTE — Progress Notes (Signed)
 VASCULAR LAB    Left upper extremity venous duplex has been performed.  See CV proc for preliminary results.   Robt Okuda, RVT 01/02/2024, 3:23 PM

## 2024-01-02 NOTE — Progress Notes (Signed)
 Orthopaedic Trauma Progress Note  SUBJECTIVE: Patient doing fairly well this morning.  Pain in the right hip is controlled.  Has not been out of bed yet since surgery.  She has no numbness or tingling throughout the right lower extremity.  No chest pain. No SOB. No nausea/vomiting. No other complaints.   OBJECTIVE:  Vitals:   01/02/24 0449 01/02/24 0700  BP: (!) 177/97 (!) 178/68  Pulse: 81 72  Resp: 15 16  Temp: 98.5 F (36.9 C) 98.1 F (36.7 C)  SpO2: 94%     Opiates Today (MME): Today's  total administered Morphine Milligram Equivalents: 15 Opiates Yesterday (MME): Yesterday's total administered Morphine Milligram Equivalents: 68  General: Sitting up in bed, no acute distress.  Pleasant and cooperative Respiratory: No increased work of breathing.  Operative Extremity (right lower extremity): Aquacel dressing is clean, dry, intact.  Some tenderness with palpation over the lateral hip and throughout the thigh.  Soreness to the calf but no areas of significant pain.  Ankle and toe motion limited at baseline due to CP.  Some diminished sensation over the dorsum of the foot.  Sensation intact to light touch plantar aspect of foot.  Foot warm and well-perfused.  IMAGING: Stable post op imaging.   LABS:  Results for orders placed or performed during the hospital encounter of 12/31/23 (from the past 24 hours)  Glucose, capillary     Status: Abnormal   Collection Time: 01/01/24 11:35 AM  Result Value Ref Range   Glucose-Capillary 137 (H) 70 - 99 mg/dL  Glucose, capillary     Status: Abnormal   Collection Time: 01/01/24  4:13 PM  Result Value Ref Range   Glucose-Capillary 155 (H) 70 - 99 mg/dL  VITAMIN D 25 Hydroxy (Vit-D Deficiency, Fractures)     Status: Abnormal   Collection Time: 01/01/24  6:11 PM  Result Value Ref Range   Vit D, 25-Hydroxy 14.54 (L) 30 - 100 ng/mL  CBC     Status: Abnormal   Collection Time: 01/01/24  6:11 PM  Result Value Ref Range   WBC 11.5 (H) 4.0 - 10.5  K/uL   RBC 3.51 (L) 3.87 - 5.11 MIL/uL   Hemoglobin 10.9 (L) 12.0 - 15.0 g/dL   HCT 66.3 (L) 63.9 - 53.9 %   MCV 95.7 80.0 - 100.0 fL   MCH 31.1 26.0 - 34.0 pg   MCHC 32.4 30.0 - 36.0 g/dL   RDW 85.5 88.4 - 84.4 %   Platelets 254 150 - 400 K/uL   nRBC 0.0 0.0 - 0.2 %  Creatinine, serum     Status: Abnormal   Collection Time: 01/01/24  6:11 PM  Result Value Ref Range   Creatinine, Ser 1.52 (H) 0.44 - 1.00 mg/dL   GFR, Estimated 35 (L) >60 mL/min  Glucose, capillary     Status: Abnormal   Collection Time: 01/01/24  9:42 PM  Result Value Ref Range   Glucose-Capillary 139 (H) 70 - 99 mg/dL  Comprehensive metabolic panel with GFR     Status: Abnormal   Collection Time: 01/02/24  4:40 AM  Result Value Ref Range   Sodium 139 135 - 145 mmol/L   Potassium 3.9 3.5 - 5.1 mmol/L   Chloride 103 98 - 111 mmol/L   CO2 21 (L) 22 - 32 mmol/L   Glucose, Bld 102 (H) 70 - 99 mg/dL   BUN 66 (H) 8 - 23 mg/dL   Creatinine, Ser 8.42 (H) 0.44 - 1.00 mg/dL   Calcium  8.8 (L) 8.9 - 10.3 mg/dL   Total Protein 7.1 6.5 - 8.1 g/dL   Albumin 3.3 (L) 3.5 - 5.0 g/dL   AST 75 (H) 15 - 41 U/L   ALT 41 0 - 44 U/L   Alkaline Phosphatase 107 38 - 126 U/L   Total Bilirubin 0.4 0.0 - 1.2 mg/dL   GFR, Estimated 33 (L) >60 mL/min   Anion gap 15 5 - 15  CBC     Status: Abnormal   Collection Time: 01/02/24  4:40 AM  Result Value Ref Range   WBC 7.0 4.0 - 10.5 K/uL   RBC 2.88 (L) 3.87 - 5.11 MIL/uL   Hemoglobin 8.9 (L) 12.0 - 15.0 g/dL   HCT 72.7 (L) 63.9 - 53.9 %   MCV 94.4 80.0 - 100.0 fL   MCH 30.9 26.0 - 34.0 pg   MCHC 32.7 30.0 - 36.0 g/dL   RDW 85.6 88.4 - 84.4 %   Platelets 223 150 - 400 K/uL   nRBC 0.0 0.0 - 0.2 %  Magnesium     Status: None   Collection Time: 01/02/24  4:40 AM  Result Value Ref Range   Magnesium 2.0 1.7 - 2.4 mg/dL  Glucose, capillary     Status: Abnormal   Collection Time: 01/02/24  6:34 AM  Result Value Ref Range   Glucose-Capillary 122 (H) 70 - 99 mg/dL    ASSESSMENT:  Donna Becker is a 78 y.o. female, 1 Day Post-Op s/p fall Procedures: RIGHT TOTAL HIP ARTHROPLASTY FOR FRACTURE, ANTERIOR APPROACH  CV/Blood loss: Acute blood loss anemia, Hgb 8.9 this AM. Hemodynamically stable  PLAN: Weightbearing: WBAT RLE ROM: Unrestricted ROM.  No hip precautions needed Incisional and dressing care: Dressings left intact until follow-up  Showering: Okay to shower, Aquacel dressing may get wet. Orthopedic device(s): None  Pain management:  1. Tylenol 650 mg q 6 hours PRN 2. Robaxin 500 mg q 6 hours PRN 3. Oxycodone 2.5-5 mg q 4 hours PRN 4. Dilaudid 0.5 mg q 4 hours PRN VTE prophylaxis: Lovenox, SCDs ID:  Ancef 2gm post op Foley/Lines:  No foley, KVO IVFs Impediments to Fracture Healing: Vitamin D level 14, started on supplementation Dispo: PT/OT eval today.    D/C recommendations: - Oxycodone, Robaxin, Tylenol for pain control - ASA 325 mg daily x30 days for DVT prophylaxis - Continue 50,000 units Vit D supplementation  Follow - up plan: 2 weeks after d/c for wound check and repeat x-rays   Contact information:  Franky Light MD, Lauraine Moores PA-C. After hours and holidays please check [email or web address] for group call information for [facility]   Lauraine PATRIC Moores, PA-C [phone] (office) [email or web address]

## 2024-01-02 NOTE — Evaluation (Signed)
 Physical Therapy Evaluation Patient Details Name: Donna Becker MRN: [id number] DOB: May 16, 1945 Today's Date: 01/02/2024  History of Present Illness  Donna Becker is an 78 y.o. female admitted 12/31/23 after ground level fall, sustaining a right displaced femoral neck fx. Pt s/p R THA 10/9. PMHx: CP with R hemiplegia, T2DM, HTN,  HFpEF, moderate aortic stenosis, arthritis, breast CA, and seizures.   Clinical Impression  Pt admitted with above diagnosis. Examination limited by impaired cognition, pt being a poor historian, and lack of family present. Unsure of PLOF or home set-up. Per chart review, pt ambulates using RW, needs assistance with ADLs/IADLs, and lives with her brother. Pt currently with functional limitations due to the deficits listed below (see PT Problem List). She required totalA x2 for bed mobility. Pt with significant posterior and left lateral lean while sitting EOB requiring max-totalA to maintain upright posture. She became increasing restless flailing arms around and pulling on PT/Mobility Specialists. Returned pt to bed and reapplied mittens. Pt will benefit from acute skilled PT to maximize her independence and safety with mobility to allow discharge. Recommend continued inpatient follow up therapy, <3 hours/day.    If plan is discharge home, recommend the following: Two people to help with walking and/or transfers;Two people to help with bathing/dressing/bathroom;Assistance with cooking/housework;Assist for transportation;Help with stairs or ramp for entrance;Supervision due to cognitive status   Can travel by private vehicle   No    Equipment Recommendations Hospital bed;Hoyer lift;Wheelchair (measurements PT);Wheelchair cushion (measurements PT);BSC/3in1  Recommendations for Other Services       Functional Status Assessment Patient has had a recent decline in their functional status and demonstrates the ability to make significant improvements in function in  a reasonable and predictable amount of time.     Precautions / Restrictions Precautions Precautions: Fall Recall of Precautions/Restrictions: Impaired Precaution/Restrictions Comments: Direct anterior approach, no hip precautions. Restrictions Weight Bearing Restrictions Per Provider Order: Yes RLE Weight Bearing Per Provider Order: Weight bearing as tolerated      Mobility  Bed Mobility Overal bed mobility: Needs Assistance Bed Mobility: Supine to Sit, Sit to Supine     Supine to sit: Max assist, Total assist, +2 for physical assistance Sit to supine: Max assist, Total assist, +2 for physical assistance   General bed mobility comments: Pt sat up on L side of bed with increased time. Cues for sequencing. She slowly brought LLE towards EOB. Pt pulled on bedrail with BUE. Assist to manage RLE, elevate trunk, and scoot hips fwd. Returning to bed assist to bring BLE back into bed and lower trunk down. Repositioned using bed features and bed pad, +2 assist.    Transfers                   General transfer comment: Deferred for pt/therapist/mobility specialists safety as pt was becoming increasingly restless and not following any commands. Recommend maximove for transfers.    Ambulation/Gait                  Stairs            Wheelchair Mobility     Tilt Bed    Modified Rankin (Stroke Patients Only)       Balance Overall balance assessment: Needs assistance Sitting-balance support: Bilateral upper extremity supported, Feet supported Sitting balance-Leahy Scale: Zero Sitting balance - Comments: Pt sat EOB with max-totalA to maintain upright posture. She had a significant posterior and left lateral lean, likely d/t fear of falling and offweighting  R hip d/t pain. Pt clinging to anything with BUE, swing arms all around. Postural control: Posterior lean, Left lateral lean                                   Pertinent Vitals/Pain Pain  Assessment Pain Assessment: Faces Faces Pain Scale: Hurts even more Pain Location: BLE (R>L) Pain Descriptors / Indicators: Operative site guarding, Grimacing, Moaning Pain Intervention(s): Monitored during session, Limited activity within patient's tolerance, Repositioned    Home Living Family/patient expects to be discharged to:: Private residence Living Arrangements: Other relatives (Brother) Available Help at Discharge: Family Type of Home: House Home Access: Stairs to enter         Home Equipment: Agricultural consultant (2 wheels)      Prior Function Prior Level of Function : Patient poor historian/Family not available;History of Falls (last six months)             Mobility Comments: Per chart review, pt ambulates using RW. At least 1 fall leading to current admission. ADLs Comments: Per chart review,s he needs help with some ADLs and IADLs which is provided by her sister who visits her frequently.     Extremity/Trunk Assessment   Upper Extremity Assessment Upper Extremity Assessment: Defer to OT evaluation    Lower Extremity Assessment Lower Extremity Assessment: Difficult to assess due to impaired cognition;Generalized weakness;RLE deficits/detail RLE Deficits / Details: Pt POD 1 s/p THA. She rests in hip adduction and knee flexion likely secondary to CP. She has a hx of R hemiplegia. Decreased AROM. Pt resistant to PROM attempts and crying out in pain. Grossly anticipate 2/5 strength. RLE: Unable to fully assess due to pain RLE Sensation: decreased proprioception RLE Coordination: decreased gross motor    Cervical / Trunk Assessment Cervical / Trunk Assessment: Kyphotic  Communication   Communication Communication: No apparent difficulties    Cognition Arousal: Alert Behavior During Therapy: Anxious, Restless   PT - Cognitive impairments: History of cognitive impairments, No family/caregiver present to determine baseline (Hx of CP)                        PT - Cognition Comments: Pt A,O to self only. She was nervous to mobilize. Pt perseverated on please help me. She was constantly searching for B hands to be holding onto something, pulling on PT's/Mobility Speclialists clothes, arm, and leg. Provided reasurement, consolment, and redirection without improvement in mentation. Following commands: Impaired Following commands impaired: Follows one step commands inconsistently     Cueing Cueing Techniques: Verbal cues, Gestural cues, Tactile cues, Visual cues     General Comments      Exercises     Assessment/Plan    PT Assessment Patient needs continued PT services  PT Problem List Decreased strength;Decreased activity tolerance;Decreased balance;Decreased mobility;Decreased cognition;Decreased knowledge of use of DME;Decreased safety awareness;Pain       PT Treatment Interventions DME instruction;Gait training;Stair training;Functional mobility training;Therapeutic activities;Therapeutic exercise;Balance training;Patient/family education;Wheelchair mobility training    PT Goals (Current goals can be found in the Care Plan section)  Acute Rehab PT Goals PT Goal Formulation: Patient unable to participate in goal setting Time For Goal Achievement: 01/16/24 Potential to Achieve Goals: Fair    Frequency Min 2X/week     Co-evaluation               AM-PAC PT 6 Clicks Mobility  Outcome Measure Help needed  turning from your back to your side while in a flat bed without using bedrails?: Total Help needed moving from lying on your back to sitting on the side of a flat bed without using bedrails?: Total Help needed moving to and from a bed to a chair (including a wheelchair)?: Total Help needed standing up from a chair using your arms (e.g., wheelchair or bedside chair)?: Total Help needed to walk in hospital room?: Total Help needed climbing 3-5 steps with a railing? : Total 6 Click Score: 6    End of Session   Activity  Tolerance: Patient limited by pain Patient left: in bed;with call bell/phone within reach;with bed alarm set;Other (comment) (with mittens reapplied) Nurse Communication: Mobility status;Need for lift equipment PT Visit Diagnosis: Pain;Other abnormalities of gait and mobility (R26.89);Difficulty in walking, not elsewhere classified (R26.2);Unsteadiness on feet (R26.81) Pain - Right/Left: Right Pain - part of body: Hip    Time: 1540-1555 PT Time Calculation (min) (ACUTE ONLY): 15 min   Charges:   PT Evaluation $PT Eval Moderate Complexity: 1 Mod   PT General Charges $$ ACUTE PT VISIT: 1 Visit         Randall SAUNDERS, PT, DPT Acute Rehabilitation Services Office: [phone] Secure Chat Preferred  Delon CHRISTELLA Callander 01/02/2024, 5:06 PM

## 2024-01-02 NOTE — Anesthesia Postprocedure Evaluation (Signed)
 Anesthesia Post Note  Patient: Donna Becker  Procedure(s) Performed: ARTHROPLASTY, HIP, TOTAL, ANTERIOR APPROACH, RIGHT (Right: Hip)     Patient location during evaluation: PACU Anesthesia Type: General Level of consciousness: awake and alert Pain management: pain level controlled Vital Signs Assessment: post-procedure vital signs reviewed and stable Respiratory status: spontaneous breathing, nonlabored ventilation and respiratory function stable Cardiovascular status: blood pressure returned to baseline and stable Postop Assessment: no apparent nausea or vomiting Anesthetic complications: no   No notable events documented.  Last Vitals:  Vitals:   01/01/24 2103 01/02/24 0449  BP: (!) 144/81 (!) 177/97  Pulse:  81  Resp:  15  Temp:  36.9 C  SpO2:  94%    Last Pain:  Vitals:   01/02/24 0639  TempSrc:   PainSc: Asleep                 Butler Levander Pinal

## 2024-01-02 NOTE — Plan of Care (Signed)
   Problem: Education: Goal: Ability to describe self-care measures that may prevent or decrease complications (Diabetes Survival Skills Education) will improve Outcome: Progressing Goal: Individualized Educational Video(s) Outcome: Progressing   Problem: Coping: Goal: Ability to adjust to condition or change in health will improve Outcome: Progressing   Problem: Fluid Volume: Goal: Ability to maintain a balanced intake and output will improve Outcome: Progressing   Problem: Health Behavior/Discharge Planning: Goal: Ability to identify and utilize available resources and services will improve Outcome: Progressing Goal: Ability to manage health-related needs will improve Outcome: Progressing   Problem: Metabolic: Goal: Ability to maintain appropriate glucose levels will improve Outcome: Progressing   Problem: Nutritional: Goal: Maintenance of adequate nutrition will improve Outcome: Progressing Goal: Progress toward achieving an optimal weight will improve Outcome: Progressing   Problem: Skin Integrity: Goal: Risk for impaired skin integrity will decrease Outcome: Progressing   Problem: Tissue Perfusion: Goal: Adequacy of tissue perfusion will improve Outcome: Progressing   Problem: Education: Goal: Knowledge of General Education information will improve Description: Including pain rating scale, medication(s)/side effects and non-pharmacologic comfort measures Outcome: Progressing   Problem: Health Behavior/Discharge Planning: Goal: Ability to manage health-related needs will improve Outcome: Progressing   Problem: Clinical Measurements: Goal: Ability to maintain clinical measurements within normal limits will improve Outcome: Progressing Goal: Will remain free from infection Outcome: Progressing Goal: Diagnostic test results will improve Outcome: Progressing Goal: Respiratory complications will improve Outcome: Progressing Goal: Cardiovascular complication will  be avoided Outcome: Progressing   Problem: Activity: Goal: Risk for activity intolerance will decrease Outcome: Progressing   Problem: Nutrition: Goal: Adequate nutrition will be maintained Outcome: Progressing   Problem: Coping: Goal: Level of anxiety will decrease Outcome: Progressing   Problem: Elimination: Goal: Will not experience complications related to bowel motility Outcome: Progressing Goal: Will not experience complications related to urinary retention Outcome: Progressing   Problem: Pain Managment: Goal: General experience of comfort will improve and/or be controlled Outcome: Progressing   Problem: Safety: Goal: Ability to remain free from injury will improve Outcome: Progressing   Problem: Skin Integrity: Goal: Risk for impaired skin integrity will decrease Outcome: Progressing   Problem: Education: Goal: Knowledge of the prescribed therapeutic regimen will improve Outcome: Progressing Goal: Understanding of discharge needs will improve Outcome: Progressing Goal: Individualized Educational Video(s) Outcome: Progressing   Problem: Activity: Goal: Ability to avoid complications of mobility impairment will improve Outcome: Progressing Goal: Ability to tolerate increased activity will improve Outcome: Progressing   Problem: Clinical Measurements: Goal: Postoperative complications will be avoided or minimized Outcome: Progressing   Problem: Pain Management: Goal: Pain level will decrease with appropriate interventions Outcome: Progressing   Problem: Skin Integrity: Goal: Will show signs of wound healing Outcome: Progressing

## 2024-01-02 NOTE — Progress Notes (Signed)
  Echocardiogram 2D Echocardiogram has been performed.  Koleen KANDICE Popper, RDCS 01/02/2024, 10:45 AM

## 2024-01-03 DIAGNOSIS — S72001A Fracture of unspecified part of neck of right femur, initial encounter for closed fracture: Secondary | ICD-10-CM | POA: Diagnosis not present

## 2024-01-03 LAB — GLUCOSE, CAPILLARY
Glucose-Capillary: 97 mg/dL (ref 70–99)
Glucose-Capillary: 114 mg/dL — ABNORMAL HIGH (ref 70–99)
Glucose-Capillary: 114 mg/dL — ABNORMAL HIGH (ref 70–99)
Glucose-Capillary: 115 mg/dL — ABNORMAL HIGH (ref 70–99)

## 2024-01-03 NOTE — Progress Notes (Signed)
 Orthopaedic Progress Note  S: Patient is resting comfortably in bed.  He was somewhat confused and poorly communicative  O:  Vitals:   01/03/24 0337 01/03/24 0737  BP: (!) 142/50 (!) 139/51  Pulse: 60 60  Resp: 16 18  Temp: 99.9 F (37.7 C) 98.6 F (37 C)  SpO2:  100%    Clean dressings on the right side.  Grossly moving the right lower extremity.  No focal tenderness to palpation in the calf.  Thigh soft and compressible    Labs:  Results for orders placed or performed during the hospital encounter of 12/31/23 (from the past 24 hours)  Glucose, capillary     Status: Abnormal   Collection Time: 01/02/24 11:54 AM  Result Value Ref Range   Glucose-Capillary 125 (H) 70 - 99 mg/dL  Glucose, capillary     Status: Abnormal   Collection Time: 01/02/24  4:47 PM  Result Value Ref Range   Glucose-Capillary 130 (H) 70 - 99 mg/dL  Glucose, capillary     Status: Abnormal   Collection Time: 01/02/24  9:02 PM  Result Value Ref Range   Glucose-Capillary 142 (H) 70 - 99 mg/dL  Glucose, capillary     Status: None   Collection Time: 01/03/24  6:28 AM  Result Value Ref Range   Glucose-Capillary 97 70 - 99 mg/dL    Assessment: Postop day 2 status post right total hip arthroplasty for fracture  Patient is doing well.  Continue with physical therapy.  Weight-bear as tolerated.  Continue Lovenox for DVT prophylaxis.  Continue with current pain and bowel regimen.  Can be discharged to rehab when appropriate with medicine and cleared from therapy.  She will follow-up with Dr. Kendal in 2 weeks time  Injuries: Right femoral neck fracture   Weightbearing: weightbearing as tolerated  Insicional and dressing care: Aquacel dressing  Pain management: Continue current pain regimen  VTE prophylaxis: Lovenox     Cordella Rhein, MD, MS [facility] [phone]

## 2024-01-03 NOTE — Evaluation (Signed)
 Occupational Therapy Evaluation Patient Details Name: Donna Becker MRN: [id number] DOB: [birth year] Today's Date: 01/03/2024   History of Present Illness   Donna Becker is an 78 y.o. female admitted 12/31/23 after ground level fall, sustaining a right displaced femoral neck fx. Pt s/p R THA 10/9. PMHx: CP with R hemiplegia, T2DM, HTN,  HFpEF, moderate aortic stenosis, arthritis, breast CA, and seizures.     Clinical Impressions Per chart review, Pt lives with her brother in a house and needed some help with ADLs/IADLs from her sister. Pt currently with impaired cognition and is an unreliable historian, unable to verify PLOF and home living information. Pt required Total A +2 for functional mobility this session. Pt sat EOB and was provided with Max A to wash face with washcloth. Pt required continuous Max support in sitting as Pt demonstrated posterior and left lateral lean. Pt found with urine in bed, requiring total A to change gown and bed pads. Pt provided with reorientation throughout session with no response to orientation attempts. Pt responded positively to continued verbal encouragement and explicit step-by-step instruction of tasks being completed. Pt is primarily limited by impaired cognition, pain in R leg, and decreased activity tolerance/endurance. OT will continue to follow acutely to facilitate progress towards functional goals. Patient will benefit from continued inpatient follow up therapy, <3 hours/day.      If plan is discharge home, recommend the following:   Two people to help with walking and/or transfers;Two people to help with bathing/dressing/bathroom;Assistance with cooking/housework;Assistance with feeding;Direct supervision/assist for medications management;Direct supervision/assist for financial management;Assist for transportation;Help with stairs or ramp for entrance;Supervision due to cognitive status     Functional Status Assessment   Patient has  had a recent decline in their functional status and demonstrates the ability to make significant improvements in function in a reasonable and predictable amount of time.     Equipment Recommendations   Other (comment) (defer until can confirm home living environment)     Recommendations for Other Services         Precautions/Restrictions   Precautions Precautions: Fall Recall of Precautions/Restrictions: Impaired Precaution/Restrictions Comments: Direct anterior approach, no hip precautions. Restrictions Weight Bearing Restrictions Per Provider Order: Yes RLE Weight Bearing Per Provider Order: Weight bearing as tolerated     Mobility Bed Mobility Overal bed mobility: Needs Assistance Bed Mobility: Rolling, Supine to Sit Rolling: Max assist   Supine to sit: Total assist, +2 for physical assistance, HOB elevated Sit to supine: Total assist, +2 for physical assistance   General bed mobility comments: Pt sat on L side of bed with increased time and total A. Pt required assistance to manage BLE and trunk. Pt unable to scoot hips forward. Pt required assist to lower trunk and bring BLE on bed for return to supine. Pt required Max A to roll and total A to reposition in bed using bed features and bed pad +2.    Transfers Overall transfer level: Needs assistance Equipment used: 2 person hand held assist Transfers: Sit to/from Stand Sit to Stand: Total assist, +2 physical assistance           General transfer comment: Pt attempted sit to stand transfer but was unable to clear bed d/t pain and fear of falling. Pt was provided with +2 HHA and was responsive to verbal encouragement and reassurance from therapist. Pt provided with explicit verbal cues for hand placement on staff. Pt requesting return to bed after first stand attempt.  Balance Overall balance assessment: Needs assistance Sitting-balance support: Bilateral upper extremity supported, Feet supported Sitting  balance-Leahy Scale: Zero Sitting balance - Comments: Pt required consistent Max A to sit EOB. Significant posterior and left lateral lean. Pt able to briefly bring body to midline with multimodal cues but unable to sustain longer than 5 seconds. Postural control: Posterior lean, Left lateral lean Standing balance support: Bilateral upper extremity supported, During functional activity Standing balance-Leahy Scale: Zero Standing balance comment: Pt unable to clear bed to stand.                           ADL either performed or assessed with clinical judgement   ADL Overall ADL's : Needs assistance/impaired Eating/Feeding: Total assistance Eating/Feeding Details (indicate cue type and reason): Pt requiring feeding assistance as she has safety mittens on. Grooming: Moderate assistance   Upper Body Bathing: Moderate assistance   Lower Body Bathing: Maximal assistance   Upper Body Dressing : Moderate assistance   Lower Body Dressing: Maximal assistance       Toileting- Clothing Manipulation and Hygiene: Total assistance       Functional mobility during ADLs: Total assistance;+2 for physical assistance;+2 for safety/equipment       Vision   Vision Assessment?: No apparent visual deficits Additional Comments: Pt able to visually attend to people as instructed     Perception         Praxis         Pertinent Vitals/Pain Pain Assessment Pain Assessment: PAINAD Breathing: occasional labored breathing, short period of hyperventilation Negative Vocalization: occasional moan/groan, low speech, negative/disapproving quality Facial Expression: facial grimacing Body Language: tense, distressed pacing, fidgeting Consolability: distracted or reassured by voice/touch PAINAD Score: 6 Pain Location: R hip Pain Descriptors / Indicators: Grimacing, Guarding, Moaning Pain Intervention(s): Limited activity within patient's tolerance, Monitored during session, Repositioned      Extremity/Trunk Assessment Upper Extremity Assessment Upper Extremity Assessment: RUE deficits/detail RUE Deficits / Details: Cerebral Palsy with R sided weakness RUE: Shoulder pain with ROM RUE Coordination: decreased fine motor;decreased gross motor   Lower Extremity Assessment Lower Extremity Assessment: Defer to PT evaluation   Cervical / Trunk Assessment Cervical / Trunk Assessment: Kyphotic   Communication Communication Communication: No apparent difficulties   Cognition Arousal: Alert Behavior During Therapy: Anxious Cognition: Cognition impaired   Orientation impairments: Place, Time, Situation Awareness: Intellectual awareness impaired, Online awareness impaired     Executive functioning impairment (select all impairments): Initiation, Organization, Reasoning, Sequencing, Problem solving OT - Cognition Comments: Pt AxO to self only. Pt responsive to knowing what will happen prior to movement and step-by-step cues. Pt unable to problem-solve and reason. Benefitted from verbal encouragement and reassurance.                 Following commands: Impaired Following commands impaired: Follows one step commands inconsistently     Cueing  General Comments   Cueing Techniques: Verbal cues;Tactile cues;Visual cues  Pt provided with reorientation during session with minimal response. OT utilized therapeutic use of self to establish positive professional relationship to facilitate engagement in occupations.   Exercises     Shoulder Instructions      Home Living Family/patient expects to be discharged to:: Private residence Living Arrangements: Other relatives (brother) Available Help at Discharge: Family Type of Home: House Home Access: Stairs to enter                     Home  Equipment: Agricultural consultant (2 wheels)   Additional Comments: Information gathered per chart review. Pt is unreliable historian and unable to verify information. Pt asked who  she lived with and stated my mom and dad.      Prior Functioning/Environment Prior Level of Function : Patient poor historian/Family not available             Mobility Comments: Per chart review, pt ambulates using RW. At least 1 fall leading to current admission. ADLs Comments: Per chart review, Pt needs help with some ADLs and IADLs which is provided by her sister who visits her frequently.    OT Problem List: Decreased strength;Decreased range of motion;Decreased activity tolerance;Impaired balance (sitting and/or standing);Decreased cognition;Decreased safety awareness;Decreased knowledge of precautions;Impaired UE functional use;Pain   OT Treatment/Interventions: Self-care/ADL training;Therapeutic exercise;DME and/or AE instruction;Therapeutic activities;Cognitive remediation/compensation;Patient/family education;Balance training      OT Goals(Current goals can be found in the care plan section)   Acute Rehab OT Goals Patient Stated Goal: To get warm OT Goal Formulation: Patient unable to participate in goal setting Time For Goal Achievement: 01/17/24 Potential to Achieve Goals: Good ADL Goals Pt Will Perform Grooming: with min assist;sitting Pt Will Transfer to Toilet: with mod assist;bedside commode Additional ADL Goal #1: Pt will tolerate sitting EOB with Min A. for at least 10 minutes to address decreased activity tolerance. Additional ADL Goal #2: Pt will engage in bed mobility with Min A.   OT Frequency:  Min 2X/week    Co-evaluation              AM-PAC OT 6 Clicks Daily Activity     Outcome Measure Help from another person eating meals?: Total Help from another person taking care of personal grooming?: A Lot Help from another person toileting, which includes using toliet, bedpan, or urinal?: Total Help from another person bathing (including washing, rinsing, drying)?: A Lot Help from another person to put on and taking off regular upper body  clothing?: A Lot Help from another person to put on and taking off regular lower body clothing?: Total 6 Click Score: 9   End of Session Equipment Utilized During Treatment: Gait belt Nurse Communication: Mobility status;Other (comment) (Pt food in room requiring assistance to feed)  Activity Tolerance: Patient limited by pain Patient left: in bed;with call bell/phone within reach;with bed alarm set  OT Visit Diagnosis: Unsteadiness on feet (R26.81);Muscle weakness (generalized) (M62.81);History of falling (Z91.81);Pain;Other symptoms and signs involving cognitive function Pain - Right/Left: Right Pain - part of body: Leg                Time: 1135-1155 OT Time Calculation (min): 20 min Charges:  OT General Charges $OT Visit: 1 Visit OT Evaluation $OT Eval Moderate Complexity: 1 Mod  Donna Becker, OTR/L.  [facility]  Office: [phone]   Donna PARAS Lanecia Sliva 01/03/2024, 1:01 PM

## 2024-01-03 NOTE — Progress Notes (Signed)
 PROGRESS NOTE  Donna Becker FMW:[id number] DOB: 14-Aug-1945 DOA: 12/31/2023 PCP: Rexanne Ingle, MD   LOS: 3 days   Brief Narrative / Interim history: 78 year old female with HTN, HLD, DM2, seizure disorder, cerebral palsy with right-sided weakness, moderate AS who comes into the hospital after a fall and right hip pain.  She denies any loss of consciousness.  Imaging in the ED showed right femoral neck fracture, orthopedic surgery was consulted and she was admitted to the hospital  Subjective / 24h Interval events: Awake, complains of hip pain but cannot remember why.  No shortness of breath  Assesement and Plan: Principal problem Displaced right femoral neck fracture - Patient presented to the hospital with right femoral neck fracture status post fall.  Orthopedic surgery consulted, appreciate input, status post right THA on 10/10.  Continue Lovenox for DVT prophylaxis - PT recommends SNF  Active problems CHF exacerbation - Pt with mild CHF exacerbation, slightly elevated BNP, received Lasix  x 1 on admission.  She is comfortable on room air, hold further diuresis for now - Echo shows normal LVEF 65 to 65%, no WMA, RV was normal  Acute metabolic encephalopathy -has been having confusion while here, family does not report any history of dementia and usually she is alert and oriented and appropriate.  Suspected due to pain medications, monitor postoperatively  AKI versus CKD - Patient with creatinine elevation which may be secondary to an AKI versus CKD.  It is a difficult to differentiate as patient's last creatinine is over 1 year ago. - Creatinine remained stable around 1.5, suspect has an underlying degree of CKD  Hypertension -continue hydralazine , Coreg , nifedipine , blood pressure better  Hyperlipidemia - Restart home statin  Type 2 diabetes - Repeat A1c at 1.5.  Placed on SSI.  Lymphedema - left sided lymphedema.  Upper extremity Doppler negative for DVT  Gout - Holding  home allupurinol given AKI. Re-dose if needed based on creatinine if pt has CKD.   Scheduled Meds:  carbamazepine   200 mg Oral TID   carvedilol   25 mg Oral BID WC   cycloSPORINE   1 drop Both Eyes BID   docusate sodium  100 mg Oral BID   enoxaparin (LOVENOX) injection  40 mg Subcutaneous Q24H   hydrALAZINE   100 mg Oral BID   insulin aspart  0-6 Units Subcutaneous TID WC   NIFEdipine   90 mg Oral Daily   pravastatin   20 mg Oral Daily   Vitamin D (Ergocalciferol)  50,000 Units Oral Q Fri   Continuous Infusions:   PRN Meds:.acetaminophen **OR** acetaminophen, diphenhydrAMINE, HYDROmorphone (DILAUDID) injection, magnesium citrate, menthol **OR** phenol, methocarbamol **OR** methocarbamol (ROBAXIN) injection, metoCLOPramide **OR** metoCLOPramide (REGLAN) injection, ondansetron  **OR** ondansetron  (ZOFRAN ) IV, oxyCODONE, senna-docusate  Current Outpatient Medications  Medication Instructions   allopurinol  (ZYLOPRIM ) 100 MG tablet Take 1 tablet (100 mg total) by mouth in the morning for gout.   B Complex-C (B-COMPLEX WITH VITAMIN C) tablet 1 tablet, Daily   bimatoprost (LUMIGAN) 0.01 % SOLN 1 drop, Daily at bedtime   bisacodyl  (DULCOLAX) 5 MG EC tablet Take by mouth as directed per md.   carbamazepine  (TEGRETOL ) 200 MG tablet Take 1 tablet (200 mg total) by mouth 3 (three) times daily for seizure.   carvedilol  (COREG ) 25 MG tablet TAKE ONE TABLET BY MOUTH TWICE DAILY Needs appointment for further refills   carvedilol  (COREG ) 25 MG tablet Take 1 tablet (25 mg total) by mouth 2 (two) times daily with food.   ciprofloxacin  (CIPRO ) 250 mg, Oral, Every  12 hours   colchicine 0.6 mg, Daily   cycloSPORINE  (RESTASIS ) 0.05 % ophthalmic emulsion 1 drop, Both Eyes, 2 times daily   furosemide  (LASIX ) 20 mg, Oral, Daily   gabapentin  (NEURONTIN ) 300 MG capsule Take 1 capsule (300 mg total) by mouth at night.   hydrALAZINE  (APRESOLINE ) 100 MG tablet Take 1 tablet (100 mg total) by mouth 2 (two) times daily,  once in the morning and once at noon.   hydrALAZINE  (APRESOLINE ) 100 mg, 2 times daily   Lifitegrast  (XIIDRA ) 5 % SOLN Instill 1 drop into both eyes twice a day   losartan (COZAAR) 100 mg, Every evening   Multiple Vitamin (MULTIVITAMIN WITH MINERALS) TABS tablet 1 tablet, Daily   NIFEdipine  (ADALAT  CC) 90 MG 24 hr tablet Take 1 tablet (90 mg total) by mouth daily on an empty stomach.   NIFEdipine  (ADALAT  CC) 90 mg, Oral, Daily   polyethylene glycol (GOLYTELY) 236 g solution Take as directed per doctor instructions.   pravastatin  (PRAVACHOL ) 20 mg, Oral, Daily   Probiotic Product (PROBIOTIC DAILY) CAPS 1 capsule, Daily    Diet Orders (From admission, onward)     Start     Ordered   01/01/24 1722  Diet Heart Room service appropriate? Yes; Fluid consistency: Thin  Diet effective now       Question Answer Comment  Room service appropriate? Yes   Fluid consistency: Thin      01/01/24 1721            DVT prophylaxis: enoxaparin (LOVENOX) injection 40 mg Start: 01/02/24 0800 SCDs Start: 01/01/24 1722 SCDs Start: 12/31/23 2005   Lab Results  Component Value Date   PLT 223 01/02/2024      Code Status: Full Code  Family Communication: No family at bedside  Status is: Inpatient Remains inpatient appropriate because: Severity of illness   Level of care: Telemetry Cardiac  Consultants:  Orthopedic surgery  Objective: Vitals:   01/02/24 1300 01/02/24 1930 01/03/24 0337 01/03/24 0737  BP: (!) 147/54 (!) 148/67 (!) 142/50 (!) 139/51  Pulse: 65 69 60 60  Resp: 16 13 16 18   Temp: 98.3 F (36.8 C) 98.2 F (36.8 C) 99.9 F (37.7 C) 98.6 F (37 C)  TempSrc:    Oral  SpO2: 100%   100%  Height:        Intake/Output Summary (Last 24 hours) at 01/03/2024 1121 Last data filed at 01/02/2024 2111 Gross per 24 hour  Intake --  Output 200 ml  Net -200 ml   Wt Readings from Last 3 Encounters:  06/02/23 59 kg  04/22/23 60 kg  11/28/22 60.4 kg     Examination: Constitutional: NAD Eyes: lids and conjunctivae normal, no scleral icterus ENMT: mmm Neck: normal, supple Respiratory: clear to auscultation bilaterally, no wheezing, no crackles. Cardiovascular: Regular rate and rhythm, no murmurs / rubs / gallops. No LE edema. Abdomen: soft, no distention, no tenderness. Bowel sounds positive.   Data Reviewed: I have independently reviewed following labs and imaging studies   CBC Recent Labs  Lab 12/31/23 1731 01/01/24 0615 01/01/24 1811 01/02/24 0440  WBC 8.4 6.1 11.5* 7.0  HGB 11.2* 11.9* 10.9* 8.9*  HCT 34.7* 36.2 33.6* 27.2*  PLT 283 298 254 223  MCV 96.1 94.0 95.7 94.4  MCH 31.0 30.9 31.1 30.9  MCHC 32.3 32.9 32.4 32.7  RDW 14.2 14.3 14.4 14.3  LYMPHSABS 1.2  --   --   --   MONOABS 0.3  --   --   --  EOSABS 0.1  --   --   --   BASOSABS 0.0  --   --   --     Recent Labs  Lab 12/31/23 1731 12/31/23 1926 12/31/23 2220 01/01/24 0615 01/01/24 0951 01/01/24 1811 01/02/24 0440  NA 137  --   --  139  --   --  139  K 4.5  --   --  4.2  --   --  3.9  CL 101  --   --  100  --   --  103  CO2 21*  --   --  23  --   --  21*  GLUCOSE 143*  --   --  118*  --   --  102*  BUN 73*  --   --  71*  --   --  66*  CREATININE 1.53*  --   --  1.64*  --  1.52* 1.57*  CALCIUM 9.3  --   --  9.7  --   --  8.8*  AST 39  --   --   --   --   --  75*  ALT 33  --   --   --   --   --  41  ALKPHOS 133*  --   --   --   --   --  107  BILITOT 0.5  --   --   --   --   --  0.4  ALBUMIN 3.9  --   --   --   --   --  3.3*  MG  --   --  2.2  --   --   --  2.0  HGBA1C  --   --   --   --  5.5  --   --   BNP  --  477.2*  --   --   --   --   --     ------------------------------------------------------------------------------------------------------------------ No results for input(s): CHOL, HDL, LDLCALC, TRIG, CHOLHDL, LDLDIRECT in the last 72 hours.  Lab Results  Component Value Date   HGBA1C 5.5 01/01/2024    ------------------------------------------------------------------------------------------------------------------ No results for input(s): TSH, T4TOTAL, T3FREE, THYROIDAB in the last 72 hours.  Invalid input(s): FREET3  Cardiac Enzymes No results for input(s): CKMB, TROPONINI, MYOGLOBIN in the last 168 hours.  Invalid input(s): CK ------------------------------------------------------------------------------------------------------------------    Component Value Date/Time   BNP 477.2 (H) 12/31/2023 1926    CBG: Recent Labs  Lab 01/02/24 0634 01/02/24 1154 01/02/24 1647 01/02/24 2102 01/03/24 0628  GLUCAP 122* 125* 130* 142* 97    Recent Results (from the past 240 hours)  Surgical pcr screen     Status: None   Collection Time: 01/01/24  8:01 AM   Specimen: Nasal Mucosa; Nasal Swab  Result Value Ref Range Status   MRSA, PCR NEGATIVE NEGATIVE Final   Staphylococcus aureus NEGATIVE NEGATIVE Final    Comment: (NOTE) The Xpert SA Assay (FDA approved for NASAL specimens in patients 11 years of age and older), is one component of a comprehensive surveillance program. It is not intended to diagnose infection nor to guide or monitor treatment. Performed at [facility], [address], [place], [place] [address]      Radiology Studies: VAS US  UPPER EXTREMITY VENOUS DUPLEX Result Date: 01/02/2024 UPPER VENOUS STUDY  Patient Name:  ALEKSIS JIGGETTS  Date of Exam:   01/02/2024 Medical Rec #: [id number]  Accession #:    [id number] Date of Birth: [birth year]          Patient Gender: F Patient Age:   45 years Exam Location:  [facility] Procedure:      VAS US  UPPER EXTREMITY VENOUS DUPLEX Referring Phys: [facility] --------------------------------------------------------------------------------  Indications: Swelling Risk Factors: Left breast cancer with retained lymphedema since the 1990's. Limitations: Poor ultrasound/tissue interface and  musculoskeletal features. Comparison Study: Prior negative left UEV done 06/21/2010 Performing Technologist: Alberta Lis RVS  Examination Guidelines: A complete evaluation includes B-mode imaging, spectral Doppler, color Doppler, and power Doppler as needed of all accessible portions of each vessel. Bilateral testing is considered an integral part of a complete examination. Limited examinations for reoccurring indications may be performed as noted.  Right Findings: +----------+------------+---------+-----------+----------+-------+ RIGHT     CompressiblePhasicitySpontaneousPropertiesSummary +----------+------------+---------+-----------+----------+-------+ Subclavian               Yes       Yes                      +----------+------------+---------+-----------+----------+-------+  Left Findings: +----------+------------+---------+-----------+----------+-------+ LEFT      CompressiblePhasicitySpontaneousPropertiesSummary +----------+------------+---------+-----------+----------+-------+ IJV           Full       Yes       Yes                      +----------+------------+---------+-----------+----------+-------+ Subclavian               Yes       Yes                      +----------+------------+---------+-----------+----------+-------+ Axillary      Full       Yes       Yes                      +----------+------------+---------+-----------+----------+-------+ Brachial      Full       Yes       Yes                      +----------+------------+---------+-----------+----------+-------+ Radial        Full                                          +----------+------------+---------+-----------+----------+-------+ Ulnar         Full       Yes                                +----------+------------+---------+-----------+----------+-------+ Cephalic                 Yes       Yes                       +----------+------------+---------+-----------+----------+-------+ Basilic                  Yes       Yes                      +----------+------------+---------+-----------+----------+-------+  Summary:  Right: No evidence of thrombosis in the subclavian.  Left: No evidence of deep vein thrombosis in the upper extremity. No evidence  of superficial vein thrombosis in the upper extremity.  *See table(s) above for measurements and observations.  Diagnosing physician: Gaile New MD Electronically signed by Gaile New MD on 01/02/2024 at 9:57:52 PM.    Final      Nilda Fendt, MD, PhD [facility]  Between 7 am - 7 pm I am available, please contact me via Amion (for emergencies) or Securechat (non urgent messages)  Between 7 pm - 7 am I am not available, please contact night coverage MD/APP via Amion

## 2024-01-04 DIAGNOSIS — S72001A Fracture of unspecified part of neck of right femur, initial encounter for closed fracture: Secondary | ICD-10-CM | POA: Diagnosis not present

## 2024-01-04 LAB — GLUCOSE, CAPILLARY
Glucose-Capillary: 100 mg/dL — ABNORMAL HIGH (ref 70–99)
Glucose-Capillary: 95 mg/dL (ref 70–99)
Glucose-Capillary: 206 mg/dL — ABNORMAL HIGH (ref 70–99)
Glucose-Capillary: 143 mg/dL — ABNORMAL HIGH (ref 70–99)

## 2024-01-04 LAB — COMPREHENSIVE METABOLIC PANEL WITH GFR
ALT: 43 U/L (ref 0–44)
AST: 93 U/L — ABNORMAL HIGH (ref 15–41)
Albumin: 2.8 g/dL — ABNORMAL LOW (ref 3.5–5.0)
Alkaline Phosphatase: 140 U/L — ABNORMAL HIGH (ref 38–126)
Anion gap: 10 (ref 5–15)
BUN: 65 mg/dL — ABNORMAL HIGH (ref 8–23)
CO2: 23 mmol/L (ref 22–32)
Calcium: 9 mg/dL (ref 8.9–10.3)
Chloride: 105 mmol/L (ref 98–111)
Creatinine, Ser: 1.33 mg/dL — ABNORMAL HIGH (ref 0.44–1.00)
GFR, Estimated: 41 mL/min — ABNORMAL LOW (ref >60)
Glucose, Bld: 114 mg/dL — ABNORMAL HIGH (ref 70–99)
Potassium: 3.8 mmol/L (ref 3.5–5.1)
Sodium: 138 mmol/L (ref 135–145)
Total Bilirubin: 0.7 mg/dL (ref 0.0–1.2)
Total Protein: 6.9 g/dL (ref 6.5–8.1)

## 2024-01-04 LAB — CBC
HCT: 24.7 % — ABNORMAL LOW (ref 36.0–46.0)
Hemoglobin: 8.1 g/dL — ABNORMAL LOW (ref 12.0–15.0)
MCH: 31.3 pg (ref 26.0–34.0)
MCHC: 32.8 g/dL (ref 30.0–36.0)
MCV: 95.4 fL (ref 80.0–100.0)
Platelets: 192 K/uL (ref 150–400)
RBC: 2.59 MIL/uL — ABNORMAL LOW (ref 3.87–5.11)
RDW: 14.4 % (ref 11.5–15.5)
WBC: 6.6 K/uL (ref 4.0–10.5)
nRBC: 0 % (ref 0.0–0.2)

## 2024-01-04 LAB — MAGNESIUM: Magnesium: 2.3 mg/dL (ref 1.7–2.4)

## 2024-01-04 NOTE — TOC CAGE-AID Note (Signed)
 Transition of Care [facility]) - CAGE-AID Screening   Patient Details  Name: Donna Becker MRN: [id number] Date of Birth: 06-Nov-1945  [facility] of [facility] CM/SW Contact:    Cena Ligas, LCSW Phone Number: 01/04/2024, 4:32 PM   Clinical Narrative:  CSW met with pt at bedside. Pt was unable to participate in assessment due to not being oriented x4. CSW asked pts sister Shawnee. Shawnee denies that pt  drinks alcohol or uses any substances. No other interventions needed at this time.   CAGE-AID Screening: Substance Abuse Screening unable to be completed due to: : Patient unable to participate             Substance Abuse Education Offered: No

## 2024-01-04 NOTE — Progress Notes (Signed)
 PROGRESS NOTE  Donna Becker FMW:[id number] DOB: 06/18/1945 DOA: 12/31/2023 PCP: Rexanne Ingle, MD   LOS: 4 days   Brief Narrative / Interim history: 78 year old female with HTN, HLD, DM2, seizure disorder, cerebral palsy with right-sided weakness, moderate AS who comes into the hospital after a fall and right hip pain.  She denies any loss of consciousness.  Imaging in the ED showed right femoral neck fracture, orthopedic surgery was consulted and she was admitted to the hospital  Subjective / 24h Interval events: Awake, much more alert today.  She knows she is in the hospital and knows why.  Assesement and Plan: Principal problem Displaced right femoral neck fracture - Patient presented to the hospital with right femoral neck fracture status post fall.  Orthopedic surgery consulted, appreciate input, status post right THA on 10/10.  Continue Lovenox for DVT prophylaxis - PT recommends SNF, placement pending  Active problems CHF exacerbation - Pt with mild CHF exacerbation, slightly elevated BNP, received Lasix  x 1 on admission.  He is comfortable on room air - Echo shows normal LVEF 65 to 65%, no WMA, RV was normal  Acute metabolic encephalopathy -has been having confusion while here, family does not report any history of dementia and usually she is alert and oriented and appropriate.  Suspected due to pain medications, monitor postoperatively - Mental status much improved today, suspect close to baseline  AKI versus CKD - Patient with creatinine elevation which may be secondary to an AKI versus CKD.  It is a difficult to differentiate as patient's last creatinine is over 1 year ago. - Creatinine remained stable, 1.3 today, suspect an ongoing degree of CKD  Hypertension -continue hydralazine , Coreg , nifedipine , blood pressure is stable  Hyperlipidemia - Restart home statin  Type 2 diabetes - Repeat A1c at 1.5.  Placed on SSI.  CBGs reviewed and acceptable  CBG (last 3)   Recent Labs    01/03/24 1645 01/03/24 2108 01/04/24 0608  GLUCAP 114* 115* 100*    Lymphedema - left sided lymphedema.  Upper extremity Doppler negative for DVT  Gout - Holding home allupurinol given AKI. Re-dose if needed based on creatinine if pt has CKD.   Scheduled Meds:  carbamazepine   200 mg Oral TID   carvedilol   25 mg Oral BID WC   cycloSPORINE   1 drop Both Eyes BID   docusate sodium  100 mg Oral BID   enoxaparin (LOVENOX) injection  40 mg Subcutaneous Q24H   hydrALAZINE   100 mg Oral BID   insulin aspart  0-6 Units Subcutaneous TID WC   NIFEdipine   90 mg Oral Daily   pravastatin   20 mg Oral Daily   Vitamin D (Ergocalciferol)  50,000 Units Oral Q Fri   Continuous Infusions:   PRN Meds:.acetaminophen **OR** acetaminophen, diphenhydrAMINE, HYDROmorphone (DILAUDID) injection, magnesium citrate, menthol **OR** phenol, methocarbamol **OR** methocarbamol (ROBAXIN) injection, metoCLOPramide **OR** metoCLOPramide (REGLAN) injection, ondansetron  **OR** ondansetron  (ZOFRAN ) IV, oxyCODONE, senna-docusate  Current Outpatient Medications  Medication Instructions   allopurinol  (ZYLOPRIM ) 100 MG tablet Take 1 tablet (100 mg total) by mouth in the morning for gout.   B Complex-C (B-COMPLEX WITH VITAMIN C) tablet 1 tablet, Daily   bimatoprost (LUMIGAN) 0.01 % SOLN 1 drop, Daily at bedtime   bisacodyl  (DULCOLAX) 5 MG EC tablet Take by mouth as directed per md.   carbamazepine  (TEGRETOL ) 200 MG tablet Take 1 tablet (200 mg total) by mouth 3 (three) times daily for seizure.   carvedilol  (COREG ) 25 MG tablet TAKE ONE TABLET BY  MOUTH TWICE DAILY Needs appointment for further refills   carvedilol  (COREG ) 25 MG tablet Take 1 tablet (25 mg total) by mouth 2 (two) times daily with food.   ciprofloxacin  (CIPRO ) 250 mg, Oral, Every 12 hours   colchicine 0.6 mg, Daily   cycloSPORINE  (RESTASIS ) 0.05 % ophthalmic emulsion 1 drop, Both Eyes, 2 times daily   furosemide  (LASIX ) 20 mg, Oral, Daily    gabapentin  (NEURONTIN ) 300 MG capsule Take 1 capsule (300 mg total) by mouth at night.   hydrALAZINE  (APRESOLINE ) 100 MG tablet Take 1 tablet (100 mg total) by mouth 2 (two) times daily, once in the morning and once at noon.   hydrALAZINE  (APRESOLINE ) 100 mg, 2 times daily   Lifitegrast  (XIIDRA ) 5 % SOLN Instill 1 drop into both eyes twice a day   losartan (COZAAR) 100 mg, Every evening   Multiple Vitamin (MULTIVITAMIN WITH MINERALS) TABS tablet 1 tablet, Daily   NIFEdipine  (ADALAT  CC) 90 MG 24 hr tablet Take 1 tablet (90 mg total) by mouth daily on an empty stomach.   NIFEdipine  (ADALAT  CC) 90 mg, Oral, Daily   polyethylene glycol (GOLYTELY) 236 g solution Take as directed per doctor instructions.   pravastatin  (PRAVACHOL ) 20 mg, Oral, Daily   Probiotic Product (PROBIOTIC DAILY) CAPS 1 capsule, Daily    Diet Orders (From admission, onward)     Start     Ordered   01/01/24 1722  Diet Heart Room service appropriate? Yes; Fluid consistency: Thin  Diet effective now       Question Answer Comment  Room service appropriate? Yes   Fluid consistency: Thin      01/01/24 1721            DVT prophylaxis: enoxaparin (LOVENOX) injection 40 mg Start: 01/02/24 0800 SCDs Start: 01/01/24 1722 SCDs Start: 12/31/23 2005   Lab Results  Component Value Date   PLT 192 01/04/2024      Code Status: Full Code  Family Communication: No family at bedside  Status is: Inpatient Remains inpatient appropriate because: Severity of illness   Level of care: Telemetry Cardiac  Consultants:  Orthopedic surgery  Objective: Vitals:   01/03/24 1424 01/03/24 2000 01/04/24 0401 01/04/24 0732  BP: (!) 133/50 (!) 128/48 (!) 127/49 (!) 133/49  Pulse: 62 60 60 66  Resp: 19 16  19   Temp: 99.5 F (37.5 C) 99.6 F (37.6 C) 98.1 F (36.7 C) 99.2 F (37.3 C)  TempSrc: Oral Oral Oral Oral  SpO2: 100% 100% 100% 98%  Height:        Intake/Output Summary (Last 24 hours) at 01/04/2024 1025 Last data  filed at 01/04/2024 0120 Gross per 24 hour  Intake 120 ml  Output --  Net 120 ml   Wt Readings from Last 3 Encounters:  06/02/23 59 kg  04/22/23 60 kg  11/28/22 60.4 kg    Examination: Constitutional: NAD Eyes: lids and conjunctivae normal, no scleral icterus ENMT: mmm Neck: normal, supple Respiratory: clear to auscultation bilaterally, no wheezing, no crackles. Cardiovascular: Regular rate and rhythm, no murmurs / rubs / gallops. No LE edema. Abdomen: soft, no distention, no tenderness. Bowel sounds positive.   Data Reviewed: I have independently reviewed following labs and imaging studies   CBC Recent Labs  Lab 12/31/23 1731 01/01/24 0615 01/01/24 1811 01/02/24 0440 01/04/24 0308  WBC 8.4 6.1 11.5* 7.0 6.6  HGB 11.2* 11.9* 10.9* 8.9* 8.1*  HCT 34.7* 36.2 33.6* 27.2* 24.7*  PLT 283 298 254 223 192  MCV  96.1 94.0 95.7 94.4 95.4  MCH 31.0 30.9 31.1 30.9 31.3  MCHC 32.3 32.9 32.4 32.7 32.8  RDW 14.2 14.3 14.4 14.3 14.4  LYMPHSABS 1.2  --   --   --   --   MONOABS 0.3  --   --   --   --   EOSABS 0.1  --   --   --   --   BASOSABS 0.0  --   --   --   --     Recent Labs  Lab 12/31/23 1731 12/31/23 1926 12/31/23 2220 01/01/24 0615 01/01/24 0951 01/01/24 1811 01/02/24 0440 01/04/24 0308  NA 137  --   --  139  --   --  139 138  K 4.5  --   --  4.2  --   --  3.9 3.8  CL 101  --   --  100  --   --  103 105  CO2 21*  --   --  23  --   --  21* 23  GLUCOSE 143*  --   --  118*  --   --  102* 114*  BUN 73*  --   --  71*  --   --  66* 65*  CREATININE 1.53*  --   --  1.64*  --  1.52* 1.57* 1.33*  CALCIUM 9.3  --   --  9.7  --   --  8.8* 9.0  AST 39  --   --   --   --   --  75* 93*  ALT 33  --   --   --   --   --  41 43  ALKPHOS 133*  --   --   --   --   --  107 140*  BILITOT 0.5  --   --   --   --   --  0.4 0.7  ALBUMIN 3.9  --   --   --   --   --  3.3* 2.8*  MG  --   --  2.2  --   --   --  2.0 2.3  HGBA1C  --   --   --   --  5.5  --   --   --   BNP  --  477.2*  --    --   --   --   --   --     ------------------------------------------------------------------------------------------------------------------ No results for input(s): CHOL, HDL, LDLCALC, TRIG, CHOLHDL, LDLDIRECT in the last 72 hours.  Lab Results  Component Value Date   HGBA1C 5.5 01/01/2024   ------------------------------------------------------------------------------------------------------------------ No results for input(s): TSH, T4TOTAL, T3FREE, THYROIDAB in the last 72 hours.  Invalid input(s): FREET3  Cardiac Enzymes No results for input(s): CKMB, TROPONINI, MYOGLOBIN in the last 168 hours.  Invalid input(s): CK ------------------------------------------------------------------------------------------------------------------    Component Value Date/Time   BNP 477.2 (H) 12/31/2023 1926    CBG: Recent Labs  Lab 01/03/24 0628 01/03/24 1156 01/03/24 1645 01/03/24 2108 01/04/24 0608  GLUCAP 97 114* 114* 115* 100*    Recent Results (from the past 240 hours)  Surgical pcr screen     Status: None   Collection Time: 01/01/24  8:01 AM   Specimen: Nasal Mucosa; Nasal Swab  Result Value Ref Range Status   MRSA, PCR NEGATIVE NEGATIVE Final   Staphylococcus aureus NEGATIVE NEGATIVE Final    Comment: (NOTE) The Xpert SA Assay (FDA approved for NASAL specimens in patients  68 years of age and older), is one component of a comprehensive surveillance program. It is not intended to diagnose infection nor to guide or monitor treatment. Performed at [facility], [address] [place], [place] [address]      Radiology Studies: No results found.    Nilda Fendt, MD, PhD [facility]  Between 7 am - 7 pm I am available, please contact me via Amion (for emergencies) or Securechat (non urgent messages)  Between 7 pm - 7 am I am not available, please contact night coverage MD/APP via Amion

## 2024-01-04 NOTE — TOC Initial Note (Addendum)
 Transition of Care [facility]) - Initial/Assessment Note    Patient Details  Name: Donna Becker MRN: [id number] Date of Birth: May 29, 1945  [facility]) CM/SW Contact:    Cena Ligas, LCSW Phone Number: 01/04/2024, 4:26 PM  Clinical Narrative:                  CSW received SNF consult. CSW met with pt at bedside, pt did not seem oriented to answer questions. CSW spoke to pts sister and POA Shawnee. CSW introduced self and explained role at the hospital. Shawnee reports that PTA she lives at home with her brother Curtistine, and her sister Rudolph checks on her daily to make sure she has eaten and taking her medications. Rudolph also assists with some ADLs such as bathing and meal prep. Pt used a walker for mobility.  CSW reviewed PT/OT recommendations for SNF.Shawnee reports she is unsure about SNF and [facility]. Shawnee did give CSW permission to fax out to facilities in the area as a back up. Pt has no preference of facility at this time. CSW gave pt medicare.gov rating list to review. CSW explained insurance auth process.  CSW will continue to follow.   Expected Discharge Plan: Skilled Nursing Facility Barriers to Discharge: Continued Medical Work up   Patient Goals and CMS Choice Patient states their goals for this hospitalization and ongoing recovery are:: to get stronger CMS Medicare.gov Compare Post Acute Care list provided to:: Patient Represenative (must comment) Choice offered to / list presented to : Sibling      Expected Discharge Plan and Services       Living arrangements for the past 2 months: Single Family Home                                      Prior Living Arrangements/Services Living arrangements for the past 2 months: Single Family Home Lives with:: Siblings Patient language and need for interpreter reviewed:: Yes Do you feel safe going back to the place where you live?: Yes      Need for Family Participation in  Patient Care: Yes (Comment) Care giver support system in place?: Yes (comment)   Criminal Activity/Legal Involvement Pertinent to Current Situation/Hospitalization: No - Comment as needed  Activities of Daily Living      Permission Sought/Granted Permission sought to share information with : Facility Medical sales representative, Family Supports Permission granted to share information with : Yes, Verbal Permission Granted  Share Information with NAME: Sister - Shawnee Gosling- [phone]  Permission granted to share info w AGENCY: SNF        Emotional Assessment Appearance:: Appears stated age Attitude/Demeanor/Rapport: Engaged Affect (typically observed): Appropriate Orientation: : Oriented to Self, Oriented to Place Alcohol / Substance Use: Never Used Psych Involvement: No (comment)  Admission diagnosis:  Fall, initial encounter [W19.XXXA] Closed fracture of right hip, initial encounter (HCC) [S72.001A] Displaced fracture of neck of right femur (HCC) [S72.001A] Patient Active Problem List   Diagnosis Date Noted   Displaced fracture of neck of right femur (HCC) 12/31/2023   Essential hypertension 12/31/2023   Hyperlipidemia 12/31/2023   T2DM (type 2 diabetes mellitus) (HCC) 12/31/2023   Lymphedema of arm 12/31/2023   Hx of adenomatous colonic polyps 04/22/2023   Pain due to onychomycosis of toenails of both feet 12/19/2020   Clavi 12/19/2020   Iron deficiency anemia 09/29/2019  Heme positive stool 09/29/2019   PCP:  Rexanne Ingle, MD Pharmacy:   [facility] [address] [place] [address] Phone: [phone] Fax: [phone]     Social Drivers of Health (SDOH) Social History: SDOH Screenings   Food Insecurity: Patient Unable To Answer (01/01/2024)  Housing: Patient Unable To Answer (01/01/2024)  Tobacco Use: Medium Risk (01/01/2024)   SDOH Interventions:     Readmission Risk Interventions     No data to display          Cena Ligas, LCSW Clinical Social Worker

## 2024-01-05 ENCOUNTER — Ambulatory Visit: Admitting: Neurology

## 2024-01-05 DIAGNOSIS — S72001A Fracture of unspecified part of neck of right femur, initial encounter for closed fracture: Secondary | ICD-10-CM | POA: Diagnosis not present

## 2024-01-05 LAB — GLUCOSE, CAPILLARY
Glucose-Capillary: 130 mg/dL — ABNORMAL HIGH (ref 70–99)
Glucose-Capillary: 87 mg/dL (ref 70–99)
Glucose-Capillary: 118 mg/dL — ABNORMAL HIGH (ref 70–99)
Glucose-Capillary: 165 mg/dL — ABNORMAL HIGH (ref 70–99)

## 2024-01-05 LAB — URINALYSIS, ROUTINE W REFLEX MICROSCOPIC
Bilirubin Urine: NEGATIVE
Glucose, UA: NEGATIVE mg/dL
Hgb urine dipstick: NEGATIVE
Ketones, ur: NEGATIVE mg/dL
Leukocytes,Ua: NEGATIVE
Nitrite: NEGATIVE
Protein, ur: 30 mg/dL — AB
Specific Gravity, Urine: 1.015 (ref 1.005–1.030)
pH: 5 (ref 5.0–8.0)

## 2024-01-05 LAB — CBC
HCT: 27 % — ABNORMAL LOW (ref 36.0–46.0)
Hemoglobin: 8.7 g/dL — ABNORMAL LOW (ref 12.0–15.0)
MCH: 31.2 pg (ref 26.0–34.0)
MCHC: 32.2 g/dL (ref 30.0–36.0)
MCV: 96.8 fL (ref 80.0–100.0)
Platelets: 247 K/uL (ref 150–400)
RBC: 2.79 MIL/uL — ABNORMAL LOW (ref 3.87–5.11)
RDW: 14.4 % (ref 11.5–15.5)
WBC: 6.8 K/uL (ref 4.0–10.5)
nRBC: 0 % (ref 0.0–0.2)

## 2024-01-05 LAB — COMPREHENSIVE METABOLIC PANEL WITH GFR
ALT: 42 U/L (ref 0–44)
AST: 64 U/L — ABNORMAL HIGH (ref 15–41)
Albumin: 3 g/dL — ABNORMAL LOW (ref 3.5–5.0)
Alkaline Phosphatase: 163 U/L — ABNORMAL HIGH (ref 38–126)
Anion gap: 13 (ref 5–15)
BUN: 65 mg/dL — ABNORMAL HIGH (ref 8–23)
CO2: 22 mmol/L (ref 22–32)
Calcium: 9.5 mg/dL (ref 8.9–10.3)
Chloride: 109 mmol/L (ref 98–111)
Creatinine, Ser: 1.21 mg/dL — ABNORMAL HIGH (ref 0.44–1.00)
GFR, Estimated: 46 mL/min — ABNORMAL LOW (ref >60)
Glucose, Bld: 129 mg/dL — ABNORMAL HIGH (ref 70–99)
Potassium: 3.9 mmol/L (ref 3.5–5.1)
Sodium: 144 mmol/L (ref 135–145)
Total Bilirubin: 0.6 mg/dL (ref 0.0–1.2)
Total Protein: 7.5 g/dL (ref 6.5–8.1)

## 2024-01-05 LAB — MAGNESIUM: Magnesium: 2.4 mg/dL (ref 1.7–2.4)

## 2024-01-05 MED ORDER — OXYCODONE HCL 5 MG PO TABS: 2.5000 mg | ORAL_TABLET | ORAL | 0 refills | Status: AC | PRN

## 2024-01-05 MED ORDER — ASPIRIN 325 MG PO TBEC: 325.0000 mg | DELAYED_RELEASE_TABLET | Freq: Every day | ORAL | 0 refills | Status: AC

## 2024-01-05 MED ORDER — METHOCARBAMOL 500 MG PO TABS: 500.0000 mg | ORAL_TABLET | Freq: Four times a day (QID) | ORAL | 0 refills | Status: AC | PRN

## 2024-01-05 MED ORDER — VITAMIN D (ERGOCALCIFEROL) 1.25 MG (50000 UNIT) PO CAPS: 50000.0000 [IU] | ORAL_CAPSULE | ORAL | 0 refills | Status: AC

## 2024-01-05 NOTE — Care Management Important Message (Signed)
 Important Message  Patient Details  Name: Donna Becker MRN: [id number] Date of Birth: [birth year]   Important Message Given:  Yes - Medicare IM     Jon Cruel 01/05/2024, 4:43 PM

## 2024-01-05 NOTE — Discharge Instructions (Signed)
 Franky Light, MD Lauraine Moores PA-C [facility] [address] [place], [place] [address] [phone] (tel)   [phone] (fax)   TOTAL HIP REPLACEMENT POSTOPERATIVE DIRECTIONS    Hip Rehabilitation, Guidelines Following Surgery   WEIGHT BEARING Weight bearing as tolerated with assist device (walker, cane, etc) as directed, use it as long as suggested by your surgeon or therapist, typically at least 4-6 weeks.  The results of a hip operation are greatly improved after range of motion and muscle strengthening exercises. Follow all safety measures which are given to protect your hip. If any of these exercises cause increased pain or swelling in your joint, decrease the amount until you are comfortable again. Then slowly increase the exercises. Call your caregiver if you have problems or questions.   HOME CARE INSTRUCTIONS  Most of the following instructions are designed to prevent the dislocation of your new hip.  Remove items at home which could result in a fall. This includes throw rugs or furniture in walking pathways.  Continue medications as instructed at time of discharge. You may have some home medications which will be placed on hold until you complete the course of blood thinner medication. You may start showering once you are discharged home. Do not remove your dressing. Do not put on socks or shoes without following the instructions of your caregivers.   Sit on chairs with arms. Use the chair arms to help push yourself up when arising.  Arrange for the use of a toilet seat elevator so you are not sitting low.  Walk with walker as instructed.  You may resume a sexual relationship in one month or when given the OK by your caregiver.  Use walker as long as suggested by your caregivers.  You may put full weight on your legs and walk as much as is comfortable. Avoid periods of inactivity such as sitting longer than an hour when not asleep. This helps  prevent blood clots.  You may return to work once you are cleared by Designer, industrial/product.  Do not drive a car for 6 weeks or until released by your surgeon.  Do not drive while taking narcotics.  Wear elastic stockings for two weeks following surgery during the day but you may remove then at night.  Make sure you keep all of your appointments after your operation with all of your doctors and caregivers. You should call the office at the above phone number and make an appointment for approximately two weeks after the date of your surgery. Please pick up a stool softener and laxative for home use as long as you are requiring pain medications. ICE to the affected hip every three hours for 30 minutes at a time and then as needed for pain and swelling. Continue to use ice on the hip for pain and swelling from surgery. You may notice swelling that will progress down to the foot and ankle.  This is normal after surgery.  Elevate the leg when you are not up walking on it.   It is important for you to complete the blood thinner medication as prescribed by your doctor. Continue to use the breathing machine which will help keep your temperature down.  It is common for your temperature to cycle up and down following surgery, especially at night when you are not up moving around and exerting yourself.  The breathing machine keeps your lungs expanded and your temperature down.  RANGE OF MOTION AND STRENGTHENING EXERCISES  These exercises  are designed to help you keep full movement of your hip joint. Follow your caregiver's or physical therapist's instructions. Perform all exercises about fifteen times, three times per day or as directed. Exercise both hips, even if you have had only one joint replacement. These exercises can be done on a training (exercise) mat, on the floor, on a table or on a bed. Use whatever works the best and is most comfortable for you. Use music or television while you are exercising so that the  exercises are a pleasant break in your day. This will make your life better with the exercises acting as a break in routine you can look forward to.  Lying on your back, slowly slide your foot toward your buttocks, raising your knee up off the floor. Then slowly slide your foot back down until your leg is straight again.  Lying on your back spread your legs as far apart as you can without causing discomfort.  Lying on your side, raise your upper leg and foot straight up from the floor as far as is comfortable. Slowly lower the leg and repeat.  Lying on your back, tighten up the muscle in the front of your thigh (quadriceps muscles). You can do this by keeping your leg straight and trying to raise your heel off the floor. This helps strengthen the largest muscle supporting your knee.  Lying on your back, tighten up the muscles of your buttocks both with the legs straight and with the knee bent at a comfortable angle while keeping your heel on the floor.   SKILLED REHAB INSTRUCTIONS: If the patient is transferred to a skilled rehab facility following release from the hospital, a list of the current medications will be sent to the facility for the patient to continue.  When discharged from the skilled rehab facility, please have the facility set up the patient's Home Health Physical Therapy prior to being released. Also, the skilled facility will be responsible for providing the patient with their medications at time of release from the facility to include their pain medication and their blood thinner medication. If the patient is still at the rehab facility at time of the two week follow up appointment, the skilled rehab facility will also need to assist the patient in arranging follow up appointment in our office and any transportation needs.  POST-OPERATIVE OPIOID TAPER INSTRUCTIONS: It is important to wean off of your opioid medication as soon as possible. If you do not need pain medication after your  surgery it is ok to stop day one. Opioids include: Codeine, Hydrocodone(Norco, Vicodin), Oxycodone(Percocet, oxycontin) and hydromorphone amongst others.  Long term and even short term use of opiods can cause: Increased pain response Dependence Constipation Depression Respiratory depression And more.  Withdrawal symptoms can include Flu like symptoms Nausea, vomiting And more Techniques to manage these symptoms Hydrate well Eat regular healthy meals Stay active Use relaxation techniques(deep breathing, meditating, yoga) Do Not substitute Alcohol to help with tapering If you have been on opioids for less than two weeks and do not have pain than it is ok to stop all together.  Plan to wean off of opioids This plan should start within one week post op of your joint replacement. Maintain the same interval or time between taking each dose and first decrease the dose.  Cut the total daily intake of opioids by one tablet each day Next start to increase the time between doses. The last dose that should be eliminated is the evening  dose.    MAKE SURE YOU:  Understand these instructions.  Will watch your condition.  Will get help right away if you are not doing well or get worse.  Pick up stool softner and laxative for home use following surgery while on pain medications. Do not remove your dressing. The dressing is waterproof--it is OK to take showers. Continue to use ice for pain and swelling after surgery. Do not use any lotions or creams on the incision until instructed by your surgeon. Total Hip Protocol.

## 2024-01-05 NOTE — TOC Progression Note (Addendum)
 Transition of Care [facility]) - Progression Note    Patient Details  Name: Donna Becker MRN: [id number] Date of Birth: 08/25/1945  Transition of Care [facility]) CM/SW Contact  [name], LCSW Phone Number: 01/05/2024, 10:10 AM  Clinical Narrative:   Message sent to CIR regarding family request for CIR.     1010: FL2 completed and referral sent out in hub for SNF.  1600: PASSR received: [id number] A. Bed offers provided to pt with medicare choice document, and sister Theoplis (on speakerphone) they will review.   Expected Discharge Plan: Skilled Nursing Facility Barriers to Discharge: Continued Medical Work up               Expected Discharge Plan and Services       Living arrangements for the past 2 months: Single Family Home                                       Social Drivers of Health (SDOH) Interventions SDOH Screenings   Food Insecurity: Patient Unable To Answer (01/01/2024)  Housing: Patient Unable To Answer (01/01/2024)  Tobacco Use: Medium Risk (01/01/2024)    Readmission Risk Interventions     No data to display

## 2024-01-05 NOTE — Progress Notes (Signed)
 Inpatient Rehab Admissions Coordinator:   Case reviewed and patient does not meet criteria for CIR at this time. Insurance will not approve. Other discharge dispositions will need to be pursued.  Rehab Admissons Coordinator Vianney Kopecky, [place], [place] [phone]

## 2024-01-05 NOTE — Progress Notes (Addendum)
 PROGRESS NOTE  Donna Becker FMW:[id number] DOB: [birth year] DOA: 12/31/2023 PCP: Rexanne Ingle, MD   LOS: 5 days   Brief Narrative / Interim history: 78 year old female with HTN, HLD, DM2, seizure disorder, cerebral palsy with right-sided weakness, moderate AS who comes into the hospital after a fall and right hip pain.  She denies any loss of consciousness.  Imaging in the ED showed right femoral neck fracture, orthopedic surgery was consulted and she was admitted to the hospital  Subjective / 24h Interval events: Eating breakfast, complains of hip pain but controlled  Assesement and Plan: Principal problem Displaced right femoral neck fracture - Patient presented to the hospital with right femoral neck fracture status post fall.  Orthopedic surgery consulted, appreciate input, status post right THA on 10/10.  Continue Lovenox for DVT prophylaxis - PT recommends SNF, placement pending  Active problems CHF exacerbation - Pt with mild CHF exacerbation, slightly elevated BNP, received Lasix  x 1 on admission.  He is comfortable on room air - Echo shows normal LVEF 65 to 65%, no WMA, RV was normal  Acute metabolic encephalopathy -has been having confusion while here, family does not report any history of dementia and usually she is alert and oriented and appropriate.  Suspected due to pain medications, monitor postoperatively - Mental status is better  AKI versus CKD - Patient with creatinine elevation which may be secondary to an AKI versus CKD.  It is a difficult to differentiate as patient's last creatinine is over 1 year ago. 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  Placed on SSI.  BG's are acceptable  CBG (last 3)  Recent Labs    01/04/24 1612 01/04/24 2115 01/05/24 0737  GLUCAP 206* 143* 130*     Lymphedema - left sided lymphedema.  Upper extremity Doppler negative for DVT  Gout - Holding home allupurinol given AKI. Re-dose if needed based on creatinine if pt has CKD.   Scheduled Meds:  carbamazepine   200 mg Oral TID   carvedilol   25 mg Oral BID WC   cycloSPORINE   1 drop Both Eyes BID   docusate sodium  100 mg Oral BID   enoxaparin (LOVENOX) injection  40 mg Subcutaneous Q24H   hydrALAZINE   100 mg Oral BID   insulin aspart  0-6 Units Subcutaneous TID WC   NIFEdipine   90 mg Oral Daily   pravastatin   20 mg Oral Daily   Vitamin D (Ergocalciferol)  50,000 Units Oral Q Fri   Continuous Infusions:   PRN Meds:.acetaminophen **OR** acetaminophen, diphenhydrAMINE, HYDROmorphone (DILAUDID) injection, magnesium citrate, menthol **OR** phenol, methocarbamol **OR** methocarbamol (ROBAXIN) injection, metoCLOPramide **OR** metoCLOPramide (REGLAN) injection, ondansetron  **OR** ondansetron  (ZOFRAN ) IV, oxyCODONE, senna-docusate  Current Outpatient Medications  Medication Instructions   allopurinol  (ZYLOPRIM ) 100 MG tablet Take 1 tablet (100 mg total) by mouth in the morning for gout.   aspirin EC 325 mg, Oral, Daily   B Complex-C (B-COMPLEX WITH VITAMIN C) tablet 1 tablet, Daily   bimatoprost (LUMIGAN) 0.01 % SOLN 1 drop, Daily at bedtime   bisacodyl  (DULCOLAX) 5 MG EC tablet Take by mouth as directed per md.   carbamazepine  (TEGRETOL ) 200 MG tablet Take 1 tablet (200 mg total) by mouth 3 (three) times daily for seizure.   carvedilol  (COREG ) 25 MG tablet TAKE ONE TABLET BY MOUTH TWICE DAILY Needs appointment for further refills  carvedilol  (COREG ) 25 MG tablet Take 1 tablet (25 mg total) by mouth 2 (two) times daily with food.   ciprofloxacin  (CIPRO ) 250 mg, Oral, Every 12 hours   colchicine 0.6 mg, Daily   cycloSPORINE  (RESTASIS ) 0.05 % ophthalmic emulsion 1 drop, Both Eyes, 2 times daily   furosemide  (LASIX ) 20 mg, Oral, Daily   gabapentin  (NEURONTIN ) 300 MG capsule Take 1 capsule  (300 mg total) by mouth at night.   hydrALAZINE  (APRESOLINE ) 100 MG tablet Take 1 tablet (100 mg total) by mouth 2 (two) times daily, once in the morning and once at noon.   hydrALAZINE  (APRESOLINE ) 100 mg, 2 times daily   Lifitegrast  (XIIDRA ) 5 % SOLN Instill 1 drop into both eyes twice a day   losartan (COZAAR) 100 mg, Every evening   methocarbamol (ROBAXIN) 500 mg, Oral, Every 6 hours PRN   Multiple Vitamin (MULTIVITAMIN WITH MINERALS) TABS tablet 1 tablet, Daily   NIFEdipine  (ADALAT  CC) 90 MG 24 hr tablet Take 1 tablet (90 mg total) by mouth daily on an empty stomach.   NIFEdipine  (ADALAT  CC) 90 mg, Oral, Daily   oxyCODONE (OXY IR/ROXICODONE) 2.5-5 mg, Oral, Every 4 hours PRN   polyethylene glycol (GOLYTELY) 236 g solution Take as directed per doctor instructions.   pravastatin  (PRAVACHOL ) 20 mg, Oral, Daily   Probiotic Product (PROBIOTIC DAILY) CAPS 1 capsule, Daily   [START ON 01/09/2024] Vitamin D (Ergocalciferol) (DRISDOL) 50,000 Units, Oral, Every Fri    Diet Orders (From admission, onward)     Start     Ordered   01/01/24 1722  Diet Heart Room service appropriate? Yes; Fluid consistency: Thin  Diet effective now       Question Answer Comment  Room service appropriate? Yes   Fluid consistency: Thin      01/01/24 1721            DVT prophylaxis: enoxaparin (LOVENOX) injection 40 mg Start: 01/02/24 0800 SCDs Start: 01/01/24 1722 SCDs Start: 12/31/23 2005   Lab Results  Component Value Date   PLT 247 01/05/2024      Code Status: Full Code  Family Communication: No family at bedside  Status is: Inpatient Remains inpatient appropriate because: Severity of illness   Level of care: Telemetry Cardiac  Consultants:  Orthopedic surgery  Objective: Vitals:   01/04/24 2009 01/04/24 2228 01/05/24 0419 01/05/24 0737  BP: (!) 137/58 (!) 158/55 (!) 160/56 (!) 145/59  Pulse: 71 69 70 83  Resp: 15  19 16   Temp: 99.2 F (37.3 C)  98.6 F (37 C) 97.7 F (36.5 C)   TempSrc: Oral  Oral   SpO2: 99%  100% 99%  Height:        Intake/Output Summary (Last 24 hours) at 01/05/2024 1107 Last data filed at 01/05/2024 0700 Gross per 24 hour  Intake 240 ml  Output --  Net 240 ml   Wt Readings from Last 3 Encounters:  06/02/23 59 kg  04/22/23 60 kg  11/28/22 60.4 kg    Examination: Constitutional: NAD Eyes: lids and conjunctivae normal, no scleral icterus ENMT: mmm Neck: normal, supple Respiratory: clear to auscultation bilaterally, no wheezing, no crackles. Cardiovascular: Regular rate and rhythm, no murmurs / rubs / gallops. No LE edema. Abdomen: soft, no distention, no tenderness. Bowel sounds positive.   Data Reviewed: I have independently reviewed following labs and imaging studies   CBC Recent Labs  Lab 12/31/23 1731 01/01/24 0615 01/01/24 1811 01/02/24 0440 01/04/24 0308 01/05/24 0640  WBC 8.4 6.1  11.5* 7.0 6.6 6.8  HGB 11.2* 11.9* 10.9* 8.9* 8.1* 8.7*  HCT 34.7* 36.2 33.6* 27.2* 24.7* 27.0*  PLT 283 298 254 223 192 247  MCV 96.1 94.0 95.7 94.4 95.4 96.8  MCH 31.0 30.9 31.1 30.9 31.3 31.2  MCHC 32.3 32.9 32.4 32.7 32.8 32.2  RDW 14.2 14.3 14.4 14.3 14.4 14.4  LYMPHSABS 1.2  --   --   --   --   --   MONOABS 0.3  --   --   --   --   --   EOSABS 0.1  --   --   --   --   --   BASOSABS 0.0  --   --   --   --   --     Recent Labs  Lab 12/31/23 1731 12/31/23 1926 12/31/23 2220 01/01/24 0615 01/01/24 0951 01/01/24 1811 01/02/24 0440 01/04/24 0308 01/05/24 0640  NA 137  --   --  139  --   --  139 138 144  K 4.5  --   --  4.2  --   --  3.9 3.8 3.9  CL 101  --   --  100  --   --  103 105 109  CO2 21*  --   --  23  --   --  21* 23 22  GLUCOSE 143*  --   --  118*  --   --  102* 114* 129*  BUN 73*  --   --  71*  --   --  66* 65* 65*  CREATININE 1.53*  --   --  1.64*  --  1.52* 1.57* 1.33* 1.21*  CALCIUM 9.3  --   --  9.7  --   --  8.8* 9.0 9.5  AST 39  --   --   --   --   --  75* 93* 64*  ALT 33  --   --   --   --   --  41  43 42  ALKPHOS 133*  --   --   --   --   --  107 140* 163*  BILITOT 0.5  --   --   --   --   --  0.4 0.7 0.6  ALBUMIN 3.9  --   --   --   --   --  3.3* 2.8* 3.0*  MG  --   --  2.2  --   --   --  2.0 2.3 2.4  HGBA1C  --   --   --   --  5.5  --   --   --   --   BNP  --  477.2*  --   --   --   --   --   --   --     ------------------------------------------------------------------------------------------------------------------ No results for input(s): CHOL, HDL, LDLCALC, TRIG, CHOLHDL, LDLDIRECT in the last 72 hours.  Lab Results  Component Value Date   HGBA1C 5.5 01/01/2024   ------------------------------------------------------------------------------------------------------------------ No results for input(s): TSH, T4TOTAL, T3FREE, THYROIDAB in the last 72 hours.  Invalid input(s): FREET3  Cardiac Enzymes No results for input(s): CKMB, TROPONINI, MYOGLOBIN in the last 168 hours.  Invalid input(s): CK ------------------------------------------------------------------------------------------------------------------    Component Value Date/Time   BNP 477.2 (H) 12/31/2023 1926    CBG: Recent Labs  Lab 01/04/24 0608 01/04/24 1134 01/04/24 1612 01/04/24 2115 01/05/24 0737  GLUCAP 100* 95 206* 143* 130*  Recent Results (from the past 240 hours)  Surgical pcr screen     Status: None   Collection Time: 01/01/24  8:01 AM   Specimen: Nasal Mucosa; Nasal Swab  Result Value Ref Range Status   MRSA, PCR NEGATIVE NEGATIVE Final   Staphylococcus aureus NEGATIVE NEGATIVE Final    Comment: (NOTE) The Xpert SA Assay (FDA approved for NASAL specimens in patients 65 years of age and older), is one component of a comprehensive surveillance program. It is not intended to diagnose infection nor to guide or monitor treatment. Performed at [facility], [address] [place], [place] [address]      Radiology Studies: No results  found.    Nilda Fendt, MD, PhD [facility]  Between 7 am - 7 pm I am available, please contact me via Amion (for emergencies) or Securechat (non urgent messages)  Between 7 pm - 7 am I am not available, please contact night coverage MD/APP via Amion

## 2024-01-05 NOTE — NC FL2 (Signed)
 [place]  MEDICAID FL2 LEVEL OF CARE FORM     IDENTIFICATION  Patient Name: Donna Becker Birthdate: 1945/10/02 Sex: female Admission Date (Current Location): 12/31/2023  [facility] and [place] Number:  Producer, television/film/video and Address:  [facility], [address], [place], [place] [address]      Provider Number: [id number]  Attending Physician Name and Address:  Trixie Nilda HERO, MD  Relative Name and Phone Number:  Donna Becker [phone]    Current Level of Care: Hospital Recommended Level of Care: Skilled Nursing Facility Prior Approval Number:    Date Approved/Denied:   PASRR Number:    Discharge Plan: SNF    Current Diagnoses: Patient Active Problem List   Diagnosis Date Noted   Displaced fracture of neck of right femur (HCC) 12/31/2023   Essential hypertension 12/31/2023   Hyperlipidemia 12/31/2023   T2DM (type 2 diabetes mellitus) (HCC) 12/31/2023   Lymphedema of arm 12/31/2023   Hx of adenomatous colonic polyps 04/22/2023   Pain due to onychomycosis of toenails of both feet 12/19/2020   Clavi 12/19/2020   Iron deficiency anemia 09/29/2019   Heme positive stool 09/29/2019    Orientation RESPIRATION BLADDER Height & Weight     Self, Situation, Place  Normal Incontinent Weight:   Height:  5' 5 (165.1 cm)  BEHAVIORAL SYMPTOMS/MOOD NEUROLOGICAL BOWEL NUTRITION STATUS        Diet (see discharge summary)  AMBULATORY STATUS COMMUNICATION OF NEEDS Skin   Total Care Verbally Surgical wounds, Other (Comment) (redness)                       Personal Care Assistance Level of Assistance  Bathing, Feeding, Dressing, Total care Bathing Assistance: Maximum assistance Feeding assistance: Maximum assistance Dressing Assistance: Maximum assistance Total Care Assistance: Maximum assistance   Functional Limitations Info  Sight, Hearing, Speech Sight Info: Adequate Hearing Info: Adequate Speech Info: Adequate    SPECIAL  CARE FACTORS FREQUENCY  PT (By licensed PT), OT (By licensed OT)     PT Frequency: 5x weej OT Frequency: 5x week            Contractures Contractures Info: Not present    Additional Factors Info  Code Status, Allergies, Insulin Sliding Scale Code Status Info: full Allergies Info: codeine   Insulin Sliding Scale Info: Novolog: see discharge summary       Current Medications (01/05/2024):  This is the current hospital active medication list Current Facility-Administered Medications  Medication Dose Route Frequency Provider Last Rate Last Admin   acetaminophen (TYLENOL) tablet 650 mg  650 mg Oral Q6H PRN Danton Lauraine LABOR, PA-C   650 mg at 01/03/24 9780   Or   acetaminophen (TYLENOL) suppository 650 mg  650 mg Rectal Q6H PRN Danton Lauraine LABOR, PA-C       carbamazepine  (TEGRETOL ) tablet 200 mg  200 mg Oral TID Danton Lauraine A, PA-C   200 mg at 01/05/24 9157   carvedilol  (COREG ) tablet 25 mg  25 mg Oral BID WC Danton Lauraine A, PA-C   25 mg at 01/05/24 9158   cycloSPORINE  (RESTASIS ) 0.05 % ophthalmic emulsion 1 drop  1 drop Both Eyes BID Danton Lauraine LABOR, PA-C   1 drop at 01/05/24 0842   diphenhydrAMINE (BENADRYL) 12.5 MG/5ML elixir 12.5-25 mg  12.5-25 mg Oral Q4H PRN Danton Lauraine LABOR, PA-C       docusate sodium (COLACE) capsule 100 mg  100 mg Oral BID Danton Lauraine LABOR,  PA-C   100 mg at 01/05/24 0841   enoxaparin (LOVENOX) injection 40 mg  40 mg Subcutaneous Q24H Danton Lauraine LABOR, PA-C   40 mg at 01/05/24 0842   hydrALAZINE  (APRESOLINE ) tablet 100 mg  100 mg Oral BID Danton Lauraine LABOR, PA-C   100 mg at 01/05/24 9158   HYDROmorphone (DILAUDID) injection 0.5 mg  0.5 mg Intravenous Q4H PRN Danton Lauraine LABOR, PA-C   0.5 mg at 01/02/24 1728   insulin aspart (novoLOG) injection 0-6 Units  0-6 Units Subcutaneous TID WC Danton Lauraine LABOR, PA-C   2 Units at 01/04/24 1742   magnesium citrate solution 1 Bottle  1 Bottle Oral Once PRN Danton Lauraine LABOR, PA-C       menthol (CEPACOL) lozenge 3 mg  1  lozenge Oral PRN Danton Lauraine LABOR, PA-C       Or   phenol (CHLORASEPTIC) mouth spray 1 spray  1 spray Mouth/Throat PRN Danton Lauraine LABOR, PA-C       methocarbamol (ROBAXIN) tablet 500 mg  500 mg Oral Q6H PRN Danton Lauraine LABOR, PA-C   500 mg at 01/04/24 9497   Or   methocarbamol (ROBAXIN) injection 500 mg  500 mg Intravenous Q6H PRN Danton Lauraine LABOR, PA-C       metoCLOPramide (REGLAN) tablet 5-10 mg  5-10 mg Oral Q8H PRN Danton, Sarah A, PA-C       Or   metoCLOPramide (REGLAN) injection 5-10 mg  5-10 mg Intravenous Q8H PRN McClung, Sarah A, PA-C       NIFEdipine  (PROCARDIA -XL/NIFEDICAL-XL) 24 hr tablet 90 mg  90 mg Oral Daily Danton Lauraine LABOR, PA-C   90 mg at 01/04/24 9076   ondansetron  (ZOFRAN ) tablet 4 mg  4 mg Oral Q6H PRN Danton Lauraine LABOR, PA-C       Or   ondansetron  (ZOFRAN ) injection 4 mg  4 mg Intravenous Q6H PRN Danton Lauraine LABOR, PA-C       oxyCODONE (Oxy IR/ROXICODONE) immediate release tablet 2.5-5 mg  2.5-5 mg Oral Q4H PRN Danton Lauraine LABOR, PA-C   5 mg at 01/05/24 9441   pravastatin  (PRAVACHOL ) tablet 20 mg  20 mg Oral Daily Danton Lauraine LABOR, PA-C   20 mg at 01/05/24 9158   senna-docusate (Senokot-S) tablet 1 tablet  1 tablet Oral QHS PRN Danton Lauraine LABOR, PA-C       Vitamin D (Ergocalciferol) (DRISDOL) 1.25 MG (50000 UNIT) capsule 50,000 Units  50,000 Units Oral Q Fri Danton Lauraine LABOR, PA-C   50,000 Units at 01/02/24 9063     Discharge Medications: Please see discharge summary for a list of discharge medications.  Relevant Imaging Results:  Relevant Lab Results:   Additional Information SSN: [id number]  Donna Cordella Simmonds, LCSW

## 2024-01-06 DIAGNOSIS — S72001A Fracture of unspecified part of neck of right femur, initial encounter for closed fracture: Secondary | ICD-10-CM | POA: Diagnosis not present

## 2024-01-06 LAB — GLUCOSE, CAPILLARY
Glucose-Capillary: 116 mg/dL — ABNORMAL HIGH (ref 70–99)
Glucose-Capillary: 134 mg/dL — ABNORMAL HIGH (ref 70–99)
Glucose-Capillary: 136 mg/dL — ABNORMAL HIGH (ref 70–99)
Glucose-Capillary: 143 mg/dL — ABNORMAL HIGH (ref 70–99)

## 2024-01-06 NOTE — Progress Notes (Addendum)
 Physical Therapy Treatment Patient Details Name: Donna Becker MRN: [id number] DOB: April 30, 1945 Today's Date: 01/06/2024   History of Present Illness Donna Becker is an 78 y.o. female admitted 12/31/23 after ground level fall, sustaining a right displaced femoral neck fx. Pt s/p R THA 10/9. PMHx: CP with R hemiplegia, T2DM, HTN,  HFpEF, moderate aortic stenosis, arthritis, breast CA, and seizures.    PT Comments  Pt resting in bed on arrival, pleasant and agreeable to session. Pt demonstrating continued progress towards acute goals this session, able to follow all commands, and demonstrating greatly improved sitting balance and tolerance with pt able to maintain sitting balance with min A fading to CGA. Pt requiring mod A to complete supine>sit and max A to return Les to bed at end of session due to fatigue. Pt able to come to partial stand x3 during session with max A to boost from slightly elevated EOB and RW for support, however pt unable to bring COG over COM despite blocking at bil feet to prevent them sliding out to come to full upright standing. Patient will benefit from continued inpatient follow up therapy, <3 hours/day, will continue to follow acutely.    If plan is discharge home, recommend the following: Two people to help with walking and/or transfers;Two people to help with bathing/dressing/bathroom;Assistance with cooking/housework;Assist for transportation;Help with stairs or ramp for entrance;Supervision due to cognitive status   Can travel by private vehicle     No  Equipment Recommendations  Hospital bed;Hoyer lift;Wheelchair (measurements PT);Wheelchair cushion (measurements PT);BSC/3in1    Recommendations for Other Services       Precautions / Restrictions Precautions Precautions: Fall Recall of Precautions/Restrictions: Impaired Precaution/Restrictions Comments: Direct anterior approach, no hip precautions. Restrictions Weight Bearing Restrictions Per Provider  Order: Yes RLE Weight Bearing Per Provider Order: Weight bearing as tolerated     Mobility  Bed Mobility Overal bed mobility: Needs Assistance Bed Mobility: Supine to Sit, Sit to Supine     Supine to sit: HOB elevated, Mod assist, Used rails Sit to supine: Max assist   General bed mobility comments: pt exiting L side of bed, able to sequence LEs to EOB without cues, mod A to elevate trunk to sitting and for cues to utilize L bedrail to manage trunk, mod A to elevate trunk to sitting    Transfers Overall transfer level: Needs assistance Equipment used: Rolling walker (2 wheels) Transfers: Sit to/from Stand Sit to Stand: Max assist, From elevated surface           General transfer comment: max A to eleavte hips from sitting surface and for blocking LLE, able to elevate hips but unable to come to full upright standing    Ambulation/Gait               General Gait Details: unable   Stairs             Wheelchair Mobility     Tilt Bed    Modified Rankin (Stroke Patients Only)       Balance Overall balance assessment: Needs assistance Sitting-balance support: Bilateral upper extremity supported, Feet supported Sitting balance-Leahy Scale: Fair Sitting balance - Comments: min A initially due to L lateral lean due to pain in R hip, fading to close CGA for safety Postural control: Posterior lean, Left lateral lean Standing balance support: Bilateral upper extremity supported, During functional activity Standing balance-Leahy Scale: Zero Standing balance comment: Pt unable to clear bed to stand.  Communication Communication Communication: No apparent difficulties  Cognition Arousal: Alert Behavior During Therapy: Anxious   PT - Cognitive impairments: History of cognitive impairments, No family/caregiver present to determine baseline (Hx of CP)                       PT - Cognition Comments: disoriented to  place, with prompts able to recall fall and suergery Following commands: Impaired Following commands impaired: Follows one step commands inconsistently    Cueing Cueing Techniques: Verbal cues, Tactile cues, Visual cues  Exercises      General Comments General comments (skin integrity, edema, etc.): VSS on RA      Pertinent Vitals/Pain Pain Assessment Pain Assessment: Faces Faces Pain Scale: Hurts little more Pain Location: R hip Pain Descriptors / Indicators: Grimacing, Guarding, Moaning Pain Intervention(s): Monitored during session, Limited activity within patient's tolerance    Home Living                          Prior Function            PT Goals (current goals can now be found in the care plan section) Acute Rehab PT Goals PT Goal Formulation: Patient unable to participate in goal setting Time For Goal Achievement: 01/16/24 Progress towards PT goals: Progressing toward goals    Frequency    Min 2X/week      PT Plan      Co-evaluation              AM-PAC PT 6 Clicks Mobility   Outcome Measure  Help needed turning from your back to your side while in a flat bed without using bedrails?: Total Help needed moving from lying on your back to sitting on the side of a flat bed without using bedrails?: Total Help needed moving to and from a bed to a chair (including a wheelchair)?: Total Help needed standing up from a chair using your arms (e.g., wheelchair or bedside chair)?: Total Help needed to walk in hospital room?: Total Help needed climbing 3-5 steps with a railing? : Total 6 Click Score: 6    End of Session   Activity Tolerance: Patient limited by pain;Patient tolerated treatment well Patient left: in bed;with call bell/phone within reach;with bed alarm set Nurse Communication: Mobility status PT Visit Diagnosis: Pain;Other abnormalities of gait and mobility (R26.89);Difficulty in walking, not elsewhere classified  (R26.2);Unsteadiness on feet (R26.81) Pain - Right/Left: Right Pain - part of body: Hip     Time: 8657-8596 PT Time Calculation (min) (ACUTE ONLY): 21 min  Charges:    $Therapeutic Activity: 8-22 mins PT General Charges $$ ACUTE PT VISIT: 1 Visit                     Evangela Heffler R. PTA Acute Rehabilitation Services Office: [phone]   Therisa CHRISTELLA Boor 01/06/2024, 2:08 PM

## 2024-01-06 NOTE — Plan of Care (Signed)
  Problem: Coping: Goal: Ability to adjust to condition or change in health will improve Outcome: Progressing   Problem: Metabolic: Goal: Ability to maintain appropriate glucose levels will improve Outcome: Progressing   Problem: Nutritional: Goal: Maintenance of adequate nutrition will improve Outcome: Progressing Goal: Progress toward achieving an optimal weight will improve Outcome: Progressing   Problem: Activity: Goal: Risk for activity intolerance will decrease Outcome: Progressing   Problem: Nutrition: Goal: Adequate nutrition will be maintained Outcome: Progressing   Problem: Pain Managment: Goal: General experience of comfort will improve and/or be controlled Outcome: Progressing   Problem: Safety: Goal: Ability to remain free from injury will improve Outcome: Progressing   Problem: Skin Integrity: Goal: Risk for impaired skin integrity will decrease Outcome: Progressing

## 2024-01-06 NOTE — Progress Notes (Signed)
 Ok to leave out IV per Dr. Trixie.

## 2024-01-06 NOTE — TOC Progression Note (Addendum)
 Transition of Care [facility]) - Progression Note    Patient Details  Name: Donna Becker MRN: [id number] Date of Birth: September 07, 1945  [facility]) CM/SW Contact  [name], LCSW Phone Number: 01/06/2024, 10:01 AM  Clinical Narrative:   CSW message from pt sister Donna Becker: would like to accept offer at [facility]  CSW confirmed with [facility]: they do not have available bed today could receive tomorrow.    1430: SNF auth request submitted and approved: [id number], 3 days: 10/15-10/17.   Expected Discharge Plan: Skilled Nursing Facility Barriers to Discharge: Continued Medical Work up               Expected Discharge Plan and Services       Living arrangements for the past 2 months: Single Family Home                                       Social Drivers of Health (SDOH) Interventions SDOH Screenings   Food Insecurity: Patient Unable To Answer (01/01/2024)  Housing: Patient Unable To Answer (01/01/2024)  Tobacco Use: Medium Risk (01/01/2024)    Readmission Risk Interventions     No data to display

## 2024-01-06 NOTE — Progress Notes (Signed)
 PROGRESS NOTE  Donna Becker FMW:[id number] DOB: 02/06/1946 DOA: 12/31/2023 PCP: Rexanne Ingle, MD   LOS: 6 days   Brief Narrative / Interim history: 78 year old female with HTN, HLD, DM2, seizure disorder, cerebral palsy with right-sided weakness, moderate AS who comes into the hospital after a fall and right hip pain.  She denies any loss of consciousness.  Imaging in the ED showed right femoral neck fracture, orthopedic surgery was consulted and she was admitted to the hospital  Subjective / 24h Interval events: No significant pain, eating breakfast.  Forgot again why she is here and why her hip is hurting  Assesement and Plan: Principal problem Displaced right femoral neck fracture - Patient presented to the hospital with right femoral neck fracture status post fall.  Orthopedic surgery consulted, appreciate input, status post right THA on 10/10.  Continue Lovenox for DVT prophylaxis - PT recommends SNF, placement pending, discussed with SW, insurance authorization pending, and facility will not have a bed until tomorrow regardless  Active problems CHF exacerbation - Pt with mild CHF exacerbation, slightly elevated BNP, received Lasix  x 1 on admission.  She remains comfortable on room air, euvolemic - Echo shows normal LVEF 65 to 65%, no WMA, RV was normal  Acute metabolic encephalopathy -has been having confusion while here, family does not report any history of dementia and usually she is alert and oriented and appropriate.  Suspected due to pain medications, monitor postoperatively - Mental status is better  AKI versus CKD - Patient with creatinine elevation which may be secondary to an AKI versus CKD.  It is a difficult to differentiate as patient's last creatinine is over 1 year ago. 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  Placed on SSI.  CBGs are acceptable  CBG (last 3)  Recent Labs    01/05/24 1638 01/05/24 2116 01/06/24 0556  GLUCAP 118* 165* 116*    Lymphedema - left sided lymphedema.  Upper extremity Doppler negative for DVT  Gout -no acute flares  Scheduled Meds:  carbamazepine   200 mg Oral TID   carvedilol   25 mg Oral BID WC   cycloSPORINE   1 drop Both Eyes BID   docusate sodium  100 mg Oral BID   enoxaparin (LOVENOX) injection  40 mg Subcutaneous Q24H   hydrALAZINE   100 mg Oral BID   insulin aspart  0-6 Units Subcutaneous TID WC   NIFEdipine   90 mg Oral Daily   pravastatin   20 mg Oral Daily   Vitamin D (Ergocalciferol)  50,000 Units Oral Q Fri   Continuous Infusions:   PRN Meds:.acetaminophen **OR** acetaminophen, diphenhydrAMINE, HYDROmorphone (DILAUDID) injection, magnesium citrate, menthol **OR** phenol, methocarbamol **OR** methocarbamol (ROBAXIN) injection, metoCLOPramide **OR** metoCLOPramide (REGLAN) injection, ondansetron  **OR** ondansetron  (ZOFRAN ) IV, oxyCODONE, senna-docusate  Current Outpatient Medications  Medication Instructions   allopurinol  (ZYLOPRIM ) 100 MG tablet Take 1 tablet (100 mg total) by mouth in the morning for gout.   aspirin EC 325 mg, Oral, Daily   B Complex-C (B-COMPLEX WITH VITAMIN C) tablet 1 tablet, Daily   bimatoprost (LUMIGAN) 0.01 % SOLN 1 drop, Daily at bedtime   bisacodyl  (DULCOLAX) 5 MG EC tablet Take by mouth as directed per md.   carbamazepine  (TEGRETOL ) 200 MG tablet Take 1 tablet (200 mg total) by mouth 3 (three) times  daily for seizure.   carvedilol  (COREG ) 25 MG tablet TAKE ONE TABLET BY MOUTH TWICE DAILY Needs appointment for further refills   carvedilol  (COREG ) 25 MG tablet Take 1 tablet (25 mg total) by mouth 2 (two) times daily with food.   ciprofloxacin  (CIPRO ) 250 mg, Oral, Every 12 hours   colchicine 0.6 mg, Daily   cycloSPORINE  (RESTASIS ) 0.05  % ophthalmic emulsion 1 drop, Both Eyes, 2 times daily   furosemide  (LASIX ) 20 mg, Oral, Daily   gabapentin  (NEURONTIN ) 300 MG capsule Take 1 capsule (300 mg total) by mouth at night.   hydrALAZINE  (APRESOLINE ) 100 MG tablet Take 1 tablet (100 mg total) by mouth 2 (two) times daily, once in the morning and once at noon.   hydrALAZINE  (APRESOLINE ) 100 mg, 2 times daily   Lifitegrast  (XIIDRA ) 5 % SOLN Instill 1 drop into both eyes twice a day   losartan (COZAAR) 100 mg, Every evening   methocarbamol (ROBAXIN) 500 mg, Oral, Every 6 hours PRN   Multiple Vitamin (MULTIVITAMIN WITH MINERALS) TABS tablet 1 tablet, Daily   NIFEdipine  (ADALAT  CC) 90 MG 24 hr tablet Take 1 tablet (90 mg total) by mouth daily on an empty stomach.   NIFEdipine  (ADALAT  CC) 90 mg, Oral, Daily   oxyCODONE (OXY IR/ROXICODONE) 2.5-5 mg, Oral, Every 4 hours PRN   polyethylene glycol (GOLYTELY) 236 g solution Take as directed per doctor instructions.   pravastatin  (PRAVACHOL ) 20 mg, Oral, Daily   Probiotic Product (PROBIOTIC DAILY) CAPS 1 capsule, Daily   [START ON 01/09/2024] Vitamin D (Ergocalciferol) (DRISDOL) 50,000 Units, Oral, Every Fri    Diet Orders (From admission, onward)     Start     Ordered   01/01/24 1722  Diet Heart Room service appropriate? Yes; Fluid consistency: Thin  Diet effective now       Question Answer Comment  Room service appropriate? Yes   Fluid consistency: Thin      01/01/24 1721            DVT prophylaxis: enoxaparin (LOVENOX) injection 40 mg Start: 01/02/24 0800 SCDs Start: 01/01/24 1722 SCDs Start: 12/31/23 2005   Lab Results  Component Value Date   PLT 247 01/05/2024      Code Status: Full Code  Family Communication: No family at bedside  Status is: Inpatient Remains inpatient appropriate because: Severity of illness   Level of care: Telemetry Cardiac  Consultants:  Orthopedic surgery  Objective: Vitals:   01/05/24 1532 01/05/24 1945 01/05/24 2200 01/06/24 0350   BP: (!) 155/59 139/64 (!) 181/67 (!) 163/55  Pulse: (!) 102 69 71 70  Resp: 15  18   Temp: 97.9 F (36.6 C) 98.2 F (36.8 C)  98 F (36.7 C)  TempSrc: Oral     SpO2: 94% 96% 99% 100%  Height:        Intake/Output Summary (Last 24 hours) at 01/06/2024 1056 Last data filed at 01/06/2024 0900 Gross per 24 hour  Intake 840 ml  Output 650 ml  Net 190 ml   Wt Readings from Last 3 Encounters:  06/02/23 59 kg  04/22/23 60 kg  11/28/22 60.4 kg    Examination: Constitutional: NAD Eyes: lids and conjunctivae normal, no scleral icterus ENMT: mmm Neck: normal, supple Respiratory: clear to auscultation bilaterally, no wheezing, no crackles.  Cardiovascular: Regular rate and rhythm, no murmurs / rubs / gallops. No LE edema. Abdomen: soft, no distention, no tenderness. Bowel sounds positive.   Data Reviewed: I have independently reviewed following labs  and imaging studies   CBC Recent Labs  Lab 12/31/23 1731 01/01/24 0615 01/01/24 1811 01/02/24 0440 01/04/24 0308 01/05/24 0640  WBC 8.4 6.1 11.5* 7.0 6.6 6.8  HGB 11.2* 11.9* 10.9* 8.9* 8.1* 8.7*  HCT 34.7* 36.2 33.6* 27.2* 24.7* 27.0*  PLT 283 298 254 223 192 247  MCV 96.1 94.0 95.7 94.4 95.4 96.8  MCH 31.0 30.9 31.1 30.9 31.3 31.2  MCHC 32.3 32.9 32.4 32.7 32.8 32.2  RDW 14.2 14.3 14.4 14.3 14.4 14.4  LYMPHSABS 1.2  --   --   --   --   --   MONOABS 0.3  --   --   --   --   --   EOSABS 0.1  --   --   --   --   --   BASOSABS 0.0  --   --   --   --   --     Recent Labs  Lab 12/31/23 1731 12/31/23 1926 12/31/23 2220 01/01/24 0615 01/01/24 0951 01/01/24 1811 01/02/24 0440 01/04/24 0308 01/05/24 0640  NA 137  --   --  139  --   --  139 138 144  K 4.5  --   --  4.2  --   --  3.9 3.8 3.9  CL 101  --   --  100  --   --  103 105 109  CO2 21*  --   --  23  --   --  21* 23 22  GLUCOSE 143*  --   --  118*  --   --  102* 114* 129*  BUN 73*  --   --  71*  --   --  66* 65* 65*  CREATININE 1.53*  --   --  1.64*  --  1.52*  1.57* 1.33* 1.21*  CALCIUM 9.3  --   --  9.7  --   --  8.8* 9.0 9.5  AST 39  --   --   --   --   --  75* 93* 64*  ALT 33  --   --   --   --   --  41 43 42  ALKPHOS 133*  --   --   --   --   --  107 140* 163*  BILITOT 0.5  --   --   --   --   --  0.4 0.7 0.6  ALBUMIN 3.9  --   --   --   --   --  3.3* 2.8* 3.0*  MG  --   --  2.2  --   --   --  2.0 2.3 2.4  HGBA1C  --   --   --   --  5.5  --   --   --   --   BNP  --  477.2*  --   --   --   --   --   --   --     ------------------------------------------------------------------------------------------------------------------ No results for input(s): CHOL, HDL, LDLCALC, TRIG, CHOLHDL, LDLDIRECT in the last 72 hours.  Lab Results  Component Value Date   HGBA1C 5.5 01/01/2024   ------------------------------------------------------------------------------------------------------------------ No results for input(s): TSH, T4TOTAL, T3FREE, THYROIDAB in the last 72 hours.  Invalid input(s): FREET3  Cardiac Enzymes No results for input(s): CKMB, TROPONINI, MYOGLOBIN in the last 168 hours.  Invalid input(s): CK ------------------------------------------------------------------------------------------------------------------    Component Value Date/Time   BNP 477.2 (H) 12/31/2023 1926  CBG: Recent Labs  Lab 01/05/24 0737 01/05/24 1141 01/05/24 1638 01/05/24 2116 01/06/24 0556  GLUCAP 130* 87 118* 165* 116*    Recent Results (from the past 240 hours)  Surgical pcr screen     Status: None   Collection Time: 01/01/24  8:01 AM   Specimen: Nasal Mucosa; Nasal Swab  Result Value Ref Range Status   MRSA, PCR NEGATIVE NEGATIVE Final   Staphylococcus aureus NEGATIVE NEGATIVE Final    Comment: (NOTE) The Xpert SA Assay (FDA approved for NASAL specimens in patients 67 years of age and older), is one component of a comprehensive surveillance program. It is not intended to diagnose infection nor to guide  or monitor treatment. Performed at [facility], [address] [place], [place] [address]      Radiology Studies: No results found.    Nilda Fendt, MD, PhD [facility]  Between 7 am - 7 pm I am available, please contact me via Amion (for emergencies) or Securechat (non urgent messages)  Between 7 pm - 7 am I am not available, please contact night coverage MD/APP via Amion

## 2024-01-07 DIAGNOSIS — M25551 Pain in right hip: Secondary | ICD-10-CM | POA: Diagnosis not present

## 2024-01-07 DIAGNOSIS — S72001A Fracture of unspecified part of neck of right femur, initial encounter for closed fracture: Secondary | ICD-10-CM | POA: Diagnosis not present

## 2024-01-07 DIAGNOSIS — N183 Chronic kidney disease, stage 3 unspecified: Secondary | ICD-10-CM | POA: Diagnosis not present

## 2024-01-07 DIAGNOSIS — G8191 Hemiplegia, unspecified affecting right dominant side: Secondary | ICD-10-CM | POA: Diagnosis not present

## 2024-01-07 DIAGNOSIS — S72041D Displaced fracture of base of neck of right femur, subsequent encounter for closed fracture with routine healing: Secondary | ICD-10-CM | POA: Diagnosis not present

## 2024-01-07 DIAGNOSIS — S72001D Fracture of unspecified part of neck of right femur, subsequent encounter for closed fracture with routine healing: Secondary | ICD-10-CM | POA: Diagnosis not present

## 2024-01-07 DIAGNOSIS — G809 Cerebral palsy, unspecified: Secondary | ICD-10-CM | POA: Diagnosis not present

## 2024-01-07 DIAGNOSIS — R41841 Cognitive communication deficit: Secondary | ICD-10-CM | POA: Diagnosis not present

## 2024-01-07 DIAGNOSIS — E46 Unspecified protein-calorie malnutrition: Secondary | ICD-10-CM | POA: Diagnosis not present

## 2024-01-07 DIAGNOSIS — L89312 Pressure ulcer of right buttock, stage 2: Secondary | ICD-10-CM | POA: Diagnosis not present

## 2024-01-07 DIAGNOSIS — E119 Type 2 diabetes mellitus without complications: Secondary | ICD-10-CM | POA: Diagnosis not present

## 2024-01-07 DIAGNOSIS — L84 Corns and callosities: Secondary | ICD-10-CM | POA: Diagnosis not present

## 2024-01-07 DIAGNOSIS — E785 Hyperlipidemia, unspecified: Secondary | ICD-10-CM | POA: Diagnosis not present

## 2024-01-07 DIAGNOSIS — I5030 Unspecified diastolic (congestive) heart failure: Secondary | ICD-10-CM | POA: Diagnosis not present

## 2024-01-07 DIAGNOSIS — I10 Essential (primary) hypertension: Secondary | ICD-10-CM | POA: Diagnosis not present

## 2024-01-07 DIAGNOSIS — I509 Heart failure, unspecified: Secondary | ICD-10-CM | POA: Diagnosis not present

## 2024-01-07 DIAGNOSIS — Z7401 Bed confinement status: Secondary | ICD-10-CM | POA: Diagnosis not present

## 2024-01-07 DIAGNOSIS — L8995 Pressure ulcer of unspecified site, unstageable: Secondary | ICD-10-CM | POA: Diagnosis not present

## 2024-01-07 DIAGNOSIS — L89313 Pressure ulcer of right buttock, stage 3: Secondary | ICD-10-CM | POA: Diagnosis not present

## 2024-01-07 DIAGNOSIS — D509 Iron deficiency anemia, unspecified: Secondary | ICD-10-CM | POA: Diagnosis not present

## 2024-01-07 DIAGNOSIS — M1A9XX1 Chronic gout, unspecified, with tophus (tophi): Secondary | ICD-10-CM | POA: Diagnosis not present

## 2024-01-07 LAB — BASIC METABOLIC PANEL WITH GFR
Anion gap: 14 (ref 5–15)
BUN: 59 mg/dL — ABNORMAL HIGH (ref 8–23)
CO2: 20 mmol/L — ABNORMAL LOW (ref 22–32)
Calcium: 9.3 mg/dL (ref 8.9–10.3)
Chloride: 107 mmol/L (ref 98–111)
Creatinine, Ser: 1.12 mg/dL — ABNORMAL HIGH (ref 0.44–1.00)
GFR, Estimated: 50 mL/min — ABNORMAL LOW (ref >60)
Glucose, Bld: 160 mg/dL — ABNORMAL HIGH (ref 70–99)
Potassium: 4.1 mmol/L (ref 3.5–5.1)
Sodium: 141 mmol/L (ref 135–145)

## 2024-01-07 LAB — GLUCOSE, CAPILLARY
Glucose-Capillary: 107 mg/dL — ABNORMAL HIGH (ref 70–99)
Glucose-Capillary: 101 mg/dL — ABNORMAL HIGH (ref 70–99)
Glucose-Capillary: 185 mg/dL — ABNORMAL HIGH (ref 70–99)
Glucose-Capillary: 173 mg/dL — ABNORMAL HIGH (ref 70–99)

## 2024-01-07 LAB — CBC
HCT: 29.6 % — ABNORMAL LOW (ref 36.0–46.0)
Hemoglobin: 9.2 g/dL — ABNORMAL LOW (ref 12.0–15.0)
MCH: 30.4 pg (ref 26.0–34.0)
MCHC: 31.1 g/dL (ref 30.0–36.0)
MCV: 97.7 fL (ref 80.0–100.0)
Platelets: 272 K/uL (ref 150–400)
RBC: 3.03 MIL/uL — ABNORMAL LOW (ref 3.87–5.11)
RDW: 13.8 % (ref 11.5–15.5)
WBC: 6 K/uL (ref 4.0–10.5)
nRBC: 0 % (ref 0.0–0.2)

## 2024-01-07 MED ORDER — FLEET ENEMA RE ENEM
1.0000 | ENEMA | Freq: Once | RECTAL | Status: AC
  Administered 2024-01-07: 1 via RECTAL
  Filled 2024-01-07: qty 1

## 2024-01-07 MED ORDER — BISACODYL 10 MG RE SUPP
10.0000 mg | Freq: Once | RECTAL | Status: AC
  Administered 2024-01-07: 10 mg via RECTAL
  Filled 2024-01-07: qty 1

## 2024-01-07 NOTE — Progress Notes (Signed)
 Occupational Therapy Treatment Patient Details Name: Donna Becker MRN: [id number] DOB: 06-12-1945 Today's Date: 01/07/2024   History of present illness Donna Becker is an 78 y.o. female admitted 12/31/23 after ground level fall, sustaining a right displaced femoral neck fx. Pt s/p R THA 10/9. PMHx: CP with R hemiplegia, T2DM, HTN,  HFpEF, moderate aortic stenosis, arthritis, breast CA, and seizures.   OT comments  Pt is progressing towards goals. Pt engaged in bed mobility with Max A +2 to sit EOB for ~10 minutes. Pt unable to maintain trunk at midline position, demonstrating strong left lateral lean with minimal response to assist to move to midline position. Pt continues to require up to total A for ADL tasks. Pt provided with reorientation and improved response with ability to recall orientation to self and place this date. OT to continue per POC to facilitate progress towards goals. Patient will benefit from continued inpatient follow up therapy, <3 hours/day.       If plan is discharge home, recommend the following:  Two people to help with walking and/or transfers;Two people to help with bathing/dressing/bathroom;Assistance with cooking/housework;Assistance with feeding;Direct supervision/assist for medications management;Direct supervision/assist for financial management;Assist for transportation;Help with stairs or ramp for entrance;Supervision due to cognitive status   Equipment Recommendations  Other (comment) (defer until can confirm home living environment)    Recommendations for Other Services      Precautions / Restrictions Precautions Precautions: Fall Recall of Precautions/Restrictions: Impaired Precaution/Restrictions Comments: Direct anterior approach, no hip precautions. Restrictions Weight Bearing Restrictions Per Provider Order: Yes RLE Weight Bearing Per Provider Order: Weight bearing as tolerated       Mobility Bed Mobility Overal bed mobility: Needs  Assistance Bed Mobility: Supine to Sit, Sit to Supine, Rolling Rolling: Max assist, +2 for physical assistance   Supine to sit: Max assist, +2 for physical assistance, HOB elevated, Used rails Sit to supine: Max assist, +2 for physical assistance   General bed mobility comments: Pt guided in supine to sit transfer towards L side of bed with Max A +2 physical assistance and dense verbal cues with continous encouragement. Pt required Max A +2 to roll. Total A repositioning in bed.    Transfers Overall transfer level: Needs assistance                 General transfer comment: Pt declining transfer beyond supine to sit.     Balance Overall balance assessment: Needs assistance Sitting-balance support: Single extremity supported, Feet supported Sitting balance-Leahy Scale: Zero Sitting balance - Comments: Max A +2 needed to maintain sitting balance. Pt unable to bring body to midline this date demonstrating strong L lateral lean and propping on L arm. Therapist attempted Max A to bring body to midline but Pt with increased negative vocalizations and request to stop. Postural control: Posterior lean, Left lateral lean                                 ADL either performed or assessed with clinical judgement   ADL Overall ADL's : Needs assistance/impaired Eating/Feeding: Total assistance Eating/Feeding Details (indicate cue type and reason): Requires feeding assistance. Grooming: Maximal assistance                       Toileting- Clothing Manipulation and Hygiene: Total assistance              Extremity/Trunk Assessment Upper Extremity Assessment Upper  Extremity Assessment: RUE deficits/detail RUE Deficits / Details: Cerebral Palsy with R sided weakness RUE: Shoulder pain with ROM;Shoulder pain at rest RUE Coordination: decreased fine motor;decreased gross motor            Vision   Vision Assessment?: No apparent visual deficits   Perception      Praxis     Communication Communication Communication: No apparent difficulties   Cognition Arousal: Alert Behavior During Therapy: Anxious Cognition: Cognition impaired   Orientation impairments: Time, Situation Awareness: Intellectual awareness impaired, Online awareness impaired     Executive functioning impairment (select all impairments): Initiation, Organization, Reasoning, Sequencing, Problem solving OT - Cognition Comments: Pt oriented to location today, vebalizing she is at [facility].                 Following commands: Impaired Following commands impaired: Follows one step commands inconsistently      Cueing   Cueing Techniques: Verbal cues, Tactile cues, Visual cues  Exercises      Shoulder Instructions       General Comments Pt provided with reorientation during session to place and time. Pt encouraged to engage in OOB activity but Pt limited by anxiety and fear of pain. Pt required Max A +2 to roll in bed to adjust bed pad for comfort and skin integrity. Pt repositioned in bed to facilitate proper body alignment with body in midline and head properly supported.    Pertinent Vitals/ Pain       Pain Assessment Pain Assessment: PAINAD Breathing: occasional labored breathing, short period of hyperventilation Negative Vocalization: occasional moan/groan, low speech, negative/disapproving quality Facial Expression: sad, frightened, frown Body Language: rigid, fists clenched, knees up, pushing/pulling away, strikes out Consolability: distracted or reassured by voice/touch PAINAD Score: 6 Pain Location: R hip Pain Descriptors / Indicators: Grimacing, Guarding, Moaning Pain Intervention(s): Limited activity within patient's tolerance, Monitored during session  Home Living                                          Prior Functioning/Environment              Frequency  Min 2X/week        Progress Toward Goals  OT  Goals(current goals can now be found in the care plan section)  Progress towards OT goals: Progressing toward goals  Acute Rehab OT Goals Patient Stated Goal: Get warm OT Goal Formulation: Patient unable to participate in goal setting Time For Goal Achievement: 01/17/24 Potential to Achieve Goals: Fair ADL Goals Pt Will Perform Grooming: with min assist;sitting Pt Will Transfer to Toilet: with mod assist;bedside commode Additional ADL Goal #1: Pt will tolerate sitting EOB with Min A. for at least 10 minutes to address decreased activity tolerance. Additional ADL Goal #2: Pt will engage in bed mobility with Min A.  Plan      Co-evaluation                 AM-PAC OT 6 Clicks Daily Activity     Outcome Measure   Help from another person eating meals?: Total Help from another person taking care of personal grooming?: A Lot Help from another person toileting, which includes using toliet, bedpan, or urinal?: Total Help from another person bathing (including washing, rinsing, drying)?: A Lot Help from another person to put on and taking off regular upper body clothing?: A Lot Help from  another person to put on and taking off regular lower body clothing?: Total 6 Click Score: 9    End of Session    OT Visit Diagnosis: Unsteadiness on feet (R26.81);Muscle weakness (generalized) (M62.81);History of falling (Z91.81);Pain;Other symptoms and signs involving cognitive function Pain - Right/Left: Right Pain - part of body: Leg   Activity Tolerance Patient limited by pain   Patient Left in bed;with call bell/phone within reach;with bed alarm set;with nursing/sitter in room   Nurse Communication Other (comment);Mobility status (need for feeding assistance.)        Time: 8887-8868 OT Time Calculation (min): 19 min  Charges: OT General Charges $OT Visit: 1 Visit OT Treatments $Therapeutic Activity: 8-22 mins  Maurilio CROME, OTR/L.  [facility] Acute Rehabilitation  Office:  [phone]   Maurilio PARAS Leith Szafranski 01/07/2024, 12:41 PM

## 2024-01-07 NOTE — Progress Notes (Signed)
 Attempted to call report to [facility], no answer and left VM.

## 2024-01-07 NOTE — Progress Notes (Deleted)
 Attempted to call [facility] for report, left VM.

## 2024-01-07 NOTE — Plan of Care (Signed)
   Problem: Safety: Goal: Ability to remain free from injury will improve Outcome: Progressing

## 2024-01-07 NOTE — Discharge Summary (Signed)
 Physician Discharge Summary  Donna Becker FMW:[id number] DOB: December 25, 1945 DOA: 12/31/2023  PCP: Rexanne Ingle, MD  Admit date: 12/31/2023 Discharge date: 01/07/2024  Time spent: 40 minutes  Recommendations for Outpatient Follow-up:  Follow outpatient CBC/CMP  Follow with orthopedics outpatient - needs f/u with Dr. Kendal in 2 weeks Follow BP outpatient with resumption of BP meds Attention to renal function with resumption of arb/diuretic   Discharge Diagnoses:  Principal Problem:   Displaced fracture of neck of right femur (HCC) Active Problems:   Essential hypertension   Hyperlipidemia   T2DM (type 2 diabetes mellitus) (HCC)   Lymphedema of arm   Discharge Condition: stable  Diet recommendation: heart healthy   There were no vitals filed for this visit.  History of present illness:   78 year old female with HTN, HLD, DM2, seizure disorder, cerebral palsy with right-sided weakness, moderate AS who comes into the hospital after Donna Becker fall and right hip pain. She denies any loss of consciousness. Imaging in the ED showed right femoral neck fracture, orthopedic surgery was consulted and she was admitted to the hospital.  Now s/p right total hip arthroplasty.  Stable for discharge to SNF today.     Hospital Course:  Assessment and Plan:  Displaced right femoral neck fracture - Patient presented to the hospital with right femoral neck fracture status post fall.  Orthopedic surgery consulted, appreciate input, status post right THA on 10/10.   - PT recommends SNF - last ortho note 10/11, recommending WBAT, follow up with Dr. Kendal in 2 weeks - ASA for DVT ppx at discharge per orthopedics   CHF exacerbation - Pt with mild CHF exacerbation, slightly elevated BNP, received Lasix  x 1 on admission.  She remains comfortable on room air, euvolemic - Echo shows normal LVEF 65 to 65%, no WMA, RV was normal - resume home lasix , losartan at d/c.   Continue coreg .     Acute metabolic  encephalopathy -has been having confusion while here, family does not report any history of dementia and usually she is alert and oriented and appropriate.  Suspected due to pain medications, monitor postoperatively - Mental status is better   AKI on CKD IIIa - baseline creatinine appears to be around 1 (11/2022) Creatinine peaked to 1.64, now 1.1 on day of discharge Follow outpatient with resumption of diuretic/arb    Hypertension -continue hydralazine , Coreg , nifedipine  - resume lasix  and losartan at dc (follow Bps closely with resumption of meds)   Hyperlipidemia - Restart home statin   Type 2 diabetes - Repeat A1c at 5.5.   Lymphedema - left sided lymphedema.  Upper extremity Doppler negative for DVT   Gout -no acute flares  Constipation  Bowel regimen     Procedures: Right total hip arthroplasty for femoral neck fracture    Summary:    Right:  No evidence of thrombosis in the subclavian.    Left:  No evidence of deep vein thrombosis in the upper extremity. No evidence of  superficial vein thrombosis in the upper extremity.   Echo IMPRESSIONS     1. Left ventricular ejection fraction, by estimation, is 60 to 65%. The  left ventricle has normal function. The left ventricle has no regional  wall motion abnormalities. There is moderate left ventricular hypertrophy.  Left ventricular diastolic function   could not be evaluated.   2. Right ventricular systolic function is normal. The right ventricular  size is mildly enlarged. There is mildly elevated pulmonary artery  systolic pressure.   3.  Left atrial size was severely dilated.   4. Right atrial size was mildly dilated.   5. The mitral valve is degenerative. Mild mitral valve regurgitation. No  evidence of mitral stenosis. Severe mitral annular calcification.   6. Tricuspid valve regurgitation is mild to moderate.   7. The aortic valve is calcified. There is severe calcifcation of the  aortic valve. Aortic valve  regurgitation is not visualized. Moderate  aortic valve stenosis. Aortic valve mean gradient measures 21.0 mmHg.   8. The inferior vena cava is normal in size with greater than 50%  respiratory variability, suggesting right atrial pressure of 3 mmHg.   Comparison(s): No significant change from prior study.   Consultations: orthopedics  Discharge Exam: Vitals:   01/07/24 0400 01/07/24 0436  BP: (!) 147/66 (!) 141/55  Pulse: 95 64  Resp: 17 18  Temp: 98.1 F (36.7 C) 98.4 F (36.9 C)  SpO2: 100% 98%   C/o some right hip pain No other complaints Discussed with sister over phone  General: No acute distress. Cardiovascular: RRR Lungs: unlabored Abdomen: Soft, nontender, nondistended  Neurological: Alert. Moves all extremities 4. Cranial nerves II through XII grossly intact. Extremities: dressing to RLE   Discharge Instructions   Discharge Instructions     (HEART FAILURE PATIENTS) Call MD:  Anytime you have any of the following symptoms: 1) 3 pound weight gain in 24 hours or 5 pounds in 1 week 2) shortness of breath, with or without Donna Becker dry hacking cough 3) swelling in the hands, feet or stomach 4) if you have to sleep on extra pillows at night in order to breathe.   Complete by: As directed    Call MD for:  difficulty breathing, headache or visual disturbances   Complete by: As directed    Call MD for:  extreme fatigue   Complete by: As directed    Call MD for:  hives   Complete by: As directed    Call MD for:  persistant dizziness or light-headedness   Complete by: As directed    Call MD for:  persistant nausea and vomiting   Complete by: As directed    Call MD for:  redness, tenderness, or signs of infection (pain, swelling, redness, odor or green/yellow discharge around incision site)   Complete by: As directed    Call MD for:  severe uncontrolled pain   Complete by: As directed    Call MD for:  temperature >100.4   Complete by: As directed    Diet - low sodium  heart healthy   Complete by: As directed    Discharge instructions   Complete by: As directed    You were seen for Donna Becker right femoral neck fracture.  You've now had Donna Becker right total hip arthroplasty by orthopedics.  You had mild heart failure which is improved after IV lasix .  We're resuming your home blood pressure medicines at discharge.  Your blood pressures should be followed closely outpatient.    Return for new, recurrent, or worsening symptoms.  Please ask your PCP to request records from this hospitalization so they know what was done and what the next steps will be.   Discharge wound care:   Complete by: As directed    Per orthopedics   Increase activity slowly   Complete by: As directed       Allergies as of 01/07/2024       Reactions   Codeine Itching, Rash        Medication List  STOP taking these medications    ciprofloxacin  250 MG tablet Commonly known as: Cipro        TAKE these medications    allopurinol  100 MG tablet Commonly known as: ZYLOPRIM  Take 1 tablet (100 mg total) by mouth in the morning for gout.   aspirin EC 325 MG tablet Take 1 tablet (325 mg total) by mouth daily.   B-complex with vitamin C tablet Take 1 tablet by mouth daily.   bimatoprost 0.01 % Soln Commonly known as: LUMIGAN Place 1 drop into both eyes at bedtime.   bisacodyl  5 MG EC tablet Generic drug: bisacodyl  Take by mouth as directed per md.   carbamazepine  200 MG tablet Commonly known as: TEGRETOL  Take 1 tablet (200 mg total) by mouth 3 (three) times daily for seizure.   carvedilol  25 MG tablet Commonly known as: COREG  Take 1 tablet (25 mg total) by mouth 2 (two) times daily with food. What changed: Another medication with the same name was removed. Continue taking this medication, and follow the directions you see here.   colchicine 0.6 MG tablet Take 0.6 mg by mouth daily.   cycloSPORINE  0.05 % ophthalmic emulsion Commonly known as: RESTASIS  Place 1  drop into both eyes 2 (two) times daily.   furosemide  20 MG tablet Commonly known as: LASIX  Take 1 tablet (20 mg total) by mouth daily.   gabapentin  300 MG capsule Commonly known as: NEURONTIN  Take 1 capsule (300 mg total) by mouth at night.   hydrALAZINE  100 MG tablet Commonly known as: APRESOLINE  Take 1 tablet (100 mg total) by mouth 2 (two) times daily, once in the morning and once at noon. What changed: Another medication with the same name was removed. Continue taking this medication, and follow the directions you see here.   losartan 100 MG tablet Commonly known as: COZAAR Take 100 mg by mouth every evening.   methocarbamol 500 MG tablet Commonly known as: ROBAXIN Take 1 tablet (500 mg total) by mouth every 6 (six) hours as needed for muscle spasms.   multivitamin with minerals Tabs tablet Take 1 tablet by mouth daily.   NIFEdipine  90 MG 24 hr tablet Commonly known as: ADALAT  CC Take 1 tablet (90 mg total) by mouth daily on an empty stomach. What changed: Another medication with the same name was removed. Continue taking this medication, and follow the directions you see here.   oxyCODONE 5 MG immediate release tablet Commonly known as: Oxy IR/ROXICODONE Take 0.5-1 tablets (2.5-5 mg total) by mouth every 4 (four) hours as needed (2.5 mg pain score 4-6, 5 mg pain score 7-10).   PEG-3350/Electrolytes 236 g Solr Take as directed per doctor instructions. What changed:  how much to take how to take this when to take this reasons to take this   pravastatin  20 MG tablet Commonly known as: PRAVACHOL  Take 1 tablet (20 mg total) by mouth daily.   Probiotic Daily Caps Take 1 capsule by mouth daily.   Vitamin D (Ergocalciferol) 1.25 MG (50000 UNIT) Caps capsule Commonly known as: DRISDOL Take 1 capsule (50,000 Units total) by mouth every Friday. Start taking on: January 09, 2024   Xiidra  5 % Soln Generic drug: Lifitegrast  Instill 1 drop into both eyes twice Tj Kitchings day                Discharge Care Instructions  (From admission, onward)           Start     Ordered   01/07/24 0000  Discharge  wound care:       Comments: Per orthopedics   01/07/24 1213           Allergies  Allergen Reactions   Codeine Itching and Rash    Follow-up Information     Haddix, Franky SQUIBB, MD. Schedule an appointment as soon as possible for Donna Becker visit in 2 week(s).   Specialty: Orthopedic Surgery Why: for wound check and repeat x-rays Contact information: [address] [place] [address] [phone]                  The results of significant diagnostics from this hospitalization (including imaging, microbiology, ancillary and laboratory) are listed below for reference.    Significant Diagnostic Studies: VAS US  UPPER EXTREMITY VENOUS DUPLEX Result Date: 01/02/2024 UPPER VENOUS STUDY  Patient Name:  Donna Becker  Date of Exam:   01/02/2024 Medical Rec #: [id number]          Accession #:    [id number] Date of Birth: 09/01/1945          Patient Gender: F Patient Age:   48 years Exam Location:  [facility] Procedure:      VAS US  UPPER EXTREMITY VENOUS DUPLEX Referring Phys: [facility] --------------------------------------------------------------------------------  Indications: Swelling Risk Factors: Left breast cancer with retained lymphedema since the 1990's. Limitations: Poor ultrasound/tissue interface and musculoskeletal features. Comparison Study: Prior negative left UEV done 06/21/2010 Performing Technologist: Alberta Lis RVS  Examination Guidelines: Maressa Apollo complete evaluation includes B-mode imaging, spectral Doppler, color Doppler, and power Doppler as needed of all accessible portions of each vessel. Bilateral testing is considered an integral part of Marios Gaiser complete examination. Limited examinations for reoccurring indications may be performed as noted.  Right Findings: +----------+------------+---------+-----------+----------+-------+  RIGHT     CompressiblePhasicitySpontaneousPropertiesSummary +----------+------------+---------+-----------+----------+-------+ Subclavian               Yes       Yes                      +----------+------------+---------+-----------+----------+-------+  Left Findings: +----------+------------+---------+-----------+----------+-------+ LEFT      CompressiblePhasicitySpontaneousPropertiesSummary +----------+------------+---------+-----------+----------+-------+ IJV           Full       Yes       Yes                      +----------+------------+---------+-----------+----------+-------+ Subclavian               Yes       Yes                      +----------+------------+---------+-----------+----------+-------+ Axillary      Full       Yes       Yes                      +----------+------------+---------+-----------+----------+-------+ Brachial      Full       Yes       Yes                      +----------+------------+---------+-----------+----------+-------+ Radial        Full                                          +----------+------------+---------+-----------+----------+-------+ Ulnar  Full       Yes                                +----------+------------+---------+-----------+----------+-------+ Cephalic                 Yes       Yes                      +----------+------------+---------+-----------+----------+-------+ Basilic                  Yes       Yes                      +----------+------------+---------+-----------+----------+-------+  Summary:  Right: No evidence of thrombosis in the subclavian.  Left: No evidence of deep vein thrombosis in the upper extremity. No evidence of superficial vein thrombosis in the upper extremity.  *See table(s) above for measurements and observations.  Diagnosing physician: Gaile New MD Electronically signed by Gaile New MD on 01/02/2024 at 9:57:52 PM.    Final    ECHOCARDIOGRAM  COMPLETE Result Date: 01/02/2024    ECHOCARDIOGRAM REPORT   Patient Name:   Donna Becker Date of Exam: 01/02/2024 Medical Rec #:  [id number]         Height:       65.0 in Accession #:    [id number]        Weight:       130.0 lb Date of Birth:  March 24, 1946         BSA:          1.647 m Patient Age:    78 years          BP:           177/97 mmHg Patient Gender: F                 HR:           70 bpm. Exam Location:  Inpatient Procedure: 2D Echo, Intracardiac Opacification Agent, Cardiac Doppler, Color            Doppler and PEDOF (Both Spectral and Color Flow Doppler were utilized            during procedure). Indications:    Aortic Stenosis I35.0  History:        Patient has prior history of Echocardiogram examinations, most                 recent 10/08/2022. Hx of cancer, Aortic Valve Disease; Risk                 Factors:Hypertension, Dyslipidemia, Diabetes and Former Smoker.  Sonographer:    Koleen Popper RDCS Referring Phys: [id number] [facility] Frederic Tones MCCLUNG  Sonographer Comments: Image acquisition challenging due to patient body habitus. IMPRESSIONS  1. Left ventricular ejection fraction, by estimation, is 60 to 65%. The left ventricle has normal function. The left ventricle has no regional wall motion abnormalities. There is moderate left ventricular hypertrophy. Left ventricular diastolic function  could not be evaluated.  2. Right ventricular systolic function is normal. The right ventricular size is mildly enlarged. There is mildly elevated pulmonary artery systolic pressure.  3. Left atrial size was severely dilated.  4. Right atrial size was mildly dilated.  5. The mitral valve is degenerative. Mild mitral valve regurgitation. No evidence of mitral stenosis. Severe mitral annular calcification.  6. Tricuspid valve regurgitation is mild to moderate.  7. The aortic valve is calcified. There is severe calcifcation of the aortic valve. Aortic valve regurgitation is not visualized. Moderate aortic valve stenosis.  Aortic valve mean gradient measures 21.0 mmHg.  8. The inferior vena cava is normal in size with greater than 50% respiratory variability, suggesting right atrial pressure of 3 mmHg. Comparison(s): No significant change from prior study. FINDINGS  Left Ventricle: Left ventricular ejection fraction, by estimation, is 60 to 65%. The left ventricle has normal function. The left ventricle has no regional wall motion abnormalities. Definity  contrast agent was given IV to delineate the left ventricular  endocardial borders. The left ventricular internal cavity size was normal in size. There is moderate left ventricular hypertrophy. Left ventricular diastolic function could not be evaluated due to mitral annular calcification (moderate or greater). Left  ventricular diastolic function could not be evaluated. Right Ventricle: The right ventricular size is mildly enlarged. Right vetricular wall thickness was not well visualized. Right ventricular systolic function is normal. There is mildly elevated pulmonary artery systolic pressure. The tricuspid regurgitant  velocity is 2.94 m/s, and with an assumed right atrial pressure of 3 mmHg, the estimated right ventricular systolic pressure is 37.6 mmHg. Left Atrium: Left atrial size was severely dilated. Right Atrium: Right atrial size was mildly dilated. Pericardium: There is no evidence of pericardial effusion. Mitral Valve: The mitral valve is degenerative in appearance. There is moderate thickening of the mitral valve leaflet(s). Severe mitral annular calcification. Mild mitral valve regurgitation. No evidence of mitral valve stenosis. Tricuspid Valve: The tricuspid valve is normal in structure. Tricuspid valve regurgitation is mild to moderate. No evidence of tricuspid stenosis. Aortic Valve: The aortic valve is calcified. There is severe calcifcation of the aortic valve. Aortic valve regurgitation is not visualized. Moderate aortic stenosis is present. Aortic valve mean  gradient measures 21.0 mmHg. Aortic valve peak gradient measures 39.9 mmHg. Aortic valve area, by VTI measures 1.61 cm. Pulmonic Valve: The pulmonic valve was grossly normal. Pulmonic valve regurgitation is trivial. No evidence of pulmonic stenosis. Aorta: The aortic root, ascending aorta, aortic arch and descending aorta are all structurally normal, with no evidence of dilitation or obstruction. Venous: The inferior vena cava is normal in size with greater than 50% respiratory variability, suggesting right atrial pressure of 3 mmHg. IAS/Shunts: The atrial septum is grossly normal.  LEFT VENTRICLE PLAX 2D LVIDd:         4.80 cm   Diastology LVIDs:         3.30 cm   LV e' medial:    5.48 cm/s LV PW:         1.40 cm   LV E/e' medial:  25.9 LV IVS:        1.80 cm   LV e' lateral:   6.60 cm/s LVOT diam:     1.80 cm   LV E/e' lateral: 21.5 LV SV:         100 LV SV Index:   61 LVOT Area:     2.54 cm  RIGHT VENTRICLE             IVC RV Basal diam:  4.20 cm     IVC diam: 1.60 cm RV Mid diam:    3.30 cm RV S prime:     15.50 cm/s TAPSE (M-mode): 2.4 cm LEFT ATRIUM             Index        RIGHT  ATRIUM           Index LA diam:        4.50 cm 2.73 cm/m   RA Area:     14.90 cm LA Vol (A2C):   42.6 ml 25.86 ml/m  RA Volume:   36.20 ml  21.98 ml/m LA Vol (A4C):   81.6 ml 49.54 ml/m LA Biplane Vol: 61.1 ml 37.09 ml/m  AORTIC VALVE AV Area (Vmax):    1.51 cm AV Area (Vmean):   1.61 cm AV Area (VTI):     1.61 cm AV Vmax:           316.00 cm/s AV Vmean:          212.000 cm/s AV VTI:            0.621 m AV Peak Grad:      39.9 mmHg AV Mean Grad:      21.0 mmHg LVOT Vmax:         187.00 cm/s LVOT Vmean:        134.000 cm/s LVOT VTI:          0.392 m LVOT/AV VTI ratio: 0.63  AORTA Ao Root diam: 2.70 cm Ao Asc diam:  2.70 cm MITRAL VALVE                TRICUSPID VALVE MV Area (PHT): 3.53 cm     TR Peak grad:   34.6 mmHg MV Decel Time: 215 msec     TR Vmax:        294.00 cm/s MV E velocity: 142.00 cm/s MV Lawson Mahone velocity: 106.00  cm/s  SHUNTS MV E/Alain Deschene ratio:  1.34         Systemic VTI:  0.39 m                             Systemic Diam: 1.80 cm Shelda Bruckner MD Electronically signed by Shelda Bruckner MD Signature Date/Time: 01/02/2024/1:56:19 PM    Final    DG HIP UNILAT W OR W/O PELVIS 2-3 VIEWS RIGHT Result Date: 01/01/2024 CLINICAL DATA:  Right hip fracture post arthroplasty. EXAM: DG HIP (WITH OR WITHOUT PELVIS) 2-3V RIGHT COMPARISON:  12/31/2023 FINDINGS: Interval postoperative changes with placement of Rhandi Despain right total hip arthroplasty using non cemented components. Components appear well seated. No acute fracture or dislocation. Soft tissue gas is consistent with recent surgery. Mild degenerative changes in the left hip. IMPRESSION: Interval right total hip arthroplasty. Components appear well seated. Electronically Signed   By: Elsie Gravely M.D.   On: 01/01/2024 20:21   DG HIP UNILAT WITH PELVIS 2-3 VIEWS RIGHT Result Date: 01/01/2024 CLINICAL DATA:  Elective surgery. EXAM: DG HIP (WITH OR WITHOUT PELVIS) 2-3V RIGHT COMPARISON:  Radiograph yesterday FINDINGS: Eight fluoroscopic spot views of the pelvis and right hip obtained in the operating room. Sequential images during hip arthroplasty. Fluoroscopy time 21 seconds. Dose 1.56 mGy. IMPRESSION: Intraoperative fluoroscopy during right hip arthroplasty. Electronically Signed   By: Andrea Gasman M.D.   On: 01/01/2024 16:21   DG C-Arm 1-60 Min-No Report Result Date: 01/01/2024 Fluoroscopy was utilized by the requesting physician.  No radiographic interpretation.   DG C-Arm 1-60 Min-No Report Result Date: 01/01/2024 Fluoroscopy was utilized by the requesting physician.  No radiographic interpretation.   DG Knee Right Port Result Date: 12/31/2023 CLINICAL DATA:  Fall, right knee pain EXAM: PORTABLE RIGHT KNEE - 1-2 VIEW COMPARISON:  None Available. FINDINGS: The examination is  limited by suboptimal positioning and limited obliquity between images. No definite  acute fracture or dislocation. Mild patellofemoral degenerative arthritis. No effusion. Soft tissues are unremarkable. IMPRESSION: 1. Limited examination. No definite acute fracture or dislocation. Electronically Signed   By: Dorethia Molt M.D.   On: 12/31/2023 21:40   DG Chest 1 View Result Date: 12/31/2023 CLINICAL DATA:  Fall with right hip fracture. EXAM: CHEST  1 VIEW COMPARISON:  11/22/2022 FINDINGS: Cardiomegaly is stable. Unchanged mediastinal contours, aortic atherosclerosis. Unchanged vascular congestion. Right infrahilar atelectasis. No confluent consolidation. No pneumothorax or pleural effusion. Left axillary surgical clips. The bones are under mineralized. IMPRESSION: Cardiomegaly and vascular congestion. Right infrahilar atelectasis. Electronically Signed   By: Andrea Gasman M.D.   On: 12/31/2023 17:15   DG Hip Unilat W or Wo Pelvis 2-3 Views Right Result Date: 12/31/2023 CLINICAL DATA:  Status post fall with suspected hip fracture. EXAM: DG HIP (WITH OR WITHOUT PELVIS) 2-3V RIGHT COMPARISON:  None Available. FINDINGS: Technically limited due to positioning. Displaced right femoral neck fracture. Approximately 3 cm proximal migration of the femoral shaft. Femoral head is well seated. Bony pelvis including pubic rami are grossly intact. The bones are subjectively under mineralized. IMPRESSION: Displaced right femoral neck fracture. Electronically Signed   By: Andrea Gasman M.D.   On: 12/31/2023 17:14    Microbiology: Recent Results (from the past 240 hours)  Surgical pcr screen     Status: None   Collection Time: 01/01/24  8:01 AM   Specimen: Nasal Mucosa; Nasal Swab  Result Value Ref Range Status   MRSA, PCR NEGATIVE NEGATIVE Final   Staphylococcus aureus NEGATIVE NEGATIVE Final    Comment: (NOTE) The Xpert SA Assay (FDA approved for NASAL specimens in patients 63 years of age and older), is one component of Johnnie Goynes comprehensive surveillance program. It is not intended to  diagnose infection nor to guide or monitor treatment. Performed at [facility], [address] [place], [place] [address]      Labs: Basic Metabolic Panel: Recent Labs  Lab 12/31/23 2220 01/01/24 0615 01/01/24 1811 01/02/24 0440 01/04/24 0308 01/05/24 0640 01/07/24 0917  NA  --  139  --  139 138 144 141  K  --  4.2  --  3.9 3.8 3.9 4.1  CL  --  100  --  103 105 109 107  CO2  --  23  --  21* 23 22 20*  GLUCOSE  --  118*  --  102* 114* 129* 160*  BUN  --  71*  --  66* 65* 65* 59*  CREATININE  --  1.64* 1.52* 1.57* 1.33* 1.21* 1.12*  CALCIUM  --  9.7  --  8.8* 9.0 9.5 9.3  MG 2.2  --   --  2.0 2.3 2.4  --    Liver Function Tests: Recent Labs  Lab 12/31/23 1731 01/02/24 0440 01/04/24 0308 01/05/24 0640  AST 39 75* 93* 64*  ALT 33 41 43 42  ALKPHOS 133* 107 140* 163*  BILITOT 0.5 0.4 0.7 0.6  PROT 8.7* 7.1 6.9 7.5  ALBUMIN 3.9 3.3* 2.8* 3.0*   No results for input(s): LIPASE, AMYLASE in the last 168 hours. No results for input(s): AMMONIA in the last 168 hours. CBC: Recent Labs  Lab 12/31/23 1731 01/01/24 0615 01/01/24 1811 01/02/24 0440 01/04/24 0308 01/05/24 0640 01/07/24 1034  WBC 8.4   < > 11.5* 7.0 6.6 6.8 6.0  NEUTROABS 6.8  --   --   --   --   --   --  HGB 11.2*   < > 10.9* 8.9* 8.1* 8.7* 9.2*  HCT 34.7*   < > 33.6* 27.2* 24.7* 27.0* 29.6*  MCV 96.1   < > 95.7 94.4 95.4 96.8 97.7  PLT 283   < > 254 223 192 247 272   < > = values in this interval not displayed.   Cardiac Enzymes: No results for input(s): CKTOTAL, CKMB, CKMBINDEX, TROPONINI in the last 168 hours. BNP: BNP (last 3 results) Recent Labs    12/31/23 1926  BNP 477.2*    ProBNP (last 3 results) No results for input(s): PROBNP in the last 8760 hours.  CBG: Recent Labs  Lab 01/06/24 1500 01/06/24 2107 01/07/24 0623 01/07/24 0748 01/07/24 1138  GLUCAP 136* 143* 107* 101* 185*       Signed:  Meliton Monte MD.  [facility] 01/07/2024,  12:16 PM

## 2024-01-07 NOTE — TOC Progression Note (Addendum)
 Transition of Care [facility]) - Progression Note    Patient Details  Name: Donna Becker MRN: [id number] Date of Birth: 1945/05/03  [facility]) CM/SW Contact  [name], LCSW Phone Number: 01/07/2024, 9:56 AM  Clinical Narrative:   CSW confirmed with [facility] that they can receive pt today.  MD informed.  1400: Pt still needs BM prior to DC.  MD/RN aware.  Expected Discharge Plan: Skilled Nursing Facility Barriers to Discharge: Continued Medical Work up               Expected Discharge Plan and Services       Living arrangements for the past 2 months: Single Family Home                                       Social Drivers of Health (SDOH) Interventions SDOH Screenings   Food Insecurity: Patient Unable To Answer (01/01/2024)  Housing: Patient Unable To Answer (01/01/2024)  Tobacco Use: Medium Risk (01/01/2024)    Readmission Risk Interventions     No data to display

## 2024-01-07 NOTE — TOC Transition Note (Signed)
 Transition of Care [facility]) - Discharge Note   Patient Details  Name: Donna Becker MRN: [id number] Date of Birth: [birth year]  Transition of Care [facility]) CM/SW Contact:  [name], LCSW Phone Number: 01/07/2024, 3:16 PM   Clinical Narrative:   Pt discharging to [facility], room 504b.  RN call report to [phone].    PTAR called [phone].     Final next level of care: Skilled Nursing Facility Barriers to Discharge: Barriers Resolved   Patient Goals and CMS Choice Patient states their goals for this hospitalization and ongoing recovery are:: to get stronger CMS Medicare.gov Compare Post Acute Care list provided to:: Patient Represenative (must comment) Choice offered to / list presented to : Sibling      Discharge Placement              Patient chooses bed at: [facility] Patient to be transferred to facility by: ptar Name of family member notified: sister [place]nee Patient and family notified of of transfer: 01/07/24  Discharge Plan and Services Additional resources added to the After Visit Summary for                                       Social Drivers of Health (SDOH) Interventions SDOH Screenings   Food Insecurity: Patient Unable To Answer (01/01/2024)  Housing: Patient Unable To Answer (01/01/2024)  Tobacco Use: Medium Risk (01/01/2024)     Readmission Risk Interventions     No data to display

## 2024-01-08 DIAGNOSIS — D509 Iron deficiency anemia, unspecified: Secondary | ICD-10-CM | POA: Diagnosis not present

## 2024-01-08 DIAGNOSIS — I10 Essential (primary) hypertension: Secondary | ICD-10-CM | POA: Diagnosis not present

## 2024-01-08 DIAGNOSIS — I509 Heart failure, unspecified: Secondary | ICD-10-CM | POA: Diagnosis not present

## 2024-01-08 DIAGNOSIS — E119 Type 2 diabetes mellitus without complications: Secondary | ICD-10-CM | POA: Diagnosis not present

## 2024-01-08 DIAGNOSIS — N183 Chronic kidney disease, stage 3 unspecified: Secondary | ICD-10-CM | POA: Diagnosis not present

## 2024-01-08 DIAGNOSIS — S72001D Fracture of unspecified part of neck of right femur, subsequent encounter for closed fracture with routine healing: Secondary | ICD-10-CM | POA: Diagnosis not present

## 2024-01-08 DIAGNOSIS — L89312 Pressure ulcer of right buttock, stage 2: Secondary | ICD-10-CM | POA: Diagnosis not present

## 2024-01-08 DIAGNOSIS — L84 Corns and callosities: Secondary | ICD-10-CM | POA: Diagnosis not present

## 2024-01-12 DIAGNOSIS — L84 Corns and callosities: Secondary | ICD-10-CM | POA: Diagnosis not present

## 2024-01-12 DIAGNOSIS — L89312 Pressure ulcer of right buttock, stage 2: Secondary | ICD-10-CM | POA: Diagnosis not present

## 2024-01-12 DIAGNOSIS — E119 Type 2 diabetes mellitus without complications: Secondary | ICD-10-CM | POA: Diagnosis not present

## 2024-01-12 DIAGNOSIS — D509 Iron deficiency anemia, unspecified: Secondary | ICD-10-CM | POA: Diagnosis not present

## 2024-01-22 DIAGNOSIS — E119 Type 2 diabetes mellitus without complications: Secondary | ICD-10-CM | POA: Diagnosis not present

## 2024-01-22 DIAGNOSIS — D509 Iron deficiency anemia, unspecified: Secondary | ICD-10-CM | POA: Diagnosis not present

## 2024-01-22 DIAGNOSIS — L89313 Pressure ulcer of right buttock, stage 3: Secondary | ICD-10-CM | POA: Diagnosis not present

## 2024-01-22 DIAGNOSIS — L89312 Pressure ulcer of right buttock, stage 2: Secondary | ICD-10-CM | POA: Diagnosis not present

## 2024-01-22 DIAGNOSIS — L84 Corns and callosities: Secondary | ICD-10-CM | POA: Diagnosis not present

## 2024-01-23 DIAGNOSIS — E785 Hyperlipidemia, unspecified: Secondary | ICD-10-CM | POA: Diagnosis not present

## 2024-01-23 DIAGNOSIS — E119 Type 2 diabetes mellitus without complications: Secondary | ICD-10-CM | POA: Diagnosis not present

## 2024-01-23 DIAGNOSIS — I10 Essential (primary) hypertension: Secondary | ICD-10-CM | POA: Diagnosis not present

## 2024-01-26 DIAGNOSIS — S72041D Displaced fracture of base of neck of right femur, subsequent encounter for closed fracture with routine healing: Secondary | ICD-10-CM | POA: Diagnosis not present

## 2024-01-28 DIAGNOSIS — G40909 Epilepsy, unspecified, not intractable, without status epilepticus: Secondary | ICD-10-CM | POA: Diagnosis not present

## 2024-01-28 DIAGNOSIS — M109 Gout, unspecified: Secondary | ICD-10-CM | POA: Diagnosis not present

## 2024-01-28 DIAGNOSIS — E1122 Type 2 diabetes mellitus with diabetic chronic kidney disease: Secondary | ICD-10-CM | POA: Diagnosis not present

## 2024-01-28 DIAGNOSIS — I130 Hypertensive heart and chronic kidney disease with heart failure and stage 1 through stage 4 chronic kidney disease, or unspecified chronic kidney disease: Secondary | ICD-10-CM | POA: Diagnosis not present

## 2024-01-28 DIAGNOSIS — I509 Heart failure, unspecified: Secondary | ICD-10-CM | POA: Diagnosis not present

## 2024-01-28 DIAGNOSIS — L89312 Pressure ulcer of right buttock, stage 2: Secondary | ICD-10-CM | POA: Diagnosis not present

## 2024-01-28 DIAGNOSIS — S72001D Fracture of unspecified part of neck of right femur, subsequent encounter for closed fracture with routine healing: Secondary | ICD-10-CM | POA: Diagnosis not present

## 2024-01-28 DIAGNOSIS — N1831 Chronic kidney disease, stage 3a: Secondary | ICD-10-CM | POA: Diagnosis not present

## 2024-01-28 DIAGNOSIS — I890 Lymphedema, not elsewhere classified: Secondary | ICD-10-CM | POA: Diagnosis not present

## 2024-01-29 ENCOUNTER — Other Ambulatory Visit (HOSPITAL_COMMUNITY): Payer: Self-pay

## 2024-01-30 ENCOUNTER — Other Ambulatory Visit (HOSPITAL_COMMUNITY): Payer: Self-pay

## 2024-02-02 DIAGNOSIS — I10 Essential (primary) hypertension: Secondary | ICD-10-CM | POA: Diagnosis not present

## 2024-02-02 DIAGNOSIS — G809 Cerebral palsy, unspecified: Secondary | ICD-10-CM | POA: Diagnosis not present

## 2024-02-02 DIAGNOSIS — G40909 Epilepsy, unspecified, not intractable, without status epilepticus: Secondary | ICD-10-CM | POA: Diagnosis not present

## 2024-02-02 DIAGNOSIS — S72001A Fracture of unspecified part of neck of right femur, initial encounter for closed fracture: Secondary | ICD-10-CM | POA: Diagnosis not present

## 2024-02-02 DIAGNOSIS — D649 Anemia, unspecified: Secondary | ICD-10-CM | POA: Diagnosis not present

## 2024-02-03 DIAGNOSIS — S72001D Fracture of unspecified part of neck of right femur, subsequent encounter for closed fracture with routine healing: Secondary | ICD-10-CM | POA: Diagnosis not present

## 2024-02-03 DIAGNOSIS — I509 Heart failure, unspecified: Secondary | ICD-10-CM | POA: Diagnosis not present

## 2024-02-03 DIAGNOSIS — I890 Lymphedema, not elsewhere classified: Secondary | ICD-10-CM | POA: Diagnosis not present

## 2024-02-03 DIAGNOSIS — N1831 Chronic kidney disease, stage 3a: Secondary | ICD-10-CM | POA: Diagnosis not present

## 2024-02-03 DIAGNOSIS — L89312 Pressure ulcer of right buttock, stage 2: Secondary | ICD-10-CM | POA: Diagnosis not present

## 2024-02-03 DIAGNOSIS — E1122 Type 2 diabetes mellitus with diabetic chronic kidney disease: Secondary | ICD-10-CM | POA: Diagnosis not present

## 2024-02-03 DIAGNOSIS — M109 Gout, unspecified: Secondary | ICD-10-CM | POA: Diagnosis not present

## 2024-02-03 DIAGNOSIS — G40909 Epilepsy, unspecified, not intractable, without status epilepticus: Secondary | ICD-10-CM | POA: Diagnosis not present

## 2024-02-03 DIAGNOSIS — I130 Hypertensive heart and chronic kidney disease with heart failure and stage 1 through stage 4 chronic kidney disease, or unspecified chronic kidney disease: Secondary | ICD-10-CM | POA: Diagnosis not present

## 2024-02-05 ENCOUNTER — Other Ambulatory Visit (HOSPITAL_COMMUNITY): Payer: Self-pay

## 2024-02-05 ENCOUNTER — Other Ambulatory Visit: Payer: Self-pay

## 2024-02-06 DIAGNOSIS — N1831 Chronic kidney disease, stage 3a: Secondary | ICD-10-CM | POA: Diagnosis not present

## 2024-02-06 DIAGNOSIS — I890 Lymphedema, not elsewhere classified: Secondary | ICD-10-CM | POA: Diagnosis not present

## 2024-02-06 DIAGNOSIS — S72001D Fracture of unspecified part of neck of right femur, subsequent encounter for closed fracture with routine healing: Secondary | ICD-10-CM | POA: Diagnosis not present

## 2024-02-06 DIAGNOSIS — M109 Gout, unspecified: Secondary | ICD-10-CM | POA: Diagnosis not present

## 2024-02-06 DIAGNOSIS — I130 Hypertensive heart and chronic kidney disease with heart failure and stage 1 through stage 4 chronic kidney disease, or unspecified chronic kidney disease: Secondary | ICD-10-CM | POA: Diagnosis not present

## 2024-02-06 DIAGNOSIS — E1122 Type 2 diabetes mellitus with diabetic chronic kidney disease: Secondary | ICD-10-CM | POA: Diagnosis not present

## 2024-02-06 DIAGNOSIS — I509 Heart failure, unspecified: Secondary | ICD-10-CM | POA: Diagnosis not present

## 2024-02-06 DIAGNOSIS — L89312 Pressure ulcer of right buttock, stage 2: Secondary | ICD-10-CM | POA: Diagnosis not present

## 2024-02-06 DIAGNOSIS — G40909 Epilepsy, unspecified, not intractable, without status epilepticus: Secondary | ICD-10-CM | POA: Diagnosis not present

## 2024-02-09 DIAGNOSIS — E1122 Type 2 diabetes mellitus with diabetic chronic kidney disease: Secondary | ICD-10-CM | POA: Diagnosis not present

## 2024-02-09 DIAGNOSIS — M109 Gout, unspecified: Secondary | ICD-10-CM | POA: Diagnosis not present

## 2024-02-09 DIAGNOSIS — N1831 Chronic kidney disease, stage 3a: Secondary | ICD-10-CM | POA: Diagnosis not present

## 2024-02-09 DIAGNOSIS — I130 Hypertensive heart and chronic kidney disease with heart failure and stage 1 through stage 4 chronic kidney disease, or unspecified chronic kidney disease: Secondary | ICD-10-CM | POA: Diagnosis not present

## 2024-02-09 DIAGNOSIS — L89312 Pressure ulcer of right buttock, stage 2: Secondary | ICD-10-CM | POA: Diagnosis not present

## 2024-02-09 DIAGNOSIS — S72001D Fracture of unspecified part of neck of right femur, subsequent encounter for closed fracture with routine healing: Secondary | ICD-10-CM | POA: Diagnosis not present

## 2024-02-09 DIAGNOSIS — G40909 Epilepsy, unspecified, not intractable, without status epilepticus: Secondary | ICD-10-CM | POA: Diagnosis not present

## 2024-02-09 DIAGNOSIS — I509 Heart failure, unspecified: Secondary | ICD-10-CM | POA: Diagnosis not present

## 2024-02-09 DIAGNOSIS — I890 Lymphedema, not elsewhere classified: Secondary | ICD-10-CM | POA: Diagnosis not present

## 2024-02-11 ENCOUNTER — Other Ambulatory Visit (HOSPITAL_COMMUNITY): Payer: Self-pay

## 2024-02-11 ENCOUNTER — Other Ambulatory Visit: Payer: Self-pay

## 2024-02-11 ENCOUNTER — Encounter: Payer: Self-pay | Admitting: Pharmacist

## 2024-02-12 ENCOUNTER — Other Ambulatory Visit: Payer: Self-pay

## 2024-02-12 DIAGNOSIS — L89312 Pressure ulcer of right buttock, stage 2: Secondary | ICD-10-CM | POA: Diagnosis not present

## 2024-02-17 ENCOUNTER — Other Ambulatory Visit: Payer: Self-pay

## 2024-02-17 DIAGNOSIS — S72001D Fracture of unspecified part of neck of right femur, subsequent encounter for closed fracture with routine healing: Secondary | ICD-10-CM | POA: Diagnosis not present

## 2024-02-17 DIAGNOSIS — I130 Hypertensive heart and chronic kidney disease with heart failure and stage 1 through stage 4 chronic kidney disease, or unspecified chronic kidney disease: Secondary | ICD-10-CM | POA: Diagnosis not present

## 2024-02-17 DIAGNOSIS — E1122 Type 2 diabetes mellitus with diabetic chronic kidney disease: Secondary | ICD-10-CM | POA: Diagnosis not present

## 2024-02-17 DIAGNOSIS — M109 Gout, unspecified: Secondary | ICD-10-CM | POA: Diagnosis not present

## 2024-02-17 DIAGNOSIS — G40909 Epilepsy, unspecified, not intractable, without status epilepticus: Secondary | ICD-10-CM | POA: Diagnosis not present

## 2024-02-17 DIAGNOSIS — I890 Lymphedema, not elsewhere classified: Secondary | ICD-10-CM | POA: Diagnosis not present

## 2024-02-17 DIAGNOSIS — L89312 Pressure ulcer of right buttock, stage 2: Secondary | ICD-10-CM | POA: Diagnosis not present

## 2024-02-17 DIAGNOSIS — N1831 Chronic kidney disease, stage 3a: Secondary | ICD-10-CM | POA: Diagnosis not present

## 2024-02-17 DIAGNOSIS — I509 Heart failure, unspecified: Secondary | ICD-10-CM | POA: Diagnosis not present

## 2024-02-18 ENCOUNTER — Other Ambulatory Visit: Payer: Self-pay

## 2024-02-18 DIAGNOSIS — I509 Heart failure, unspecified: Secondary | ICD-10-CM | POA: Diagnosis not present

## 2024-02-18 DIAGNOSIS — I130 Hypertensive heart and chronic kidney disease with heart failure and stage 1 through stage 4 chronic kidney disease, or unspecified chronic kidney disease: Secondary | ICD-10-CM | POA: Diagnosis not present

## 2024-02-23 DIAGNOSIS — S72041D Displaced fracture of base of neck of right femur, subsequent encounter for closed fracture with routine healing: Secondary | ICD-10-CM | POA: Diagnosis not present

## 2024-02-24 DIAGNOSIS — L89312 Pressure ulcer of right buttock, stage 2: Secondary | ICD-10-CM | POA: Diagnosis not present

## 2024-02-24 DIAGNOSIS — E1122 Type 2 diabetes mellitus with diabetic chronic kidney disease: Secondary | ICD-10-CM | POA: Diagnosis not present

## 2024-02-24 DIAGNOSIS — G40909 Epilepsy, unspecified, not intractable, without status epilepticus: Secondary | ICD-10-CM | POA: Diagnosis not present

## 2024-02-24 DIAGNOSIS — I890 Lymphedema, not elsewhere classified: Secondary | ICD-10-CM | POA: Diagnosis not present

## 2024-02-24 DIAGNOSIS — S72001D Fracture of unspecified part of neck of right femur, subsequent encounter for closed fracture with routine healing: Secondary | ICD-10-CM | POA: Diagnosis not present

## 2024-02-24 DIAGNOSIS — I130 Hypertensive heart and chronic kidney disease with heart failure and stage 1 through stage 4 chronic kidney disease, or unspecified chronic kidney disease: Secondary | ICD-10-CM | POA: Diagnosis not present

## 2024-02-24 DIAGNOSIS — N1831 Chronic kidney disease, stage 3a: Secondary | ICD-10-CM | POA: Diagnosis not present

## 2024-02-24 DIAGNOSIS — M109 Gout, unspecified: Secondary | ICD-10-CM | POA: Diagnosis not present

## 2024-02-24 DIAGNOSIS — I509 Heart failure, unspecified: Secondary | ICD-10-CM | POA: Diagnosis not present

## 2024-03-04 ENCOUNTER — Other Ambulatory Visit: Payer: Self-pay

## 2024-04-03 ENCOUNTER — Other Ambulatory Visit: Payer: Self-pay

## 2024-04-03 ENCOUNTER — Other Ambulatory Visit (HOSPITAL_COMMUNITY): Payer: Self-pay

## 2024-04-05 ENCOUNTER — Other Ambulatory Visit (HOSPITAL_COMMUNITY): Payer: Self-pay

## 2024-04-05 ENCOUNTER — Other Ambulatory Visit: Payer: Self-pay
